# Patient Record
Sex: Female | Born: 1935 | Race: White | Hispanic: No | Marital: Married | Smoking: Never smoker
Health system: Southern US, Community
[De-identification: ages and names within clinical notes are randomized; demographics above are authoritative.]

## PROBLEM LIST (undated history)

## (undated) DIAGNOSIS — R011 Cardiac murmur, unspecified: Secondary | ICD-10-CM

## (undated) DIAGNOSIS — I10 Essential (primary) hypertension: Secondary | ICD-10-CM

## (undated) DIAGNOSIS — E7800 Pure hypercholesterolemia, unspecified: Secondary | ICD-10-CM

## (undated) HISTORY — PX: TOTAL HIP ARTHROPLASTY: SHX124

---

## 2004-09-17 ENCOUNTER — Ambulatory Visit: Payer: Self-pay | Admitting: Internal Medicine

## 2005-09-28 ENCOUNTER — Ambulatory Visit: Payer: Self-pay | Admitting: Internal Medicine

## 2006-10-03 ENCOUNTER — Ambulatory Visit: Payer: Self-pay | Admitting: Internal Medicine

## 2006-10-05 ENCOUNTER — Ambulatory Visit: Payer: Self-pay | Admitting: Internal Medicine

## 2007-05-21 ENCOUNTER — Encounter: Admission: RE | Admit: 2007-05-21 | Discharge: 2007-05-21 | Payer: Self-pay | Admitting: Internal Medicine

## 2007-06-15 HISTORY — PX: TOTAL HIP ARTHROPLASTY: SHX124

## 2007-08-07 ENCOUNTER — Ambulatory Visit: Payer: Self-pay | Admitting: Pain Medicine

## 2007-08-22 ENCOUNTER — Ambulatory Visit: Payer: Self-pay | Admitting: Pain Medicine

## 2007-09-12 ENCOUNTER — Ambulatory Visit: Payer: Self-pay | Admitting: Physician Assistant

## 2007-10-06 ENCOUNTER — Ambulatory Visit: Payer: Self-pay | Admitting: Internal Medicine

## 2008-03-11 ENCOUNTER — Ambulatory Visit: Payer: Self-pay | Admitting: General Practice

## 2008-03-11 ENCOUNTER — Other Ambulatory Visit: Payer: Self-pay

## 2008-03-25 ENCOUNTER — Inpatient Hospital Stay: Payer: Self-pay | Admitting: General Practice

## 2008-10-15 ENCOUNTER — Ambulatory Visit: Payer: Self-pay | Admitting: Internal Medicine

## 2009-10-17 ENCOUNTER — Ambulatory Visit: Payer: Self-pay | Admitting: Internal Medicine

## 2011-02-11 ENCOUNTER — Ambulatory Visit: Payer: Self-pay | Admitting: Internal Medicine

## 2012-03-14 ENCOUNTER — Ambulatory Visit: Payer: Self-pay | Admitting: Internal Medicine

## 2012-07-09 ENCOUNTER — Emergency Department: Payer: Self-pay | Admitting: Unknown Physician Specialty

## 2014-04-22 DIAGNOSIS — Z96641 Presence of right artificial hip joint: Secondary | ICD-10-CM | POA: Insufficient documentation

## 2015-12-25 ENCOUNTER — Emergency Department
Admission: EM | Admit: 2015-12-25 | Discharge: 2015-12-25 | Disposition: A | Discharge status: Home / Self Care | Payer: Medicare Other | Attending: Emergency Medicine | Admitting: Emergency Medicine

## 2015-12-25 ENCOUNTER — Encounter: Payer: Self-pay | Admitting: *Deleted

## 2015-12-25 ENCOUNTER — Emergency Department: Payer: Medicare Other

## 2015-12-25 DIAGNOSIS — W01198A Fall on same level from slipping, tripping and stumbling with subsequent striking against other object, initial encounter: Secondary | ICD-10-CM | POA: Diagnosis not present

## 2015-12-25 DIAGNOSIS — Y999 Unspecified external cause status: Secondary | ICD-10-CM | POA: Insufficient documentation

## 2015-12-25 DIAGNOSIS — Z79899 Other long term (current) drug therapy: Secondary | ICD-10-CM | POA: Insufficient documentation

## 2015-12-25 DIAGNOSIS — S0093XA Contusion of unspecified part of head, initial encounter: Secondary | ICD-10-CM

## 2015-12-25 DIAGNOSIS — Y929 Unspecified place or not applicable: Secondary | ICD-10-CM | POA: Diagnosis not present

## 2015-12-25 DIAGNOSIS — W19XXXA Unspecified fall, initial encounter: Secondary | ICD-10-CM

## 2015-12-25 DIAGNOSIS — S0990XA Unspecified injury of head, initial encounter: Secondary | ICD-10-CM

## 2015-12-25 DIAGNOSIS — S80212A Abrasion, left knee, initial encounter: Secondary | ICD-10-CM | POA: Diagnosis not present

## 2015-12-25 DIAGNOSIS — I10 Essential (primary) hypertension: Secondary | ICD-10-CM | POA: Insufficient documentation

## 2015-12-25 DIAGNOSIS — Z7982 Long term (current) use of aspirin: Secondary | ICD-10-CM | POA: Insufficient documentation

## 2015-12-25 DIAGNOSIS — Y9389 Activity, other specified: Secondary | ICD-10-CM | POA: Insufficient documentation

## 2015-12-25 HISTORY — DX: Essential (primary) hypertension: I10

## 2015-12-25 HISTORY — DX: Pure hypercholesterolemia, unspecified: E78.00

## 2015-12-25 NOTE — ED Notes (Signed)
States she is on 81 mg ASA daily

## 2015-12-25 NOTE — Discharge Instructions (Signed)
Abrasion An abrasion is a cut or scrape on the surface of your skin. An abrasion does not go through all of the layers of your skin. It is important to take good care of your abrasion to prevent infection. HOME CARE Medicines  Take or apply medicines only as told by your doctor.  If you were prescribed an antibiotic ointment, finish all of it even if you start to feel better. Wound Care  Clean the wound with mild soap and water 2-3 times per day or as told by your doctor. Pat your wound dry with a clean towel. Do not rub it.  There are many ways to close and cover a wound. Follow instructions from your doctor about:  How to take care of your wound.  When and how you should change your bandage (dressing).  When and how you should take off your dressing.  Check your wound every day for signs of infection. Watch for:  Redness, swelling, or pain.  Fluid, blood, or pus. General Instructions  Keep the dressing dry as told by your doctor. Do not take baths, swim, use a hot tub, or do anything that would put your wound underwater until your doctor says it is okay.  If there is swelling, raise (elevate) the injured area above the level of your heart while you are sitting or lying down.  Keep all follow-up visits as told by your doctor. This is important. GET HELP IF:  You were given a tetanus shot and you have any of these where the needle went in:  Swelling.  Very bad pain.  Redness.  Bleeding.  Medicine does not help your pain.  You have any of these at the site of the wound:  More redness.  More swelling.  More pain. GET HELP RIGHT AWAY IF:  You have a red streak going away from your wound.  You have a fever.  You have fluid, blood, or pus coming from your wound.  There is a bad smell coming from your wound.   This information is not intended to replace advice given to you by your health care provider. Make sure you discuss any questions you have with your  health care provider.   Document Released: 11/17/2007 Document Revised: 10/15/2014 Document Reviewed: 05/29/2014 Elsevier Interactive Patient Education 2016 [employer]  Contusion A contusion is a deep bruise. Contusions happen when an injury causes bleeding under the skin. Symptoms of bruising include pain, swelling, and discolored skin. The skin may turn blue, purple, or yellow. HOME CARE   Rest the injured area.  If told, put ice on the injured area.  Put ice in a plastic bag.  Place a towel between your skin and the bag.  Leave the ice on for 20 minutes, 2-3 times per day.  If told, put light pressure (compression) on the injured area using an elastic bandage. Make sure the bandage is not too tight. Remove it and put it back on as told by your doctor.  If possible, raise (elevate) the injured area above the level of your heart while you are sitting or lying down.  Take over-the-counter and prescription medicines only as told by your doctor. GET HELP IF:  Your symptoms do not get better after several days of treatment.  Your symptoms get worse.  You have trouble moving the injured area. GET HELP RIGHT AWAY IF:   You have very bad pain.  You have a loss of feeling (numbness) in a hand or foot.  Your hand or foot turns pale or cold.   This information is not intended to replace advice given to you by your health care provider. Make sure you discuss any questions you have with your health care provider.   Document Released: 11/17/2007 Document Revised: 02/19/2015 Document Reviewed: 10/16/2014 Elsevier Interactive Patient Education 2016 [employer]  Facial or Scalp Contusion  A facial or scalp contusion is a deep bruise on the face or head. Contusions happen when an injury causes bleeding under the skin. Signs of bruising include pain, puffiness (swelling), and discolored skin. The contusion may turn blue, purple, or yellow. HOME CARE  Only take medicines as told  by your doctor.  Put ice on the injured area.  Put ice in a plastic bag.  Place a towel between your skin and the bag.  Leave the ice on for 20 minutes, 2-3 times a day. GET HELP IF:  You have bite problems.  You have pain when chewing.  You are worried about your face not healing normally. GET HELP RIGHT AWAY IF:   You have severe pain or a headache and medicine does not help.  You are very tired or confused, or your personality changes.  You throw up (vomit).  You have a nosebleed that will not stop.  You see two of everything (double vision) or have blurry vision.  You have fluid coming from your nose or ear.  You have problems walking or using your arms or legs. MAKE SURE YOU:   Understand these instructions.  Will watch your condition.  Will get help right away if you are not doing well or get worse.   This information is not intended to replace advice given to you by your health care provider. Make sure you discuss any questions you have with your health care provider.   Document Released: 05/20/2011 Document Revised: 06/21/2014 Document Reviewed: 01/11/2013 Elsevier Interactive Patient Education 2016 [employer]  Cryotherapy Cryotherapy is when you put ice on your injury. Ice helps lessen pain and puffiness (swelling) after an injury. Ice works the best when you start using it in the first 24 to 48 hours after an injury. HOME CARE  Put a dry or damp towel between the ice pack and your skin.  You may press gently on the ice pack.  Leave the ice on for no more than 10 to 20 minutes at a time.  Check your skin after 5 minutes to make sure your skin is okay.  Rest at least 20 minutes between ice pack uses.  Stop using ice when your skin loses feeling (numbness).  Do not use ice on someone who cannot tell you when it hurts. This includes small children and people with memory problems (dementia). GET HELP RIGHT AWAY IF:  You have white spots on your  skin.  Your skin turns blue or pale.  Your skin feels waxy or hard.  Your puffiness gets worse. MAKE SURE YOU:   Understand these instructions.  Will watch your condition.  Will get help right away if you are not doing well or get worse.   This information is not intended to replace advice given to you by your health care provider. Make sure you discuss any questions you have with your health care provider.   Document Released: 11/17/2007 Document Revised: 08/23/2011 Document Reviewed: 01/21/2011 Elsevier Interactive Patient Education 2016 [employer]  Head Injury, Adult You have a head injury. Headaches and throwing up (vomiting) are common after a head injury.  It should be easy to wake up from sleeping. Sometimes you must stay in the hospital. Most problems happen within the first 24 hours. Side effects may occur up to 7-10 days after the injury.  WHAT ARE THE TYPES OF HEAD INJURIES? Head injuries can be as minor as a bump. Some head injuries can be more severe. More severe head injuries include:  A jarring injury to the brain (concussion).  A bruise of the brain (contusion). This mean there is bleeding in the brain that can cause swelling.  A cracked skull (skull fracture).  Bleeding in the brain that collects, clots, and forms a bump (hematoma). WHEN SHOULD I GET HELP RIGHT AWAY?   You are confused or sleepy.  You cannot be woken up.  You feel sick to your stomach (nauseous) or keep throwing up (vomiting).  Your dizziness or unsteadiness is getting worse.  You have very bad, lasting headaches that are not helped by medicine. Take medicines only as told by your doctor.  You cannot use your arms or legs like normal.  You cannot walk.  You notice changes in the black spots in the center of the colored part of your eye (pupil).  You have clear or bloody fluid coming from your nose or ears.  You have trouble seeing. During the next 24 hours after the injury, you  must stay with someone who can watch you. This person should get help right away (call 911 in the [place] if you start to shake and are not able to control it (have seizures), you pass out, or you are unable to wake up. HOW CAN I PREVENT A HEAD INJURY IN THE FUTURE?  Wear seat belts.  Wear a helmet while bike riding and playing sports like football.  Stay away from dangerous activities around the house. WHEN CAN I RETURN TO NORMAL ACTIVITIES AND ATHLETICS? See your doctor before doing these activities. You should not do normal activities or play contact sports until 1 week after the following symptoms have stopped:  Headache that does not go away.  Dizziness.  Poor attention.  Confusion.  Memory problems.  Sickness to your stomach or throwing up.  Tiredness.  Fussiness.  Bothered by bright lights or loud noises.  Anxiousness or depression.  Restless sleep. MAKE SURE YOU:   Understand these instructions.  Will watch your condition.  Will get help right away if you are not doing well or get worse.   This information is not intended to replace advice given to you by your health care provider. Make sure you discuss any questions you have with your health care provider.   Document Released: 05/13/2008 Document Revised: 06/21/2014 Document Reviewed: 02/05/2013 Elsevier Interactive Patient Education [employer]

## 2015-12-25 NOTE — ED Notes (Signed)
See triage note. States she tripped and fell hit head on door frame  No  Loc  Small hematoma noted   Also abrasion noted to left knee

## 2015-12-25 NOTE — ED Provider Notes (Signed)
[facility] Emergency Department Provider Note  ____________________________________________  Time seen: Approximately 10:49 AM  I have reviewed the triage vital signs and the nursing notes.   HISTORY  Chief Complaint Fall and Head Injury    HPI Judy [name] is a 82 y.o. female , NAD, presents to the emergency department accompanied by her husband who assists with history. Patient states she tripped over some objects in the floor, fell backwards and struck the door frame with the top of her head. States she fell on her left side has had some left hip pain along with some left knee pain with an abrasion. 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 She was able to get up from the ground with some assistance and has been able to ambulate with little pain. Denies any neck or back pain. Has not had any saddle paresthesias or loss of bowel or bladder control. Denies LOC, dizziness, visual changes, chest pain, palpitations, shortness of breath, abdominal pain, nausea, vomiting. Has not noted any active bleeding or open wounds. States she did take Advil prior to eating her breakfast this morning for aching in her hips which can be a common occurrence.   Past Medical History  Diagnosis Date  . Hypertension   . High cholesterol     There are no active problems to display for this patient.   Past Surgical History  Procedure Laterality Date  . Total hip arthroplasty      Current Outpatient Rx  Name  Route  Sig  Dispense  Refill  . aspirin 81 MG tablet   Oral   Take 81 mg by mouth daily.         . benzonatate (TESSALON) 100 MG capsule   Oral   Take 100 mg by mouth 3 (three) times daily as needed for cough.         . loratadine (CLARITIN) 10 MG tablet   Oral   Take 10 mg by mouth daily.         . metoprolol succinate (TOPROL-XL) 25 MG 24 hr tablet   Oral   Take 25 mg  by mouth daily.         Marland Kitchen oxybutynin (DITROPAN) 5 MG tablet   Oral   Take 5 mg by mouth 3 (three) times daily.         . pantoprazole (PROTONIX) 40 MG tablet   Oral   Take 40 mg by mouth daily.         . simvastatin (ZOCOR) 20 MG tablet   Oral   Take 20 mg by mouth daily.         . valsartan-hydrochlorothiazide (DIOVAN-HCT) 80-12.5 MG tablet   Oral   Take 1 tablet by mouth daily.         . Vitamin D, Ergocalciferol, (DRISDOL) 50000 units CAPS capsule   Oral   Take 50,000 Units by mouth every 7 (seven) days.           Allergies Review of patient's allergies indicates no known allergies.  History reviewed. No pertinent family history.  Social History Social History  Substance Use Topics  . Smoking status: Never Smoker   . Smokeless tobacco: None  . Alcohol Use: No     Review of Systems  Constitutional: No fever/chills, fatigue Eyes: No visual changes.  Cardiovascular: No chest pain, Palpitations. Respiratory: No shortness of breath. No  wheezing.  Gastrointestinal: No abdominal pain.  No nausea, vomiting.   Musculoskeletal: Positive bilateral aching in hips, left knee pain. Negative for back, Neck pain.  Skin: Contusion head. Negative for rash. Neurological: Positive head injury. Negative for headaches, focal weakness or numbness. No LOC, dizziness. No tingling, saddle paresthesias, loss of bowel or bladder control 10-point ROS otherwise negative.  ____________________________________________   PHYSICAL EXAM:  VITAL SIGNS: ED Triage Vitals  Enc Vitals Group     BP 12/25/15 1031 154/102 mmHg     Pulse Rate 12/25/15 1031 93     Resp 12/25/15 1031 18     Temp 12/25/15 1031 97.5 F (36.4 C)     Temp Source 12/25/15 1031 Oral     SpO2 12/25/15 1031 95 %     Weight 12/25/15 1031 160 lb (72.576 kg)     Height 12/25/15 1031 5\' 7"  (1.702 m)     Head Cir --      Peak Flow --      Pain Score --      Pain Loc --      Pain Edu? --      Excl. in GC?  --      Constitutional: Alert and oriented. Well appearing and in no acute distress. Eyes: Conjunctivae are normalWithout icterus or injection. PERRLA. EOMI without pain.  Head: Normocephalic. ENT:      Ears: No discharge noted from bilateral ear canals.      Nose: No congestion/rhinnorhea.      Mouth/Throat: Mucous membranes are moist.  Neck: No cervical spine tenderness to palpation. Supple with full range of motion. Hematological/Lymphatic/Immunilogical: No cervical lymphadenopathy. Cardiovascular: Normal rate, regular rhythm. Grossly normal heart sounds. Good peripheral circulation with 2+ pulses noted in bilateral upper and lower extremities. Respiratory: Normal respiratory effort without tachypnea or retractions. Lungs CTAB with breath sounds noted in all lung fields. Musculoskeletal: Full range of motion of the left knee without significant pain. No laxity with anterior or posterior drawer. Grossly normal range of motion of bilateral hips without pain. No lower extremity tenderness nor edema.  No joint effusions. Neurologic:  Normal speech/language. No gross focal neurologic deficits are appreciated. CN III-XII grossly in tact.  Skin:  Superficial abrasion noted to the anterior left knee with out active bleeding. Contusion to the top of the scalp with congruent oblong hematoma without any open wounds or lacerations. All tenderness to palpation. Skin is warm, dry and intact. No rash noted. Psychiatric: Mood and affect are normal. Speech and behavior are normal. Patient exhibits appropriate insight and judgement.   ____________________________________________   LABS  None ____________________________________________  EKG  None ____________________________________________  RADIOLOGY I have personally viewed and evaluated these images (plain radiographs) as part of my medical decision making, as well as reviewing the written report by the radiologist.  Ct Head Wo  Contrast  12/25/2015  CLINICAL DATA:  Tripped and fell, hit head on door frame EXAM: CT HEAD WITHOUT CONTRAST TECHNIQUE: Contiguous axial images were obtained from the base of the skull through the vertex without intravenous contrast. COMPARISON:  07/09/2012 FINDINGS: No intracranial hemorrhage, mass effect or midline shift. No acute cortical infarction. No mass lesion is noted on this unenhanced scan. Atherosclerotic calcifications of carotid siphon. No skull fracture is noted. Paranasal sinuses and mastoid air cells are unremarkable. Mild cerebral atrophy. Mild periventricular white matter decreased attenuation probable due to chronic small vessel ischemic changes. IMPRESSION: No acute intracranial abnormality. No definite acute cortical infarction. Mild cerebral atrophy. Mild  periventricular white matter decreased attenuation probable due to chronic small vessel ischemic changes. Atherosclerotic calcifications of carotid siphon. Electronically Signed   By: Natasha MeadLiviu  Pop M.D.   On: 12/25/2015 11:15   Dg Knee Complete 4 Views Left  12/25/2015  CLINICAL DATA:  Fall with left knee pain, initial encounter EXAM: LEFT KNEE - COMPLETE 4+ VIEW COMPARISON:  None. FINDINGS: Mild degenerative changes are noted in all 3 joint compartments. No joint effusion is seen. No acute fracture or dislocation is noted. No soft tissue abnormality is seen. IMPRESSION: Degenerative changes without acute abnormality. Electronically Signed   By: Alcide CleverMark  Lukens M.D.   On: 12/25/2015 11:39   Dg Hip Unilat With Pelvis 2-3 Views Left  12/25/2015  CLINICAL DATA:  Fall with left hip pain, initial encounter EXAM: DG HIP (WITH OR WITHOUT PELVIS) 2-3V LEFT COMPARISON:  None. FINDINGS: Right hip prosthesis is noted. The pelvic ring is intact. No acute fracture or dislocation is seen. No soft tissue abnormality is noted. IMPRESSION: No acute abnormality seen. Electronically Signed   By: Alcide CleverMark  Lukens M.D.   On: 12/25/2015 11:41     ____________________________________________    PROCEDURES  Procedure(s) performed: None    Medications - No data to display   ____________________________________________   INITIAL IMPRESSION / ASSESSMENT AND PLAN / ED COURSE  Pertinent imaging results that were available during my care of the patient were reviewed by me and considered in my medical decision making (see chart for details).  Patient's diagnosis is consistent with head injury, traumatic hematoma of head, abrasion of left knee due to fall. Patient will be discharged home with detailed instructions for home care included in the exit care instructions. Patient may apply ice to the affected areas 20 minutes 3-4 times daily as needed. May cleanse abrasion with warm soapy water daily as needed. Patient is to follow up with her primary care provider in 48 hours if symptoms persist past this treatment course. Patient is given ED precautions to return to the ED for any worsening or new symptoms.    ____________________________________________  FINAL CLINICAL IMPRESSION(S) / ED DIAGNOSES  Final diagnoses:  Head injury, initial encounter  Traumatic hematoma of head, initial encounter  Abrasion of left knee, initial encounter  Fall, initial encounter      NEW MEDICATIONS STARTED DURING THIS VISIT:  New Prescriptions   No medications on file         Hope PigeonJami L Kendrah Lovern, PA-C 12/25/15 1146  Emily FilbertJonathan E Williams, MD 12/25/15 1251

## 2015-12-25 NOTE — ED Notes (Signed)
Tripped and fell striking door frame with head , hematoma (top of scalp) abrasion to left knee

## 2019-02-22 ENCOUNTER — Other Ambulatory Visit: Payer: Self-pay | Admitting: Otolaryngology

## 2019-02-22 DIAGNOSIS — R42 Dizziness and giddiness: Secondary | ICD-10-CM

## 2019-03-01 ENCOUNTER — Other Ambulatory Visit: Payer: Self-pay

## 2019-03-01 ENCOUNTER — Ambulatory Visit
Admission: RE | Admit: 2019-03-01 | Discharge: 2019-03-01 | Disposition: A | Discharge status: Home / Self Care | Payer: Medicare Other | Source: Ambulatory Visit | Attending: Otolaryngology | Admitting: Otolaryngology

## 2019-03-01 DIAGNOSIS — R42 Dizziness and giddiness: Secondary | ICD-10-CM | POA: Insufficient documentation

## 2019-08-09 DIAGNOSIS — N1831 Chronic kidney disease, stage 3a: Secondary | ICD-10-CM | POA: Insufficient documentation

## 2019-08-09 DIAGNOSIS — I10 Essential (primary) hypertension: Secondary | ICD-10-CM | POA: Insufficient documentation

## 2019-10-12 ENCOUNTER — Other Ambulatory Visit: Payer: Self-pay

## 2019-10-12 ENCOUNTER — Emergency Department: Payer: Medicare Other

## 2019-10-12 ENCOUNTER — Emergency Department
Admission: EM | Admit: 2019-10-12 | Discharge: 2019-10-12 | Disposition: A | Discharge status: Other Acute Inpatient Hospital | Payer: Medicare Other | Attending: Emergency Medicine | Admitting: Emergency Medicine

## 2019-10-12 ENCOUNTER — Encounter: Payer: Self-pay | Admitting: Emergency Medicine

## 2019-10-12 DIAGNOSIS — I10 Essential (primary) hypertension: Secondary | ICD-10-CM | POA: Diagnosis not present

## 2019-10-12 DIAGNOSIS — Y92002 Bathroom of unspecified non-institutional (private) residence single-family (private) house as the place of occurrence of the external cause: Secondary | ICD-10-CM | POA: Diagnosis not present

## 2019-10-12 DIAGNOSIS — Y999 Unspecified external cause status: Secondary | ICD-10-CM | POA: Insufficient documentation

## 2019-10-12 DIAGNOSIS — Z79899 Other long term (current) drug therapy: Secondary | ICD-10-CM | POA: Insufficient documentation

## 2019-10-12 DIAGNOSIS — Y9389 Activity, other specified: Secondary | ICD-10-CM | POA: Diagnosis not present

## 2019-10-12 DIAGNOSIS — S0990XA Unspecified injury of head, initial encounter: Secondary | ICD-10-CM | POA: Diagnosis present

## 2019-10-12 DIAGNOSIS — Z96649 Presence of unspecified artificial hip joint: Secondary | ICD-10-CM | POA: Diagnosis not present

## 2019-10-12 DIAGNOSIS — W0110XA Fall on same level from slipping, tripping and stumbling with subsequent striking against unspecified object, initial encounter: Secondary | ICD-10-CM | POA: Insufficient documentation

## 2019-10-12 DIAGNOSIS — S065X9A Traumatic subdural hemorrhage with loss of consciousness of unspecified duration, initial encounter: Secondary | ICD-10-CM

## 2019-10-12 DIAGNOSIS — S065XAA Traumatic subdural hemorrhage with loss of consciousness status unknown, initial encounter: Secondary | ICD-10-CM

## 2019-10-12 DIAGNOSIS — Z7982 Long term (current) use of aspirin: Secondary | ICD-10-CM | POA: Insufficient documentation

## 2019-10-12 DIAGNOSIS — S065X1A Traumatic subdural hemorrhage with loss of consciousness of 30 minutes or less, initial encounter: Secondary | ICD-10-CM | POA: Insufficient documentation

## 2019-10-12 HISTORY — DX: Traumatic subdural hemorrhage with loss of consciousness of unspecified duration, initial encounter: S06.5X9A

## 2019-10-12 HISTORY — DX: Traumatic subdural hemorrhage with loss of consciousness status unknown, initial encounter: S06.5XAA

## 2019-10-12 LAB — URINALYSIS, COMPLETE (UACMP) WITH MICROSCOPIC
Bacteria, UA: NONE SEEN
Bilirubin Urine: NEGATIVE
Glucose, UA: NEGATIVE mg/dL
Hgb urine dipstick: NEGATIVE
Ketones, ur: NEGATIVE mg/dL
Leukocytes,Ua: NEGATIVE
Nitrite: NEGATIVE
Protein, ur: NEGATIVE mg/dL
Specific Gravity, Urine: 1.01 (ref 1.005–1.030)
Squamous Epithelial / LPF: NONE SEEN (ref 0–5)
pH: 6 (ref 5.0–8.0)

## 2019-10-12 LAB — CBC
HCT: 39.5 % (ref 36.0–46.0)
Hemoglobin: 13.5 g/dL (ref 12.0–15.0)
MCH: 30.6 pg (ref 26.0–34.0)
MCHC: 34.2 g/dL (ref 30.0–36.0)
MCV: 89.6 fL (ref 80.0–100.0)
Platelets: 295 10*3/uL (ref 150–400)
RBC: 4.41 MIL/uL (ref 3.87–5.11)
RDW: 13.8 % (ref 11.5–15.5)
WBC: 10.3 10*3/uL (ref 4.0–10.5)
nRBC: 0 % (ref 0.0–0.2)

## 2019-10-12 LAB — BASIC METABOLIC PANEL
Anion gap: 13 (ref 5–15)
BUN: 20 mg/dL (ref 8–23)
CO2: 18 mmol/L — ABNORMAL LOW (ref 22–32)
Calcium: 9.3 mg/dL (ref 8.9–10.3)
Chloride: 103 mmol/L (ref 98–111)
Creatinine, Ser: 1.05 mg/dL — ABNORMAL HIGH (ref 0.44–1.00)
GFR calc Af Amer: 54 mL/min — ABNORMAL LOW (ref >60)
GFR calc non Af Amer: 46 mL/min — ABNORMAL LOW (ref >60)
Glucose, Bld: 106 mg/dL — ABNORMAL HIGH (ref 70–99)
Potassium: 4.2 mmol/L (ref 3.5–5.1)
Sodium: 134 mmol/L — ABNORMAL LOW (ref 135–145)

## 2019-10-12 MED ORDER — LEVETIRACETAM IN NACL 1000 MG/100ML IV SOLN
1000.0000 mg | Freq: Once | INTRAVENOUS | Status: AC
  Administered 2019-10-12: 18:00:00 1000 mg via INTRAVENOUS
  Filled 2019-10-12: qty 100

## 2019-10-12 MED ORDER — LABETALOL HCL 5 MG/ML IV SOLN
5.0000 mg | Freq: Once | INTRAVENOUS | Status: AC
  Administered 2019-10-12: 17:00:00 5 mg via INTRAVENOUS
  Filled 2019-10-12: qty 4

## 2019-10-12 NOTE — ED Notes (Signed)
Patient reports stood up from sitting position and synopsized. Presents with hematoma to right side of her head, pain to right elbow and right hip pain.

## 2019-10-12 NOTE — ED Triage Notes (Signed)
Pt presents to ED via POV c/o syncopal episode. Pt states she was sitting and got up to check on her husband in the shower and next thing she remembers she was on the floor. Pt is A&Ox4, walks with cane. Hematoma noted to R forehead and c/o 4/10 headache and R hip pain. Takes 81mg  ASA.

## 2019-10-12 NOTE — ED Notes (Signed)
Pt up to BR at this time with cane.

## 2019-10-12 NOTE — ED Provider Notes (Signed)
[facility] Emergency Department Provider Note ____________________________________________   First MD Initiated Contact with Patient 10/12/19 1558     (approximate)  I have reviewed the triage vital signs and the nursing notes.   HISTORY  Chief Complaint Loss of Consciousness    HPI STACIA FEAZELL is a 86 y.o. female with PMH as noted below who presents with a head injury today, acute onset when the patient was helping her husband in the shower.  The patient states that she stood up from sitting, briefly lost consciousness, and fell to the floor.  She hit the right front of her head.  She also hit her right hip, although was able to get up and ambulate.  She reports local pain to the right forehead, but denies severe headache, any nausea or vomiting, vision changes, weakness or numbness, or other acute symptoms.  She states that about 2 weeks ago she had a mechanical fall in which she fell backwards and hit the back of her head, although she did not lose consciousness at that time.  The patient is on aspirin, but denies anticoagulation.  She states she was recently discontinued off of her blood pressure medications.  Past Medical History:  Diagnosis Date  . High cholesterol   . Hypertension     There are no problems to display for this patient.   Past Surgical History:  Procedure Laterality Date  . TOTAL HIP ARTHROPLASTY      Prior to Admission medications   Medication Sig Start Date End Date Taking? Authorizing Provider  aspirin 81 MG tablet Take 81 mg by mouth daily.   Yes [provider]  benzonatate (TESSALON) 100 MG capsule Take 100 mg by mouth 3 (three) times daily as needed for cough.   Yes [provider]  Calcium Carbonate 500 MG CHEW Chew 500 mg by mouth 2 (two) times daily.   Yes [provider]  cholecalciferol (VITAMIN D3) 25 MCG (1000 UNIT) tablet Take 1,000 Units by mouth daily.   Yes [provider]  loratadine (CLARITIN) 10 MG tablet Take 10 mg by mouth daily.   Yes [provider]  meloxicam (MOBIC) 7.5 MG tablet Take 7.5 mg by mouth daily. 10/02/19  Yes [provider]  metoprolol succinate (TOPROL-XL) 25 MG 24 hr tablet Take 25 mg by mouth daily.   Yes [provider]  oxybutynin (DITROPAN) 5 MG tablet Take 5 mg by mouth 2 (two) times daily.    Yes [provider]  pantoprazole (PROTONIX) 40 MG tablet Take 40 mg by mouth 2 (two) times daily.    Yes [provider]  vitamin E (VITAMIN E) 180 MG (400 UNITS) capsule Take 400 Units by mouth daily.   Yes [provider]    Allergies Patient has no known allergies.  History reviewed. No pertinent family history.  Social History Social History   Tobacco Use  . Smoking status: Never Smoker  . Smokeless tobacco: Never Used  Substance Use Topics  . Alcohol use: No  . Drug use: Not on file    Review of Systems  Constitutional: No fever. Eyes: No visual changes. ENT: No neck pain. Cardiovascular: Denies chest pain. Respiratory: Denies shortness of breath. Gastrointestinal: No nausea or vomiting. Genitourinary: Negative for flank pain.  Musculoskeletal: Negative for back pain.  Positive for right hip pain. Skin: Negative for rash. Neurological: Negative for headaches, focal weakness or numbness.   ____________________________________________   PHYSICAL EXAM:  VITAL SIGNS:  ED Triage Vitals  Enc Vitals Group     BP 10/12/19 1457 (!) 194/81     Pulse Rate 10/12/19 1457 71     Resp 10/12/19 1457 18     Temp 10/12/19 1457 98.3 F (36.8 C)     Temp Source 10/12/19 1457 Oral     SpO2 10/12/19 1457 95 %     Weight 10/12/19 1459 153 lb (69.4 kg)     Height 10/12/19 1459 5\' 7"  (1.702 m)     Head Circumference --      Peak Flow --      Pain Score 10/12/19 1459 4     Pain Loc --      Pain Edu? --      Excl. in GC? --     Constitutional: Alert and  oriented. Well appearing for age and in no acute distress. Eyes: Conjunctivae are normal.  EOMI.  PERRLA. Head: Approximately 10 cm right forehead ecchymosis. Nose: No congestion/rhinnorhea. Mouth/Throat: Mucous membranes are moist.   Neck: Normal range of motion.  No midline cervical spinal tenderness. Cardiovascular: Normal rate, regular rhythm.  Good peripheral circulation. Respiratory: Normal respiratory effort.  No retractions.  Gastrointestinal: Soft and nontender. No distention.  Genitourinary: No flank tenderness. Musculoskeletal: No lower extremity edema.  Extremities warm and well perfused.  Full range of motion to the right hip with no significant tenderness or deformity. Neurologic:  Normal speech and language.  5/5 motor strength and intact sensation to bilateral upper and lower extremities.  Normal coordination with no ataxia.  No pronator drift.  No facial droop. Skin:  Skin is warm and dry. No rash noted. Psychiatric: Mood and affect are normal. Speech and behavior are normal.  ____________________________________________   LABS (all labs ordered are listed, but only abnormal results are displayed)  Labs Reviewed  BASIC METABOLIC PANEL - Abnormal; Notable for the following components:      Result Value   Sodium 134 (*)    CO2 18 (*)    Glucose, Bld 106 (*)    Creatinine, Ser 1.05 (*)    GFR calc non Af Amer 46 (*)    GFR calc Af Amer 54 (*)    All other components within normal limits  URINALYSIS, COMPLETE (UACMP) WITH MICROSCOPIC - Abnormal; Notable for the following components:   Color, Urine YELLOW (*)    APPearance CLEAR (*)    All other components within normal limits  CBC   ____________________________________________  EKG  ED ECG REPORT I, 10/14/19, the attending physician, personally viewed and interpreted this ECG.  Date: 10/12/2019 EKG Time: 1501 Rate: 73 Rhythm: normal sinus rhythm QRS Axis: normal Intervals: RBBB, LAFB ST/T Wave  abnormalities: Nonspecific abnormalities Narrative Interpretation: no evidence of acute ischemia; no recent prior EKG available for comparison  ____________________________________________  RADIOLOGY  CT head 4 PM: 7 mm right parieto-occipital subdural hematoma with no midline shift or mass-effect CT head 10 PM: Subdural hematoma increased to 9 mm  ____________________________________________   PROCEDURES  Procedure(s) performed: No  Procedures  Critical Care performed: Yes  CRITICAL CARE Performed by: [id number]   Total critical care time: 30 minutes  Critical care time was exclusive of separately billable procedures and treating other patients.  Critical care was necessary to treat or prevent imminent or life-threatening deterioration.  Critical care was time spent personally by me on the following activities: development of treatment plan with patient and/or surrogate as well as nursing, discussions with consultants, evaluation of patient's  response to treatment, examination of patient, obtaining history from patient or surrogate, ordering and performing treatments and interventions, ordering and review of laboratory studies, ordering and review of radiographic studies, pulse oximetry and re-evaluation of patient's condition. ____________________________________________   INITIAL IMPRESSION / ASSESSMENT AND PLAN / ED COURSE  Pertinent labs & imaging results that were available during my care of the patient were reviewed by me and considered in my medical decision making (see chart for details).  86 year old female with PMH as noted above presents after an episode of syncope resulting in a fall from standing height and injury to the right forehead.  She also had a recent fall a few weeks ago hitting the back of her head.  On exam, she is overall well-appearing.  She is alert and oriented.  Her vital signs are normal except for hypertension.  Neurologic exam is  nonfocal.  CT reveals an acute appearing 7 mm subdural hematoma to the right parieto-occipital region.  This actually would be somewhat more consistent with the patient's prior fall, but appears acute on the CT.  We will obtain lab work-up, observe the patient, and I will consult neurosurgery for further recommendations.  ----------------------------------------- 5:43 PM on 10/12/2019 -----------------------------------------  I discussed the case with neurosurgery at [facility] who reviewed the images.  She recommended that we observe the patient for 6 hours and repeat a CT.  If there is no significant change, the patient would be appropriate for discharge home and outpatient follow-up.  She also recommended that we start seizure prophylaxis with Keppra and have the patient discontinue aspirin for at least a week.  The patient is comfortable with this plan.  We will plan to observe until 10 PM and then repeat the CT.  ----------------------------------------- 12:01 AM on 10/13/2019 -----------------------------------------  Repeat CT shows increase in size of the hematoma to 9 mm.  The patient continues to have a normal mental status and no new symptoms.  Due to the worsening hematoma, the patient consented to transfer to a tertiary center with neurosurgical availability.  I contacted the [facility] transfer center, and the patient was accepted for transfer by Dr. Wynn Banker.  The patient was stable at the time of transfer.  ____________________________________________   FINAL CLINICAL IMPRESSION(S) / ED DIAGNOSES  Final diagnoses:  Subdural hematoma (HCC)      NEW MEDICATIONS STARTED DURING THIS VISIT:  Discharge Medication List as of 10/12/2019 11:47 PM       Note:  This document was prepared using Dragon voice recognition software and may include unintentional dictation errors.   Dionne Bucy, MD 10/13/19 0002

## 2019-10-12 NOTE — ED Provider Notes (Addendum)
-----------------------------------------   3:55 PM on 10/12/2019 -----------------------------------------  I received verbal report from the radiologist that the CT demonstrates a subdural hematoma.  I called the triage RN to have the patient brought back to a room immediately.       Dionne Bucy, MD 10/12/19 1555

## 2019-10-12 NOTE — ED Notes (Signed)
Pt to CT at this time.

## 2019-10-12 NOTE — ED Notes (Signed)
MD Siadecki at bedside 

## 2019-10-12 NOTE — ED Notes (Signed)
[facility] called for Neurosurgery consult

## 2019-10-18 ENCOUNTER — Emergency Department
Admission: EM | Admit: 2019-10-18 | Discharge: 2019-10-18 | Disposition: A | Discharge status: Home / Self Care | Payer: Medicare Other | Attending: Emergency Medicine | Admitting: Emergency Medicine

## 2019-10-18 ENCOUNTER — Other Ambulatory Visit: Payer: Self-pay

## 2019-10-18 ENCOUNTER — Encounter: Payer: Self-pay | Admitting: Emergency Medicine

## 2019-10-18 ENCOUNTER — Emergency Department: Payer: Medicare Other

## 2019-10-18 DIAGNOSIS — Z79899 Other long term (current) drug therapy: Secondary | ICD-10-CM | POA: Diagnosis not present

## 2019-10-18 DIAGNOSIS — S065X9D Traumatic subdural hemorrhage with loss of consciousness of unspecified duration, subsequent encounter: Secondary | ICD-10-CM | POA: Diagnosis present

## 2019-10-18 DIAGNOSIS — Z96649 Presence of unspecified artificial hip joint: Secondary | ICD-10-CM | POA: Diagnosis not present

## 2019-10-18 DIAGNOSIS — R4781 Slurred speech: Secondary | ICD-10-CM | POA: Insufficient documentation

## 2019-10-18 DIAGNOSIS — I10 Essential (primary) hypertension: Secondary | ICD-10-CM | POA: Diagnosis not present

## 2019-10-18 DIAGNOSIS — Z7982 Long term (current) use of aspirin: Secondary | ICD-10-CM | POA: Diagnosis not present

## 2019-10-18 DIAGNOSIS — R531 Weakness: Secondary | ICD-10-CM | POA: Diagnosis not present

## 2019-10-18 DIAGNOSIS — X58XXXD Exposure to other specified factors, subsequent encounter: Secondary | ICD-10-CM | POA: Insufficient documentation

## 2019-10-18 DIAGNOSIS — R791 Abnormal coagulation profile: Secondary | ICD-10-CM | POA: Diagnosis not present

## 2019-10-18 LAB — CBC WITH DIFFERENTIAL/PLATELET
Abs Immature Granulocytes: 0.04 10*3/uL (ref 0.00–0.07)
Basophils Absolute: 0 10*3/uL (ref 0.0–0.1)
Basophils Relative: 0 %
Eosinophils Absolute: 0.1 10*3/uL (ref 0.0–0.5)
Eosinophils Relative: 2 %
HCT: 37.7 % (ref 36.0–46.0)
Hemoglobin: 12.8 g/dL (ref 12.0–15.0)
Immature Granulocytes: 1 %
Lymphocytes Relative: 16 %
Lymphs Abs: 1.4 10*3/uL (ref 0.7–4.0)
MCH: 30.9 pg (ref 26.0–34.0)
MCHC: 34 g/dL (ref 30.0–36.0)
MCV: 91.1 fL (ref 80.0–100.0)
Monocytes Absolute: 0.9 10*3/uL (ref 0.1–1.0)
Monocytes Relative: 11 %
Neutro Abs: 5.9 10*3/uL (ref 1.7–7.7)
Neutrophils Relative %: 70 %
Platelets: 336 10*3/uL (ref 150–400)
RBC: 4.14 MIL/uL (ref 3.87–5.11)
RDW: 13.4 % (ref 11.5–15.5)
WBC: 8.4 10*3/uL (ref 4.0–10.5)
nRBC: 0 % (ref 0.0–0.2)

## 2019-10-18 LAB — BASIC METABOLIC PANEL
Anion gap: 10 (ref 5–15)
BUN: 22 mg/dL (ref 8–23)
CO2: 24 mmol/L (ref 22–32)
Calcium: 9.7 mg/dL (ref 8.9–10.3)
Chloride: 103 mmol/L (ref 98–111)
Creatinine, Ser: 1.03 mg/dL — ABNORMAL HIGH (ref 0.44–1.00)
GFR calc Af Amer: 55 mL/min — ABNORMAL LOW (ref >60)
GFR calc non Af Amer: 47 mL/min — ABNORMAL LOW (ref >60)
Glucose, Bld: 120 mg/dL — ABNORMAL HIGH (ref 70–99)
Potassium: 3.4 mmol/L — ABNORMAL LOW (ref 3.5–5.1)
Sodium: 137 mmol/L (ref 135–145)

## 2019-10-18 LAB — APTT: aPTT: 28 seconds (ref 24–36)

## 2019-10-18 LAB — PROTIME-INR
INR: 1 (ref 0.8–1.2)
Prothrombin Time: 12.6 seconds (ref 11.4–15.2)

## 2019-10-18 LAB — TROPONIN I (HIGH SENSITIVITY): Troponin I (High Sensitivity): 10 ng/L (ref <18)

## 2019-10-18 NOTE — ED Notes (Signed)
Pt being transported to CT

## 2019-10-18 NOTE — Discharge Instructions (Addendum)
Your workup in the Emergency Department today was reassuring.  We did not find any specific abnormalities.  We recommend you drink plenty of fluids, take your regular medications and/or any new ones prescribed today, and follow up with the doctor(s) listed in these documents as recommended.  Return to the Emergency Department if you develop new or worsening symptoms that concern you.  

## 2019-10-18 NOTE — ED Triage Notes (Signed)
Pt to triage via w/c with no distress noted, mask in place; pt seen Friday post fall and was tx to [facility] for head bleed; returning tonight for slurred speech and "dropping everything" since about 8pm
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
## 2019-11-12 DIAGNOSIS — S065XAA Traumatic subdural hemorrhage with loss of consciousness status unknown, initial encounter: Secondary | ICD-10-CM | POA: Insufficient documentation

## 2020-01-14 ENCOUNTER — Encounter: Payer: Self-pay | Admitting: Ophthalmology

## 2020-01-17 ENCOUNTER — Other Ambulatory Visit: Payer: Self-pay

## 2020-01-17 ENCOUNTER — Other Ambulatory Visit
Admission: RE | Admit: 2020-01-17 | Discharge: 2020-01-17 | Disposition: A | Discharge status: Home / Self Care | Payer: Medicare Other | Source: Ambulatory Visit | Attending: Ophthalmology | Admitting: Ophthalmology

## 2020-01-17 DIAGNOSIS — Z20822 Contact with and (suspected) exposure to covid-19: Secondary | ICD-10-CM | POA: Diagnosis not present

## 2020-01-17 DIAGNOSIS — Z01812 Encounter for preprocedural laboratory examination: Secondary | ICD-10-CM | POA: Insufficient documentation

## 2020-01-17 LAB — SARS CORONAVIRUS 2 (TAT 6-24 HRS): SARS Coronavirus 2: NEGATIVE

## 2020-01-17 NOTE — Discharge Instructions (Signed)

## 2020-01-21 ENCOUNTER — Other Ambulatory Visit: Payer: Self-pay

## 2020-01-21 ENCOUNTER — Encounter: Payer: Self-pay | Admitting: Ophthalmology

## 2020-01-21 ENCOUNTER — Encounter
Admission: RE | Disposition: A | Discharge status: Home / Self Care | Payer: Self-pay | Source: Home / Self Care | Attending: Ophthalmology

## 2020-01-21 ENCOUNTER — Ambulatory Visit
Admission: RE | Admit: 2020-01-21 | Discharge: 2020-01-21 | Disposition: A | Discharge status: Home / Self Care | Payer: Medicare Other | Attending: Ophthalmology | Admitting: Ophthalmology

## 2020-01-21 ENCOUNTER — Ambulatory Visit: Payer: Medicare Other | Admitting: Anesthesiology

## 2020-01-21 DIAGNOSIS — Z96641 Presence of right artificial hip joint: Secondary | ICD-10-CM | POA: Insufficient documentation

## 2020-01-21 DIAGNOSIS — Z79899 Other long term (current) drug therapy: Secondary | ICD-10-CM | POA: Diagnosis not present

## 2020-01-21 DIAGNOSIS — Z791 Long term (current) use of non-steroidal anti-inflammatories (NSAID): Secondary | ICD-10-CM | POA: Diagnosis not present

## 2020-01-21 DIAGNOSIS — I10 Essential (primary) hypertension: Secondary | ICD-10-CM | POA: Insufficient documentation

## 2020-01-21 DIAGNOSIS — K219 Gastro-esophageal reflux disease without esophagitis: Secondary | ICD-10-CM | POA: Diagnosis not present

## 2020-01-21 DIAGNOSIS — M1990 Unspecified osteoarthritis, unspecified site: Secondary | ICD-10-CM | POA: Diagnosis not present

## 2020-01-21 DIAGNOSIS — H2512 Age-related nuclear cataract, left eye: Secondary | ICD-10-CM | POA: Insufficient documentation

## 2020-01-21 HISTORY — PX: CATARACT EXTRACTION W/PHACO: SHX586

## 2020-01-21 HISTORY — DX: Cardiac murmur, unspecified: R01.1

## 2020-01-21 SURGERY — PHACOEMULSIFICATION, CATARACT, WITH IOL INSERTION
Anesthesia: Monitor Anesthesia Care | Laterality: Left

## 2020-01-21 MED ORDER — MOXIFLOXACIN HCL 0.5 % OP SOLN
OPHTHALMIC | Status: DC | PRN
  Administered 2020-01-21: 0.2 mL via OPHTHALMIC

## 2020-01-21 MED ORDER — ARMC OPHTHALMIC DILATING DROPS
1.0000 "application " | OPHTHALMIC | Status: DC | PRN
  Administered 2020-01-21 (×3): 1 via OPHTHALMIC

## 2020-01-21 MED ORDER — TETRACAINE HCL 0.5 % OP SOLN
1.0000 [drp] | OPHTHALMIC | Status: DC | PRN
  Administered 2020-01-21 (×3): 1 [drp] via OPHTHALMIC

## 2020-01-21 MED ORDER — SODIUM HYALURONATE 23 MG/ML IO SOLN
INTRAOCULAR | Status: DC | PRN
  Administered 2020-01-21: 0.6 mL via INTRAOCULAR

## 2020-01-21 MED ORDER — FENTANYL CITRATE (PF) 100 MCG/2ML IJ SOLN
INTRAMUSCULAR | Status: DC | PRN
  Administered 2020-01-21 (×2): 25 ug via INTRAVENOUS
  Administered 2020-01-21: 50 ug via INTRAVENOUS

## 2020-01-21 MED ORDER — ACETAMINOPHEN 10 MG/ML IV SOLN: 1000.0000 mg | Freq: Once | INTRAVENOUS | Status: DC | PRN

## 2020-01-21 MED ORDER — ONDANSETRON HCL 4 MG/2ML IJ SOLN: 4.0000 mg | Freq: Once | INTRAMUSCULAR | Status: DC | PRN

## 2020-01-21 MED ORDER — LACTATED RINGERS IV SOLN: INTRAVENOUS | Status: DC

## 2020-01-21 MED ORDER — EPINEPHRINE PF 1 MG/ML IJ SOLN
INTRAOCULAR | Status: DC | PRN
  Administered 2020-01-21: 86 mL via OPHTHALMIC

## 2020-01-21 MED ORDER — LIDOCAINE HCL (PF) 2 % IJ SOLN
INTRAOCULAR | Status: DC | PRN
  Administered 2020-01-21: 1 mL via INTRAOCULAR

## 2020-01-21 MED ORDER — SODIUM HYALURONATE 10 MG/ML IO SOLN
INTRAOCULAR | Status: DC | PRN
  Administered 2020-01-21: 0.55 mL via INTRAOCULAR

## 2020-01-21 SURGICAL SUPPLY — 20 items
CANNULA ANT/CHMB 27G (MISCELLANEOUS) ×2 IMPLANT
CANNULA ANT/CHMB 27GA (MISCELLANEOUS) ×6 IMPLANT
DISSECTOR HYDRO NUCLEUS 50X22 (MISCELLANEOUS) ×3 IMPLANT
GLOVE SURG LX 7.5 STRW (GLOVE) ×2
GLOVE SURG LX STRL 7.5 STRW (GLOVE) ×1 IMPLANT
GLOVE SURG SYN 8.5  E (GLOVE) ×2
GLOVE SURG SYN 8.5 E (GLOVE) ×1 IMPLANT
GLOVE SURG SYN 8.5 PF PI (GLOVE) ×1 IMPLANT
GOWN STRL REUS W/ TWL LRG LVL3 (GOWN DISPOSABLE) ×2 IMPLANT
GOWN STRL REUS W/TWL LRG LVL3 (GOWN DISPOSABLE) ×6
LENS IOL DIOP 19.0 (Intraocular Lens) ×3 IMPLANT
LENS IOL TECNIS MONO 19.0 (Intraocular Lens) IMPLANT
MARKER SKIN DUAL TIP RULER LAB (MISCELLANEOUS) ×3 IMPLANT
PACK DR. KING ARMS (PACKS) ×3 IMPLANT
PACK EYE AFTER SURG (MISCELLANEOUS) ×3 IMPLANT
PACK OPTHALMIC (MISCELLANEOUS) ×3 IMPLANT
SYR 3ML LL SCALE MARK (SYRINGE) ×3 IMPLANT
SYR TB 1ML LUER SLIP (SYRINGE) ×3 IMPLANT
WATER STERILE IRR 250ML POUR (IV SOLUTION) ×3 IMPLANT
WIPE NON LINTING 3.25X3.25 (MISCELLANEOUS) ×3 IMPLANT

## 2020-01-21 NOTE — Anesthesia Preprocedure Evaluation (Signed)
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    GI/Hepatic neg GERD  ,  Endo/Other    Renal/GU      Musculoskeletal   Abdominal   Peds  Hematology   Anesthesia Other Findings   Reproductive/Obstetrics                             Anesthesia Physical Anesthesia Plan  ASA: III  Anesthesia Plan: MAC   Post-op Pain Management:    Induction: Intravenous  PONV Risk Score and Plan: 2 and TIVA, Midazolam and Treatment may vary due to age or medical condition  Airway Management Planned: Nasal Cannula  Additional Equipment:   Intra-op Plan:   Post-operative Plan:   Informed Consent: I have reviewed the patients History and Physical, chart, labs and discussed the procedure including the risks, benefits and alternatives for the proposed anesthesia with the patient or authorized representative who has indicated his/her understanding and acceptance.       Plan Discussed with: CRNA and Anesthesiologist  Anesthesia Plan Comments:         Anesthesia Quick Evaluation

## 2020-01-21 NOTE — Anesthesia Postprocedure Evaluation (Signed)
Anesthesia Post Note  Patient: Judy [name]  Procedure(s) Performed: CATARACT EXTRACTION PHACO AND INTRAOCULAR LENS PLACEMENT (IOC) LEFT (Left )     Patient location during evaluation: PACU Anesthesia Type: MAC Level of consciousness: awake and alert Pain management: pain level controlled Vital Signs Assessment: post-procedure vital signs reviewed and stable Respiratory status: spontaneous breathing, nonlabored ventilation, respiratory function stable and patient connected to nasal cannula oxygen Cardiovascular status: stable and blood pressure returned to baseline Postop Assessment: no apparent nausea or vomiting Anesthetic complications: no   No complications documented.  Loy Mccartt A  Traniyah Hallett

## 2020-01-21 NOTE — H&P (Signed)

## 2020-01-21 NOTE — Anesthesia Procedure Notes (Signed)
Procedure Name: MAC Date/Time: 01/21/2020 11:43 AM Performed by: Jeannene Patella, CRNA Pre-anesthesia Checklist: Patient identified, Emergency Drugs available, Suction available, Timeout performed and Patient being monitored Patient Re-evaluated:Patient Re-evaluated prior to induction Oxygen Delivery Method: Nasal cannula Placement Confirmation: positive ETCO2

## 2020-01-21 NOTE — Transfer of Care (Signed)
Immediate Anesthesia Transfer of Care Note  Patient: Judy [name]  Procedure(s) Performed: CATARACT EXTRACTION PHACO AND INTRAOCULAR LENS PLACEMENT (IOC) LEFT (Left )  Patient Location: PACU  Anesthesia Type: MAC  Level of Consciousness: awake, alert  and patient cooperative  Airway and Oxygen Therapy: Patient Spontanous Breathing and Patient connected to supplemental oxygen  Post-op Assessment: Post-op Vital signs reviewed, Patient's Cardiovascular Status Stable, Respiratory Function Stable, Patent Airway and No signs of Nausea or vomiting  Post-op Vital Signs: Reviewed and stable  Complications: No complications documented.

## 2020-01-21 NOTE — Op Note (Signed)
OPERATIVE NOTE  Judy [name] [id number] 01/21/2020   PREOPERATIVE DIAGNOSIS:  Nuclear sclerotic cataract left eye.  H25.12   POSTOPERATIVE DIAGNOSIS:    Nuclear sclerotic cataract left eye.     PROCEDURE:  Phacoemusification with posterior chamber intraocular lens placement of the left eye   LENS:   Implant Name Type Inv. Item Serial No. Manufacturer Lot No. LRB No. Used Action  LENS IOL DIOP 19.0 - [id number] Intraocular Lens LENS IOL DIOP 19.0 [id number] [employer]  Left 1 Implanted      Procedure(s): CATARACT EXTRACTION PHACO AND INTRAOCULAR LENS PLACEMENT (IOC) LEFT (Left)  DCB00 +19.0   ULTRASOUND TIME: 0 minutes 29 seconds.  CDE 4.09   SURGEON:  Willey Blade, MD, MPH   ANESTHESIA:  Topical with tetracaine drops augmented with 1% preservative-free intracameral lidocaine.  ESTIMATED BLOOD LOSS: <1 mL   COMPLICATIONS:  None.   DESCRIPTION OF PROCEDURE:  The patient was identified in the holding room and transported to the operating room and placed in the supine position under the operating microscope.  The left eye was identified as the operative eye and it was prepped and draped in the usual sterile ophthalmic fashion.   A 1.0 millimeter clear-corneal paracentesis was made at the 5:00 position. 0.5 ml of preservative-free 1% lidocaine with epinephrine was injected into the anterior chamber.  The anterior chamber was filled with Healon 5 viscoelastic.  A 2.4 millimeter keratome was used to make a near-clear corneal incision at the 2:00 position.  A curvilinear capsulorrhexis was made with a cystotome and capsulorrhexis forceps.  Balanced salt solution was used to hydrodissect and hydrodelineate the nucleus.   Phacoemulsification was then used in stop and chop fashion to remove the lens nucleus and epinucleus.  The remaining cortex was then removed using the irrigation and aspiration handpiece. Healon was then placed into the capsular bag to distend it for  lens placement.  A lens was then injected into the capsular bag.  The remaining viscoelastic was aspirated.   Wounds were hydrated with balanced salt solution.  The anterior chamber was inflated to a physiologic pressure with balanced salt solution.  Intracameral vigamox 0.1 mL undiltued was injected into the eye and a drop placed onto the ocular surface.  No wound leaks were noted.  The patient was taken to the recovery room in stable condition without complications of anesthesia or surgery  Willey Blade 01/21/2020, 12:00 PM

## 2020-01-22 ENCOUNTER — Encounter: Payer: Self-pay | Admitting: Ophthalmology

## 2020-02-28 ENCOUNTER — Encounter: Payer: Self-pay | Admitting: Ophthalmology

## 2020-03-06 ENCOUNTER — Other Ambulatory Visit
Admission: RE | Admit: 2020-03-06 | Discharge: 2020-03-06 | Disposition: A | Discharge status: Home / Self Care | Payer: Medicare Other | Source: Ambulatory Visit | Attending: Ophthalmology | Admitting: Ophthalmology

## 2020-03-06 ENCOUNTER — Other Ambulatory Visit: Payer: Self-pay

## 2020-03-06 DIAGNOSIS — Z01812 Encounter for preprocedural laboratory examination: Secondary | ICD-10-CM | POA: Insufficient documentation

## 2020-03-06 DIAGNOSIS — Z20822 Contact with and (suspected) exposure to covid-19: Secondary | ICD-10-CM | POA: Insufficient documentation

## 2020-03-06 LAB — SARS CORONAVIRUS 2 (TAT 6-24 HRS): SARS Coronavirus 2: NEGATIVE

## 2020-03-06 NOTE — Discharge Instructions (Signed)

## 2020-03-09 NOTE — Anesthesia Preprocedure Evaluation (Addendum)
Anesthesia Evaluation  Patient identified by MRN, date of birth, ID band Patient awake    Reviewed: Allergy & Precautions, NPO status , Patient's Chart, lab work & pertinent test results  History of Anesthesia Complications Negative for: history of anesthetic complications  Airway Mallampati: III   Neck ROM: Full    Dental  (+) Upper Dentures, Lower Dentures   Pulmonary former smoker (quit 1960s),    Pulmonary exam normal breath sounds clear to auscultation       Cardiovascular hypertension, + Valvular Problems/Murmurs (murmur)  Rhythm:Regular Rate:Normal + Systolic murmurs    Neuro/Psych Hx traumatic SDH 09/2019    GI/Hepatic GERD  ,  Endo/Other  negative endocrine ROS  Renal/GU Renal disease (stage III CKD)     Musculoskeletal  (+) Arthritis ,   Abdominal   Peds  Hematology negative hematology ROS (+)   Anesthesia Other Findings   Reproductive/Obstetrics                            Anesthesia Physical Anesthesia Plan  ASA: III  Anesthesia Plan: MAC   Post-op Pain Management:    Induction: Intravenous  PONV Risk Score and Plan: 2 and TIVA, Midazolam and Treatment may vary due to age or medical condition  Airway Management Planned: Nasal Cannula  Additional Equipment:   Intra-op Plan:   Post-operative Plan:   Informed Consent: I have reviewed the patients History and Physical, chart, labs and discussed the procedure including the risks, benefits and alternatives for the proposed anesthesia with the patient or authorized representative who has indicated his/her understanding and acceptance.       Plan Discussed with: CRNA  Anesthesia Plan Comments:        Anesthesia Quick Evaluation

## 2020-03-10 ENCOUNTER — Encounter
Admission: RE | Disposition: A | Discharge status: Home / Self Care | Payer: Self-pay | Source: Home / Self Care | Attending: Ophthalmology

## 2020-03-10 ENCOUNTER — Ambulatory Visit: Payer: Medicare Other | Admitting: Anesthesiology

## 2020-03-10 ENCOUNTER — Encounter: Payer: Self-pay | Admitting: Ophthalmology

## 2020-03-10 ENCOUNTER — Other Ambulatory Visit: Payer: Self-pay

## 2020-03-10 ENCOUNTER — Ambulatory Visit
Admission: RE | Admit: 2020-03-10 | Discharge: 2020-03-10 | Disposition: A | Discharge status: Home / Self Care | Payer: Medicare Other | Attending: Ophthalmology | Admitting: Ophthalmology

## 2020-03-10 DIAGNOSIS — I129 Hypertensive chronic kidney disease with stage 1 through stage 4 chronic kidney disease, or unspecified chronic kidney disease: Secondary | ICD-10-CM | POA: Diagnosis not present

## 2020-03-10 DIAGNOSIS — Z791 Long term (current) use of non-steroidal anti-inflammatories (NSAID): Secondary | ICD-10-CM | POA: Insufficient documentation

## 2020-03-10 DIAGNOSIS — K219 Gastro-esophageal reflux disease without esophagitis: Secondary | ICD-10-CM | POA: Insufficient documentation

## 2020-03-10 DIAGNOSIS — N183 Chronic kidney disease, stage 3 unspecified: Secondary | ICD-10-CM | POA: Diagnosis not present

## 2020-03-10 DIAGNOSIS — M1990 Unspecified osteoarthritis, unspecified site: Secondary | ICD-10-CM | POA: Diagnosis not present

## 2020-03-10 DIAGNOSIS — Z79899 Other long term (current) drug therapy: Secondary | ICD-10-CM | POA: Insufficient documentation

## 2020-03-10 DIAGNOSIS — Z87891 Personal history of nicotine dependence: Secondary | ICD-10-CM | POA: Diagnosis not present

## 2020-03-10 DIAGNOSIS — Z87820 Personal history of traumatic brain injury: Secondary | ICD-10-CM | POA: Diagnosis not present

## 2020-03-10 DIAGNOSIS — Z96641 Presence of right artificial hip joint: Secondary | ICD-10-CM | POA: Insufficient documentation

## 2020-03-10 DIAGNOSIS — H2511 Age-related nuclear cataract, right eye: Secondary | ICD-10-CM | POA: Insufficient documentation

## 2020-03-10 HISTORY — PX: CATARACT EXTRACTION W/PHACO: SHX586

## 2020-03-10 SURGERY — PHACOEMULSIFICATION, CATARACT, WITH IOL INSERTION
Anesthesia: Monitor Anesthesia Care | Site: Eye | Laterality: Right

## 2020-03-10 MED ORDER — TETRACAINE HCL 0.5 % OP SOLN
1.0000 [drp] | OPHTHALMIC | Status: DC | PRN
  Administered 2020-03-10 (×3): 1 [drp] via OPHTHALMIC

## 2020-03-10 MED ORDER — SODIUM HYALURONATE 10 MG/ML IO SOLN
INTRAOCULAR | Status: DC | PRN
  Administered 2020-03-10: 0.55 mL via INTRAOCULAR

## 2020-03-10 MED ORDER — BRIMONIDINE TARTRATE-TIMOLOL 0.2-0.5 % OP SOLN
OPHTHALMIC | Status: DC | PRN
  Administered 2020-03-10: 1 [drp] via OPHTHALMIC

## 2020-03-10 MED ORDER — ONDANSETRON HCL 4 MG/2ML IJ SOLN: 4.0000 mg | Freq: Once | INTRAMUSCULAR | Status: DC | PRN

## 2020-03-10 MED ORDER — ARMC OPHTHALMIC DILATING DROPS
1.0000 "application " | OPHTHALMIC | Status: DC | PRN
  Administered 2020-03-10 (×3): 1 via OPHTHALMIC

## 2020-03-10 MED ORDER — LIDOCAINE HCL (PF) 2 % IJ SOLN
INTRAOCULAR | Status: DC | PRN
  Administered 2020-03-10: 1 mL via INTRAOCULAR

## 2020-03-10 MED ORDER — HYDRALAZINE HCL 20 MG/ML IJ SOLN
10.0000 mg | Freq: Once | INTRAMUSCULAR | Status: AC
  Administered 2020-03-10: 10 mg via INTRAVENOUS

## 2020-03-10 MED ORDER — SODIUM HYALURONATE 23 MG/ML IO SOLN
INTRAOCULAR | Status: DC | PRN
  Administered 2020-03-10: 0.6 mL via INTRAOCULAR

## 2020-03-10 MED ORDER — NEOMYCIN-POLYMYXIN-DEXAMETH 3.5-10000-0.1 OP OINT
TOPICAL_OINTMENT | OPHTHALMIC | Status: DC | PRN
  Administered 2020-03-10: 1 via OPHTHALMIC

## 2020-03-10 MED ORDER — ACETAMINOPHEN 325 MG PO TABS: 650.0000 mg | ORAL_TABLET | Freq: Once | ORAL | Status: DC | PRN

## 2020-03-10 MED ORDER — MOXIFLOXACIN HCL 0.5 % OP SOLN
OPHTHALMIC | Status: DC | PRN
  Administered 2020-03-10: 0.2 mL via OPHTHALMIC

## 2020-03-10 MED ORDER — ACETAMINOPHEN 160 MG/5ML PO SOLN: 325.0000 mg | ORAL | Status: DC | PRN

## 2020-03-10 MED ORDER — EPINEPHRINE PF 1 MG/ML IJ SOLN
INTRAOCULAR | Status: DC | PRN
  Administered 2020-03-10: 94 mL via OPHTHALMIC

## 2020-03-10 MED ORDER — FENTANYL CITRATE (PF) 100 MCG/2ML IJ SOLN
INTRAMUSCULAR | Status: DC | PRN
Start: 2020-03-10 — End: 2020-03-10
  Administered 2020-03-10 (×2): 25 ug via INTRAVENOUS

## 2020-03-10 SURGICAL SUPPLY — 20 items
CANNULA ANT/CHMB 27G (MISCELLANEOUS) ×2 IMPLANT
CANNULA ANT/CHMB 27GA (MISCELLANEOUS) ×4 IMPLANT
DISSECTOR HYDRO NUCLEUS 50X22 (MISCELLANEOUS) ×2 IMPLANT
GLOVE SURG LX 7.5 STRW (GLOVE) ×1
GLOVE SURG LX STRL 7.5 STRW (GLOVE) ×1 IMPLANT
GLOVE SURG SYN 8.5  E (GLOVE) ×1
GLOVE SURG SYN 8.5 E (GLOVE) ×1 IMPLANT
GLOVE SURG SYN 8.5 PF PI (GLOVE) ×1 IMPLANT
GOWN STRL REUS W/ TWL LRG LVL3 (GOWN DISPOSABLE) ×2 IMPLANT
GOWN STRL REUS W/TWL LRG LVL3 (GOWN DISPOSABLE) ×4
LENS IOL DIOP 19.0 (Intraocular Lens) ×2 IMPLANT
LENS IOL TECNIS MONO 19.0 (Intraocular Lens) IMPLANT
MARKER SKIN DUAL TIP RULER LAB (MISCELLANEOUS) ×2 IMPLANT
PACK DR. KING ARMS (PACKS) ×2 IMPLANT
PACK EYE AFTER SURG (MISCELLANEOUS) ×2 IMPLANT
PACK OPTHALMIC (MISCELLANEOUS) ×2 IMPLANT
SYR 3ML LL SCALE MARK (SYRINGE) ×2 IMPLANT
SYR TB 1ML LUER SLIP (SYRINGE) ×2 IMPLANT
WATER STERILE IRR 250ML POUR (IV SOLUTION) ×2 IMPLANT
WIPE NON LINTING 3.25X3.25 (MISCELLANEOUS) ×2 IMPLANT

## 2020-03-10 NOTE — H&P (Signed)

## 2020-03-10 NOTE — Anesthesia Postprocedure Evaluation (Signed)
Anesthesia Post Note  Patient: Judy [name]  Procedure(s) Performed: CATARACT EXTRACTION PHACO AND INTRAOCULAR LENS PLACEMENT (IOC) RIGHT 4.49  00:44.5 (Right Eye)     Patient location during evaluation: PACU Anesthesia Type: MAC Level of consciousness: awake and alert, oriented and patient cooperative Pain management: pain level controlled Vital Signs Assessment: post-procedure vital signs reviewed and stable Respiratory status: spontaneous breathing, nonlabored ventilation and respiratory function stable Cardiovascular status: blood pressure returned to baseline and stable Postop Assessment: adequate PO intake Anesthetic complications: no   No complications documented.  Darrin Nipper

## 2020-03-10 NOTE — Anesthesia Procedure Notes (Signed)
Procedure Name: MAC Date/Time: 03/10/2020 8:51 AM Performed by: Cameron Ali, CRNA Pre-anesthesia Checklist: Patient identified, Emergency Drugs available, Suction available, Timeout performed and Patient being monitored Patient Re-evaluated:Patient Re-evaluated prior to induction Oxygen Delivery Method: Nasal cannula Placement Confirmation: positive ETCO2

## 2020-03-10 NOTE — Transfer of Care (Signed)
Immediate Anesthesia Transfer of Care Note  Patient: Judy [name]  Procedure(s) Performed: CATARACT EXTRACTION PHACO AND INTRAOCULAR LENS PLACEMENT (IOC) RIGHT 4.49  00:44.5 (Right Eye)  Patient Location: PACU  Anesthesia Type: MAC  Level of Consciousness: awake, alert  and patient cooperative  Airway and Oxygen Therapy: Patient Spontanous Breathing and Patient connected to supplemental oxygen  Post-op Assessment: Post-op Vital signs reviewed, Patient's Cardiovascular Status Stable, Respiratory Function Stable, Patent Airway and No signs of Nausea or vomiting  Post-op Vital Signs: Reviewed and stable  Complications: No complications documented.

## 2020-03-10 NOTE — Op Note (Signed)
OPERATIVE NOTE  Judy [name] [id number] 03/10/2020   PREOPERATIVE DIAGNOSIS:  Nuclear sclerotic cataract right eye.  H25.11   POSTOPERATIVE DIAGNOSIS:    Nuclear sclerotic cataract right eye.     PROCEDURE:  Phacoemusification with posterior chamber intraocular lens placement of the right eye   LENS:   Implant Name Type Inv. Item Serial No. Manufacturer Lot No. LRB No. Used Action  LENS IOL DIOP 19.0 - [id number] Intraocular Lens LENS IOL DIOP 19.0 [id number]   Right 1 Implanted       Procedure(s): CATARACT EXTRACTION PHACO AND INTRAOCULAR LENS PLACEMENT (IOC) RIGHT 4.49  00:44.5 (Right)  DCB00 +19.0   ULTRASOUND TIME: 0 minutes 44 seconds.  CDE 4.49   SURGEON:  Willey Blade, MD, MPH  ANESTHESIOLOGIST: Anesthesiologist: Reed Breech, MD CRNA: Maree Krabbe, CRNA   ANESTHESIA:  Topical with tetracaine drops augmented with 1% preservative-free intracameral lidocaine.  ESTIMATED BLOOD LOSS: less than 1 mL.   COMPLICATIONS:  None.   DESCRIPTION OF PROCEDURE:  The patient was identified in the holding room and transported to the operating room and placed in the supine position under the operating microscope.  The right eye was identified as the operative eye and it was prepped and draped in the usual sterile ophthalmic fashion.   A 1.0 millimeter clear-corneal paracentesis was made at the 10:30 position. 0.5 ml of preservative-free 1% lidocaine with epinephrine was injected into the anterior chamber.  The anterior chamber was filled with Healon 5 viscoelastic.  A 2.4 millimeter keratome was used to make a near-clear corneal incision at the 8:00 position.  A curvilinear capsulorrhexis was made with a cystotome and capsulorrhexis forceps.  Balanced salt solution was used to hydrodissect and hydrodelineate the nucleus.  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   Scissors were used to incise the conjunctiva away  from the temporal wound near the limbus about 2 mm.  This caused some patient discomfort.   Phacoemulsification was then used in stop and chop fashion to remove the lens nucleus and epinucleus.  The remaining cortex was then removed using the irrigation and aspiration handpiece. Healon was then placed into the capsular bag to distend it for lens placement.  A lens was then injected into the capsular bag.  The remaining viscoelastic was aspirated.   Wounds were hydrated with balanced salt solution.  The anterior chamber was inflated to a physiologic pressure with balanced salt solution.   Intracameral vigamox 0.1 mL undiluted was injected into the eye and a drop placed onto the ocular surface.  A drop of combigan was placed, maxitrol ointment and a patch and shield for patient comfort were placed.   No wound leaks were noted.  The patient was taken to the recovery room in stable condition without complications of anesthesia or surgery  Willey Blade 03/10/2020, 9:29 AM

## 2020-03-11 ENCOUNTER — Encounter: Payer: Self-pay | Admitting: Ophthalmology

## 2021-07-09 ENCOUNTER — Ambulatory Visit: Payer: Medicare Other | Admitting: Occupational Therapy

## 2021-07-13 ENCOUNTER — Encounter: Payer: Self-pay | Admitting: Occupational Therapy

## 2021-07-13 ENCOUNTER — Ambulatory Visit: Payer: Medicare Other | Attending: Orthopedic Surgery | Admitting: Occupational Therapy

## 2021-07-13 ENCOUNTER — Other Ambulatory Visit: Payer: Self-pay

## 2021-07-13 DIAGNOSIS — G5603 Carpal tunnel syndrome, bilateral upper limbs: Secondary | ICD-10-CM | POA: Diagnosis not present

## 2021-07-13 DIAGNOSIS — M6281 Muscle weakness (generalized): Secondary | ICD-10-CM | POA: Insufficient documentation

## 2021-07-13 NOTE — Therapy (Signed)
[facility] PHYSICAL AND SPORTS MEDICINE [address] [place], [place], [address] Phone: [phone]   Fax:  [phone]  Occupational Therapy Evaluation  Patient Details  Name: Judy [name] Pumalizabeth H Carrithers MRN: [id number] Date of Birth: December 10, 1925 Referring Provider (OT): Dr Ernest PineHooten   Encounter Date: 07/13/2021   OT End of Session - 07/13/21 1505     Visit Number 1    Number of Visits 3    Date for OT Re-Evaluation 08/24/21    OT Start Time 1345    OT Stop Time 1447    OT Time Calculation (min) 62 min    Activity Tolerance Patient tolerated treatment well    Behavior During Therapy [facility] for tasks assessed/performed             Past Medical History:  Diagnosis Date   Heart murmur    High cholesterol    Hypertension    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   [facility], discharged from ED    Past Surgical History:  Procedure Laterality Date   CATARACT EXTRACTION W/PHACO Left 01/21/2020   Procedure: CATARACT EXTRACTION PHACO AND INTRAOCULAR LENS PLACEMENT (IOC) LEFT;  Surgeon: Nevada CraneKing, Bradley Mark, MD;  [facility];  Service: Ophthalmology;  Laterality: Left;   CATARACT EXTRACTION W/PHACO Right 03/10/2020   Procedure: CATARACT EXTRACTION PHACO AND INTRAOCULAR LENS PLACEMENT (IOC) RIGHT 4.49  00:44.5;  Surgeon: Nevada CraneKing, Bradley Mark, MD;  [facility];  Service: Ophthalmology;  Laterality: Right;   TOTAL HIP ARTHROPLASTY Right 2009    There were no vitals filed for this visit.   Subjective Assessment - 07/13/21 1456     Subjective  I have hard time with my hands the Right worse than the Left to cut food , eat, do my buttons, zips, socks .My hands goes numb when gripping or holding my book to read- not really pain - mostly numbness and dropping things and opening things    Pertinent History Seen Orthopedics  06/30/21 - The patient is a 88 y.o. female who returns today with a friend for  reevaluation of her right hip. She is 13 years status post right total hip arthroplasty. She denies any significant groin or thigh pain. She is using a walker for ambulation. She has had several falls over the last year but denies any injury to the right hip.    The patient did request evaluation of her hands. She reports some gradually progressive deformities and pain to the fingers/hands that are causing some functional issues. She has difficulty using eating utensils or gripping/grasping items. She has not appreciated any significant improvement with the use of NSAIDs.  - Refer to OT    Patient Stated Goals I am alone at home now and want to be able to live alone and independent    Currently in Pain? 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    Referring Provider (OT) Dr Ernest PineHooten    Onset Date/Surgical Date 06/30/21    Hand Dominance Right      Home  Environment   Lives With Alone      Prior Function   Vocation Retired    Leisure read, Pharmacologistlaundry and [employer] cooking, shopping with friend      Sensation   Additional Comments report diminish sensation in  thumb thru 4th R hand worse than L      AROM   Overall AROM Comments arthritic change to DIP's , wrist AROM WFL- athropy at thenar eminence      Strength   Right Hand Grip (lbs) 8    Right Hand Lateral Pinch 2 lbs    Right Hand 3 Point Pinch --   unable   Left Hand Grip (lbs) 15    Left Hand Lateral Pinch 6 lbs    Left Hand 3 Point Pinch 5 lbs      Right Hand AROM   R Thumb Palmar ABduction/ADduction 0-45 unable    R Thumb Opposition to Index --   thumb ADD - cannot oppose- athropy thenar     Left Hand AROM   L Thumb Radial ADduction/ABduction 0-55 WFL    L Thumb Palmar ADduction/ABduction 0-45 WFL    L Thumb Opposition to Index --   to tip of 5th             Pt and friend was educated on AE and use of them Verbalize and  demonstration Button hook with zip puller - [employer] Long handle sponge or brush  Spring loaded scissors Build up grips provided for pt to use  on utencils - was able to cut food and eat independent Sock aid - demo - pt has one from when she had her THR-- review with pt how to use it -  Pt to hold or carry things with palms or fore arms And use wrist splint when reading or gripping tasks that cause numbness  Pt fitted with small wrist splint to decrease numbness - wear when reading book , gripping or carrying objects  Follow up in 3 wks -                OT Education - 07/13/21 1505     Education Details Finding of eval and HEP    Person(s) Educated Patient    Methods Explanation;Demonstration;Tactile cues;Verbal cues;Handout    Comprehension Returned demonstration;Verbalized understanding;Verbal cues required                 OT Long Term Goals - 07/13/21 1519       OT LONG TERM GOAL #1   Title Pt and friend demo and verbalize 5 adaptive equipment use in ADL's that have pt be more independent at home    Baseline cannot hold spoon, cut with knife, do buttons or zips, put on sockes-  bath feet or back - open cheese or cold meats- drop things    Time 6    Period Weeks    Status New    Target Date 08/24/21      OT LONG TERM GOAL #2   Title Pt to report decrease numbnes episodes with holding book or grip object tight    Baseline numbness in R hand more than L -and happens every day when gripping , carrying or holding book    Time 3    Period Weeks    Status New    Target Date 08/03/21                   Plan - 07/13/21 1509     Clinical Impression Statement Pt present at OT evaluations with diagnosis of OA- but symptoms is mostly hands going numb when gripping or holding objects and dropping things.Harder time using hands in her ADL's and IADL's -husband past away Nov 22 and living alone now.  Pt wrist AROM WFL, and fisting WFL but arthritic changes in  DIP's. Pt do show atrophy at bilateral thumb thenars- and unable to do Thumb PA and opposition in R hand. Positive Tinel in R hand at CT- and positive Phalens on bilateral CT- R worse than the L. Pt  not interested in referal for nerve conduction and surgery. Pt and friend was educated in [place] [employer] to be able to be more independent in eating , bathing ,dressing - as well as splinting for R CT and modifications to decrease CT symptoms. Pt and friend demo and verbalize understanding of what AE can help her, modifications and use of splint - pt will return in about 3 wks for follow up using AE and modifications - reassess strength and bring list of any other difficulties she has in ADL's and IADL's performance at home    OT Occupational Profile and History Problem Focused Assessment - Including review of records relating to presenting problem    Occupational performance deficits (Please refer to evaluation for details): ADL's;IADL's;Play;Leisure    Body Structure / Function / Physical Skills ADL;Decreased knowledge of use of DME;Strength;UE functional use;IADL;Coordination;Flexibility    Rehab Potential Fair    Clinical Decision Making Limited treatment options, no task modification necessary    Comorbidities Affecting Occupational Performance: May have comorbidities impacting occupational performance   OA and CTS   Modification or Assistance to Complete Evaluation  No modification of tasks or assist necessary to complete eval    OT Frequency Biweekly    OT Duration 6 weeks    OT Treatment/Interventions Self-care/ADL training;DME and/or AE instruction;Splinting;Coping strategies training;Therapeutic exercise;Patient/family education;Manual Therapy    Consulted and Agree with Plan of Care Patient             Patient will benefit from skilled therapeutic intervention in order to improve the following deficits and impairments:   Body Structure / Function / Physical Skills: ADL, Decreased  knowledge of use of DME, Strength, UE functional use, IADL, Coordination, Flexibility       Visit Diagnosis: Carpal tunnel syndrome, bilateral upper limbs - Plan: Ot plan of care cert/re-cert  Muscle weakness (generalized) - Plan: Ot plan of care cert/re-cert    Problem List There are no problems to display for this patient.   Oletta Cohn, OTR/L,CLT 07/13/2021, 3:24 PM  [facility] PHYSICAL AND SPORTS MEDICINE [address] [place], [address] Phone: [phone]   Fax:  [phone]  Name: JARIAH TARKOWSKI MRN: [id number] Date of Birth: [birth year]

## 2021-08-03 ENCOUNTER — Ambulatory Visit: Payer: Medicare Other | Attending: Orthopedic Surgery | Admitting: Occupational Therapy

## 2021-08-03 ENCOUNTER — Other Ambulatory Visit: Payer: Self-pay

## 2021-08-03 DIAGNOSIS — G5603 Carpal tunnel syndrome, bilateral upper limbs: Secondary | ICD-10-CM | POA: Insufficient documentation

## 2021-08-03 DIAGNOSIS — M6281 Muscle weakness (generalized): Secondary | ICD-10-CM | POA: Diagnosis present

## 2021-08-03 NOTE — Therapy (Signed)
[place] [facility] PHYS[facility] SPORTS MEDICINE [address] [place], [address] Phone: [phone]   Fax:  [phone]  Occupational Therapy Treatment  Patient Details  Name: ZHARIA CONROW MRN: [id number] Date of Birth: [birth year] Referring Provider (OT): Dr Ernest Pine   Encounter Date: 08/03/2021   OT End of Session - 08/03/21 1342     Visit Number 2    Number of Visits 3    Date for OT Re-Evaluation 08/24/21    OT Start Time 1342    OT Stop Time 1439    OT Time Calculation (min) 57 min    Activity Tolerance Patient tolerated treatment well    Behavior During Therapy [facility] for tasks assessed/performed             Past Medical History:  Diagnosis Date   Heart murmur    High cholesterol    Hypertension    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   transferred from [facility] to [facility] on 10/12/19, discharged from ED    Past Surgical History:  Procedure Laterality Date   CATARACT EXTRACTION W/PHACO Left 01/21/2020   Procedure: CATARACT EXTRACTION PHACO AND INTRAOCULAR LENS PLACEMENT (IOC) LEFT;  Surgeon: Nevada Crane, MD;  Location: [facility];  Service: Ophthalmology;  Laterality: Left;   CATARACT EXTRACTION W/PHACO Right 03/10/2020   Procedure: CATARACT EXTRACTION PHACO AND INTRAOCULAR LENS PLACEMENT (IOC) RIGHT 4.49  00:44.5;  Surgeon: Nevada Crane, MD;  Location: [facility];  Service: Ophthalmology;  Laterality: Right;   TOTAL HIP ARTHROPLASTY Right 2009    There were no vitals filed for this visit.   Subjective Assessment - 08/03/21 1342     Subjective  I am so tired - we were early in town to go to the bank and my financial guy - but it's holiday and everything is close -so I am out and about now since 9am - my L hand is starting to go numb    Pertinent History Seen Orthopedics  06/30/21 - The patient is a 88 y.o. female who returns today with a friend for reevaluation of her right hip. She is 13 years  status post right total hip arthroplasty. She denies any significant groin or thigh pain. She is using a walker for ambulation. She has had several falls over the last year but denies any injury to the right hip.    The patient did request evaluation of her hands. She reports some gradually progressive deformities and pain to the fingers/hands that are causing some functional issues. She has difficulty using eating utensils or gripping/grasping items. She has not appreciated any significant improvement with the use of NSAIDs.  - Refer to OT    Patient Stated Goals I am alone at home now and want to be able to live alone and independent    Currently in Pain? No/denies                  Pt and friend report pt tired - was out and about in town the whole day - in and out of truck Grip decrease and prehension bilateral hands this date - ? Increase CTS or tired   Pt and friend was educated on AE and use of them last time Verbalize and demonstrate understanding then but had to review again some of into  Button hook with zip puller - [employer] Long handle sponge or brush  Spring loaded scissors Build up grips provided for pt to use  on utencils -  was able to cut food and eat independent- neighbor did put on on pen -but need to put on her utensils Practice again and for writing use   Sock aid - demo - pt has one from when she had her THR-- review with pt how to use it -  And can use reacher to take off socks or dressing stick - info provided   Pt to hold or carry things with palms or fore arms Fitted with bilateral CMC neoprene splints to use during activities - to keep thumb CMC out of palm and CT - to decrease symptoms  report L one starting to go numb and dropping things  Use splints when gripping objects like reading book , gripping or carrying objects  Hard time putting clothes on pen - put on table and use palm to push pen down to open   Follow up in 3 wks -                 OT Education - 08/03/21 1653     Education Details changes to HEP, splint wearing and AE trng    Person(s) Educated Patient    Methods Explanation;Demonstration;Tactile cues;Verbal cues;Handout    Comprehension Returned demonstration;Verbalized understanding;Verbal cues required                 OT Long Term Goals - 07/13/21 1519       OT LONG TERM GOAL #1   Title Pt and friend demo and verbalize 5 adaptive equipment use in ADL's that have pt be more independent at home    Baseline cannot hold spoon, cut with knife, do buttons or zips, put on sockes-  bath feet or back - open cheese or cold meats- drop things    Time 6    Period Weeks    Status New    Target Date 08/24/21      OT LONG TERM GOAL #2   Title Pt to report decrease numbnes episodes with holding book or grip object tight    Baseline numbness in R hand more than L -and happens every day when gripping , carrying or holding book    Time 3    Period Weeks    Status New    Target Date 08/03/21                   Plan - 08/03/21 1343     Clinical Impression Statement Pt present at OT evaluations with diagnosis of OA- but symptoms is mostly hands going numb when gripping or holding objects and dropping things.Harder time using hands in her ADL's and IADL's -husband past away Nov 22 and living alone now. Pt wrist AROM WFL, and fisting WFL but arthritic changes in DIP's. Pt do show atrophy at bilateral thumb thenars- and unable to do Thumb PA and opposition in R hand. Positive Tinel in R hand at CT- and positive Phalens on bilateral CT- R worse than the L. Pt  not interested in referal for nerve conduction and surgery. She reports L hand going numb more and dropping objects. Review again with pt and friend info on  Adaptive Equipment to be able to be more independent in eating , bathing ,dressing - as well as splinting - fitted this date with bilateral CMC neoprene splints that supports thumb CMC out of the CT and palm  during gripping . as well as ed on modifications of tasks to decrease CT symptoms. Pt and friend demo and verbalize understanding  of what AE can help her, modifications and use of splints - pt will return in about 3 wks for follow up using AE and modifications - she has neighbour that can order some things on [employer] for her-  reassess strength this date - decrease but could be because she was tired - WIll reassess in 3 wks and pt to bring list of any other difficulties she has in ADL's and IADL's performance at home    OT Occupational Profile and History Problem Focused Assessment - Including review of records relating to presenting problem    Occupational performance deficits (Please refer to evaluation for details): ADL's;IADL's;Play;Leisure    Body Structure / Function / Physical Skills ADL;Decreased knowledge of use of DME;Strength;UE functional use;IADL;Coordination;Flexibility    Rehab Potential Fair    Clinical Decision Making Limited treatment options, no task modification necessary    Comorbidities Affecting Occupational Performance: May have comorbidities impacting occupational performance    Modification or Assistance to Complete Evaluation  No modification of tasks or assist necessary to complete eval    OT Frequency Biweekly    OT Duration 4 weeks    OT Treatment/Interventions Self-care/ADL training;DME and/or AE instruction;Splinting;Coping strategies training;Therapeutic exercise;Patient/family education;Manual Therapy    Consulted and Agree with Plan of Care Patient             Patient will benefit from skilled therapeutic intervention in order to improve the following deficits and impairments:   Body Structure / Function / Physical Skills: ADL, Decreased knowledge of use of DME, Strength, UE functional use, IADL, Coordination, Flexibility       Visit Diagnosis: Carpal tunnel syndrome, bilateral upper limbs  Muscle weakness (generalized)    Problem List There are no  problems to display for this patient.   Oletta Cohn, OTR/L,CLT 08/03/2021, 4:58 PM  [place] [facility] PHYSICAL AND SPORTS MEDICINE [address] [place], [address] Phone: [phone]   Fax:  [phone]  Name: DAISEY RAMAKRISHNAN MRN: [id number] Date of Birth: 09/29/1925

## 2021-08-24 ENCOUNTER — Ambulatory Visit: Payer: Medicare Other | Attending: Orthopedic Surgery | Admitting: Occupational Therapy

## 2021-08-24 ENCOUNTER — Other Ambulatory Visit: Payer: Self-pay

## 2021-08-24 DIAGNOSIS — M6281 Muscle weakness (generalized): Secondary | ICD-10-CM | POA: Diagnosis present

## 2021-08-24 DIAGNOSIS — G5603 Carpal tunnel syndrome, bilateral upper limbs: Secondary | ICD-10-CM | POA: Diagnosis present

## 2021-08-24 NOTE — Therapy (Signed)
[place] [facility]TS MEDICINE [address] [place], [place], [address] Phone: [phone]   Fax:  [phone]  Occupational Therapy Treatment  Patient Details  Name: Judy [name] MRN: [id number] Date of Birth: 03/21/1926 Referring Provider (OT): Dr Ernest PineHooten   Encounter Date: 08/24/2021   OT End of Session - 08/24/21 1921     Visit Number 3    Number of Visits 3    Date for OT Re-Evaluation 08/24/21    OT Start Time 1033    OT Stop Time 1114    OT Time Calculation (min) 41 min    Activity Tolerance Patient tolerated treatment well    Behavior During Therapy [facility] for tasks assessed/performed             Past Medical History:  Diagnosis Date   Heart murmur    High cholesterol    Hypertension    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   [facility] on [facility], discharged from ED    Past Surgical History:  Procedure Laterality Date   CATARACT EXTRACTION W/PHACO Left 01/21/2020   Procedure: CATARACT EXTRACTION PHACO AND INTRAOCULAR LENS PLACEMENT (IOC) LEFT;  Surgeon: Nevada CraneKing, Bradley Mark, MD;  [facility];  Service: Ophthalmology;  Laterality: Left;   CATARACT EXTRACTION W/PHACO Right 03/10/2020   Procedure: CATARACT EXTRACTION PHACO AND INTRAOCULAR LENS PLACEMENT (IOC) RIGHT 4.49  00:44.5;  Surgeon: Nevada CraneKing, Bradley Mark, MD;  [facility];  Service: Ophthalmology;  Laterality: Right;   TOTAL HIP ARTHROPLASTY Right 2009    There were no vitals filed for this visit.   Subjective Assessment - 08/24/21 1920     Subjective  I continues to have persistent numbness in my right hand, but left hand is better not dropping objects.  My neighbor did order me some things of [employer].  Utensils, scissors, zipper puller and buttonhook,    Pertinent History Seen Orthopedics  06/30/21 - The patient is a 88 y.o. female who returns today with a friend for reevaluation of her right hip. She is 13  years status post right total hip arthroplasty. She denies any significant groin or thigh pain. She is using a walker for ambulation. She has had several falls over the last year but denies any injury to the right hip.    The patient did request evaluation of her hands. She reports some gradually progressive deformities and pain to the fingers/hands that are causing some functional issues. She has difficulty using eating utensils or gripping/grasping items. She has not appreciated any significant improvement with the use of NSAIDs.  - Refer to OT    Patient Stated Goals I am alone at home now and want to be able to live alone and independent    Currently in Pain? No/denies                [facility] - 08/24/21 0001       Strength   Right Hand Grip (lbs) 9    Right Hand Lateral Pinch 3 lbs    Right Hand 3 Point Pinch --   unable   Left Hand Grip (lbs) 20    Left Hand Lateral Pinch 6 lbs    Left Hand 3 Point Pinch 8 lbs            increase grip and prehension on L hand -     Patient report right hand about the same, continues to have numbness and dropping objects.  Left hand numbness not as often and dropping objects less She reports a neighbor of hers did order her some adaptive equipment off [employer] hook with zip puller  Long handle sponge or brush  Spring loaded scissors Build up grips provided for pt to use  on utencils - was able to cut food and eat independent- neighbor did put on on pen -but need to put on her utensils Built-up grip for light switch Reports she continues to have trouble with pulling up her bra strap or pushing down adjusting it or putting a sweater on, reviewed with patient and friend again dressing stick. Provided information again on dressing stick can get it at [employer] - demo - pt has one from when she had her THR-- review with pt how to use it -  And can use reacher to take off socks or dressing stick - info provided    Pt  to hold or carry things with palms or fore arms Fitted  before with bilateral CMC neoprene splints to use during activities - to keep thumb CMC out of palm and CT - to decrease symptoms  report L one starting to go numb and dropping things  Use splints when gripping objects like reading book , gripping or carrying objects  Hard time putting clothes on pen - put on table and use palm to push pen down to open    Follow up if needed in 3 month                          OT Education - 08/24/21 1921     Education Details changes to HEP, splint wearing and AE trng    Person(s) Educated Patient    Methods Explanation;Demonstration;Tactile cues;Verbal cues;Handout    Comprehension Returned demonstration;Verbalized understanding;Verbal cues required                 OT Long Term Goals - 08/24/21 1926       OT LONG TERM GOAL #1   Title Pt and friend demo and verbalize 5 adaptive equipment use in ADL's that have pt be more independent at home    Baseline cannot hold spoon, cut with knife, do buttons or zips, put on sockes-  bath feet or back - open cheese or cold meats- drop thing - her neighbour got her some AE and pt demo in clinic she can use them    Status Achieved      OT LONG TERM GOAL #2   Title Pt to report decrease numbnes episodes with holding book or grip object tight    Baseline numbness in R hand more than L -and happens every day when gripping , carrying or holding book -Less numbness in L hand but R hand about same-    Time 3    Period Months    Status On-going    Target Date 11/16/21      OT LONG TERM GOAL #3   Title Pt cont with homeprogram to maintain her AROM and grip/prehension strength in bilateral hands to maintain her independence living alone    Baseline Made progress in grip and prehension - less dropping objects in L hand -and got some AE to increase independence    Time 3    Period Months    Status New    Target Date 11/16/21  Plan - 08/24/21 1922     Clinical Impression Statement Pt present at OT evaluations with diagnosis of OA- but symptoms is mostly hands going numb when gripping or holding objects and dropping things.Harder time using hands in her ADL's and IADL's -husband past away Nov 22 and living alone now. Pt wrist AROM WFL, and fisting WFL but arthritic changes in DIP's. Pt do show atrophy at bilateral thumb thenars- and unable to do Thumb PA and opposition in R hand. Positive Tinel in R hand at CT- and positive Phalens on bilateral CT- R worse than the L. Pt  not interested in referal for nerve conduction and surgery. She reports less numbness and dropping objects in L hand.  Grip and prehension strength increase in the L hand and R hand she was able to maintain. Pt report she do pay attention how she hold and grip objects, and Neighbour did order her some AE  to be able to be more independent in eating , bathing ,dressing - as well as splinting -  SHe is wearing bilateral CMC neoprene splints that supports thumb CMC out of the CT and palm during gripping . as well as ed on modifications of tasks to decrease CT symptoms. Pt and friend demo and verbalize understanding of what AE can help her, modifications and use of splints - Pt to cont at home and return if needed in 3 months - WIll reassess if she is able to maintain her ROM, strenght and independence using her hands in ADL's and IADL's performance at home    OT Occupational Profile and History Problem Focused Assessment - Including review of records relating to presenting problem    Occupational performance deficits (Please refer to evaluation for details): ADL's;IADL's;Play;Leisure    Body Structure / Function / Physical Skills ADL;Decreased knowledge of use of DME;Strength;UE functional use;IADL;Coordination;Flexibility    Rehab Potential Fair    Clinical Decision Making Limited treatment options, no task modification necessary    Comorbidities  Affecting Occupational Performance: May have comorbidities impacting occupational performance    Modification or Assistance to Complete Evaluation  No modification of tasks or assist necessary to complete eval    OT Frequency Monthly    OT Duration 4 weeks    OT Treatment/Interventions Self-care/ADL training;DME and/or AE instruction;Splinting;Coping strategies training;Therapeutic exercise;Patient/family education;Manual Therapy    Consulted and Agree with Plan of Care Patient             Patient will benefit from skilled therapeutic intervention in order to improve the following deficits and impairments:   Body Structure / Function / Physical Skills: ADL, Decreased knowledge of use of DME, Strength, UE functional use, IADL, Coordination, Flexibility       Visit Diagnosis: Carpal tunnel syndrome, bilateral upper limbs - Plan: Ot plan of care cert/re-cert  Muscle weakness (generalized) - Plan: Ot plan of care cert/re-cert    Problem List There are no problems to display for this patient.   Oletta Cohn, OTR/L,CLT 08/24/2021, 7:31 PM  [place] [facility] PHYSICAL AND SPORTS MEDICINE [address], [place], [address] Phone: [phone]   Fax:  [phone]  Name: Judy [name] MRN: [id number] Date of Birth: [birth year]

## 2021-10-14 ENCOUNTER — Ambulatory Visit (INDEPENDENT_AMBULATORY_CARE_PROVIDER_SITE_OTHER): Payer: Medicare Other | Admitting: Podiatry

## 2021-10-14 ENCOUNTER — Encounter: Payer: Self-pay | Admitting: Podiatry

## 2021-10-14 DIAGNOSIS — B351 Tinea unguium: Secondary | ICD-10-CM | POA: Diagnosis not present

## 2021-10-14 DIAGNOSIS — B029 Zoster without complications: Secondary | ICD-10-CM | POA: Insufficient documentation

## 2021-10-14 DIAGNOSIS — M79675 Pain in left toe(s): Secondary | ICD-10-CM | POA: Diagnosis not present

## 2021-10-14 DIAGNOSIS — M1990 Unspecified osteoarthritis, unspecified site: Secondary | ICD-10-CM | POA: Insufficient documentation

## 2021-10-14 DIAGNOSIS — M79674 Pain in right toe(s): Secondary | ICD-10-CM

## 2021-10-14 DIAGNOSIS — E785 Hyperlipidemia, unspecified: Secondary | ICD-10-CM | POA: Insufficient documentation

## 2021-10-14 DIAGNOSIS — R011 Cardiac murmur, unspecified: Secondary | ICD-10-CM | POA: Insufficient documentation

## 2021-10-19 NOTE — Progress Notes (Signed)
?  Subjective:  ?Patient ID: MONNA CREAN, female    DOB: [birth year],  MRN: [id number] ? ?Severely thickened nails and calluses ? ?88 y.o. female presents with the above complaint. History confirmed with patient.  Her toes curl under and she walks on the toenails and is very painful ? ?Objective:  ?Physical Exam: ?warm, good capillary refill, no trophic changes or ulcerative lesions, normal DP and PT pulses, normal sensory exam, and severe contracted hammertoe and pes planus deformity there is severe onychomycosis and dystrophy of all nails. ? ?Assessment:  ? ?1. Pain due to onychomycosis of toenails of both feet   ? ? ? ?Plan:  ?Patient was evaluated and treated and all questions answered. ? ?We discussed how her contractures contribute to her nail dystrophy and onychomycosis.  It do not think there is a reasonable way to straighten these out without surgical intervention.  Discussed shoe gear and offloading.  Nails were debrided in length and thickness using sharp nail nipper to tolerable level. ? ?Return in about 3 months (around 01/14/2022) for painful nails.  ? ?

## 2022-01-07 IMAGING — CT CT HEAD W/O CM
3 series · 15 of 46 positions shown, 18 images · non-contrast
Comparison: Films from earlier in the same day.

CLINICAL DATA: Follow-up subdural hematoma

EXAM:
CT HEAD WITHOUT CONTRAST
TECHNIQUE: Contiguous axial images were obtained from the base of the skull
through the vertex without intravenous contrast.

[Series 2: head wo · axial · 0.43mm/px · z∈[-84,+36]mm · 9 of 29 slices shown, 12 images]
[im 3/29  brain]
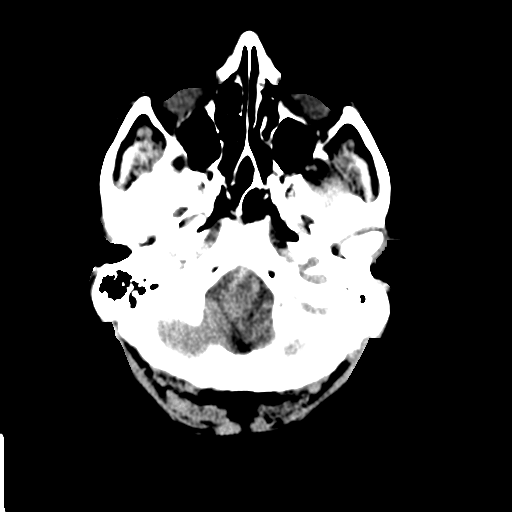
[im 3/29  bone]
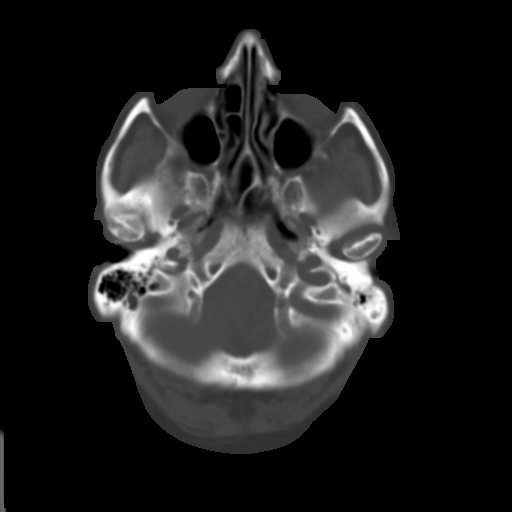
[im 6/29  brain]
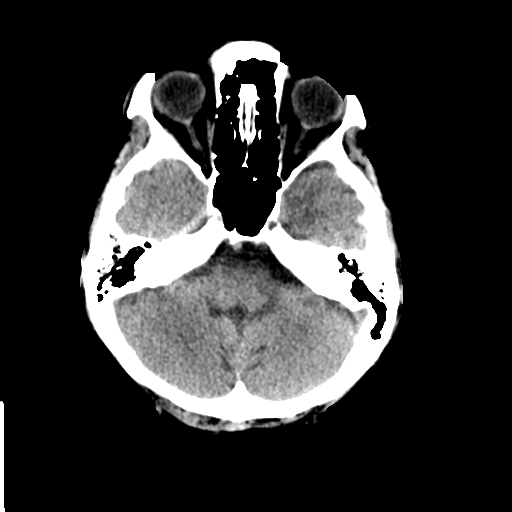
[im 9/29  brain]
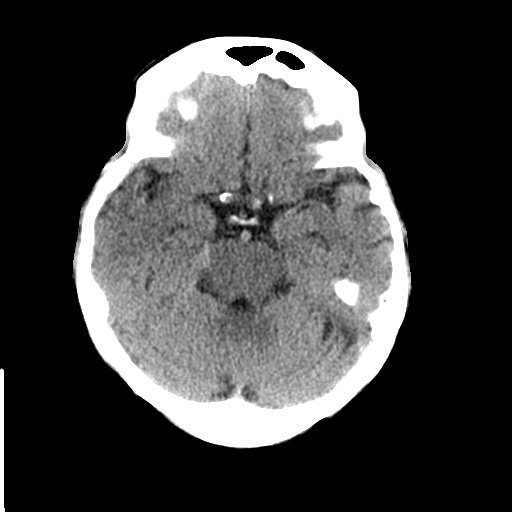
[im 12/29  brain]
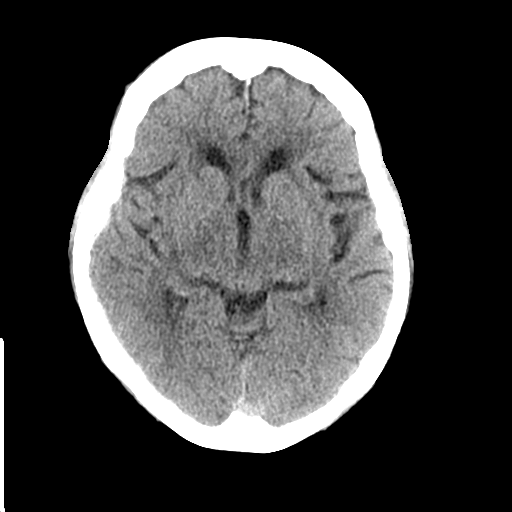
[im 15/29  brain]
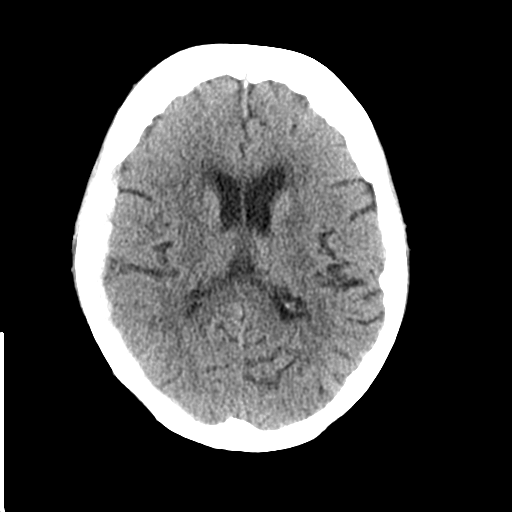
[im 15/29  bone]
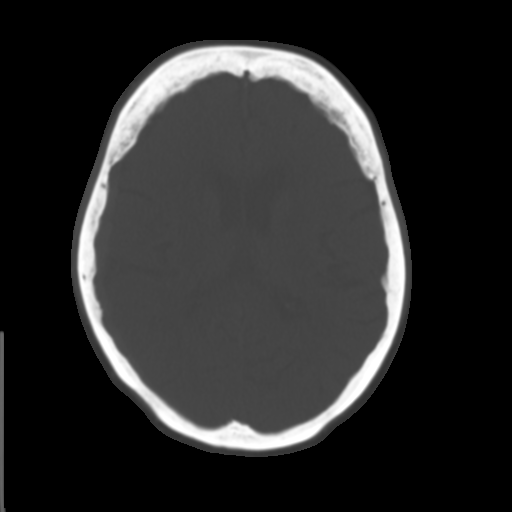
[im 18/29  brain]
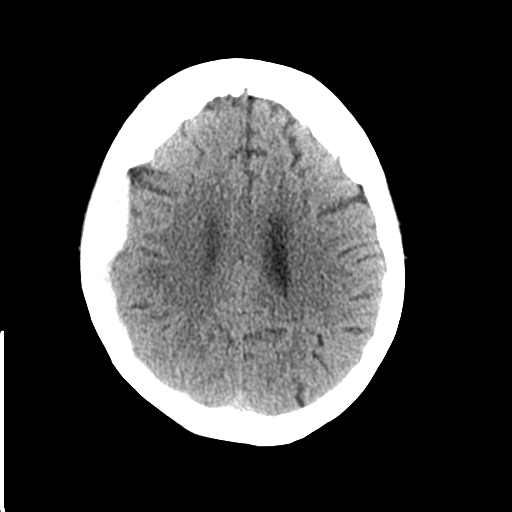
[im 21/29  brain]
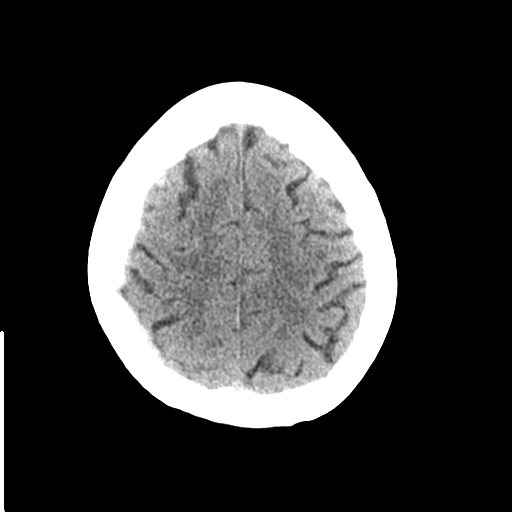
[im 24/29  brain]
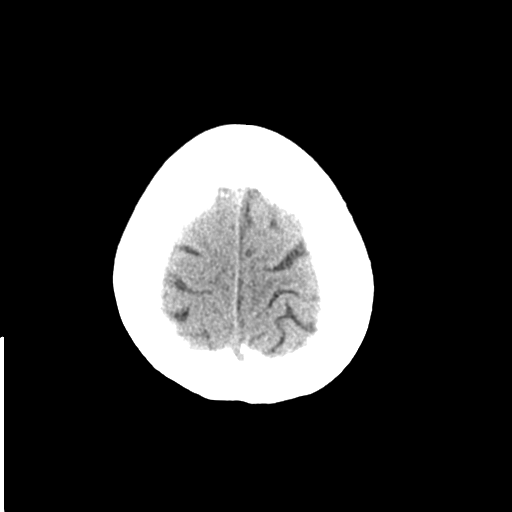
[im 27/29  brain]
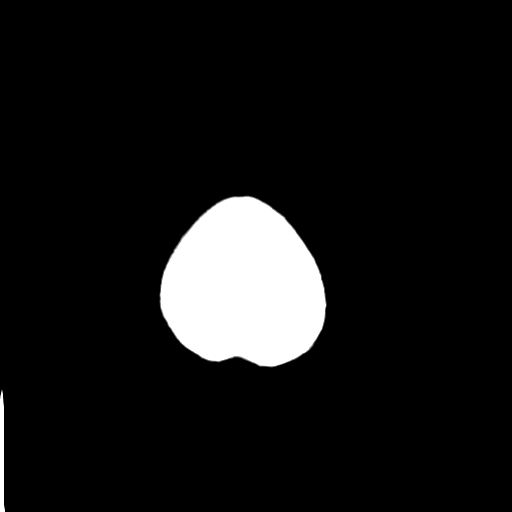
[im 27/29  bone]
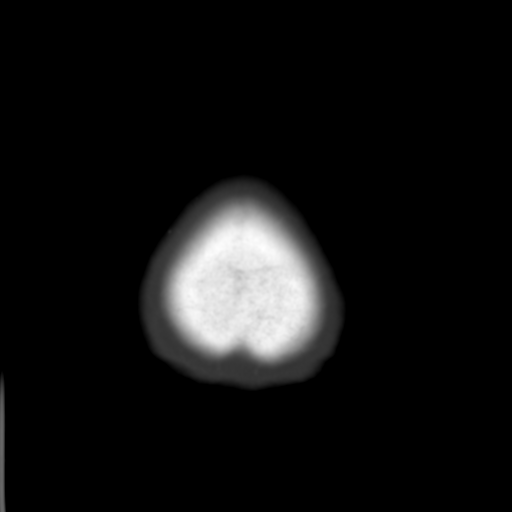

[Series 4: coronal soft tissue · coronal · 0.30mm/px · 3 of 60 slices shown]
[im 20/60  brain]
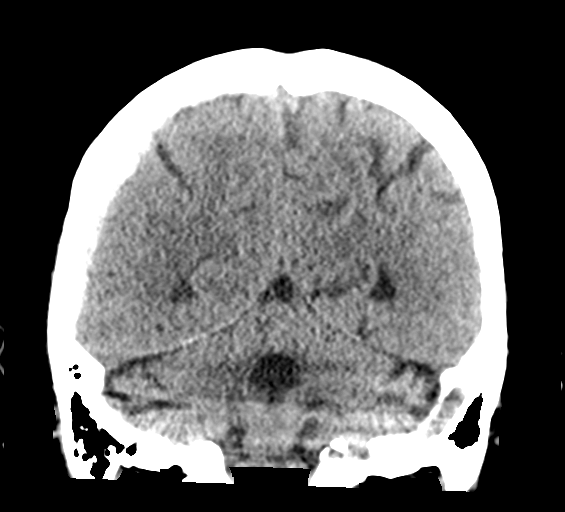
[im 27/60  brain]
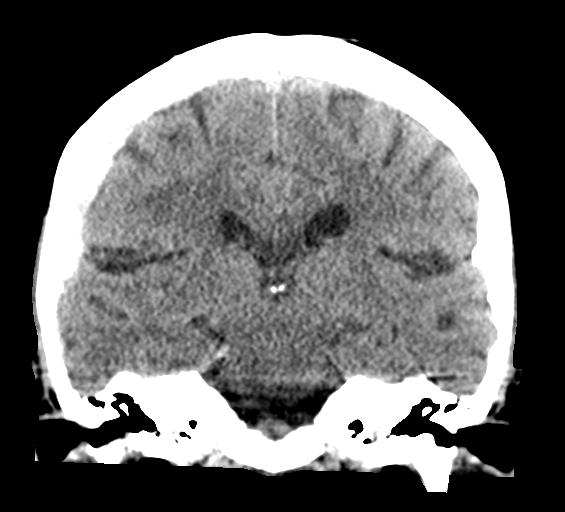
[im 33/60  brain]
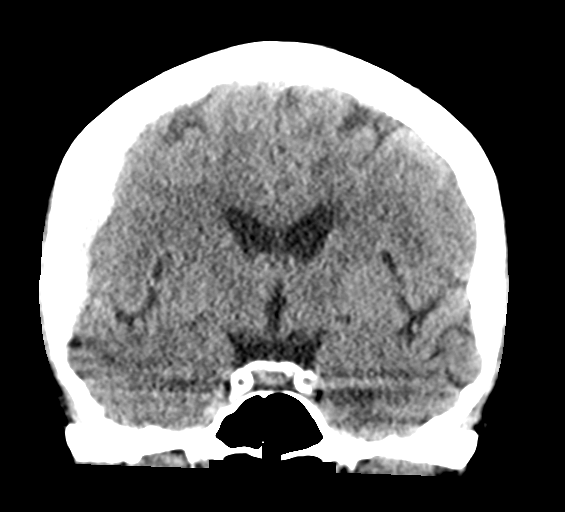

[Series 5: sagittal soft tissue · sagittal · 0.29mm/px · 3 of 52 slices shown]
[im 18/52  brain]
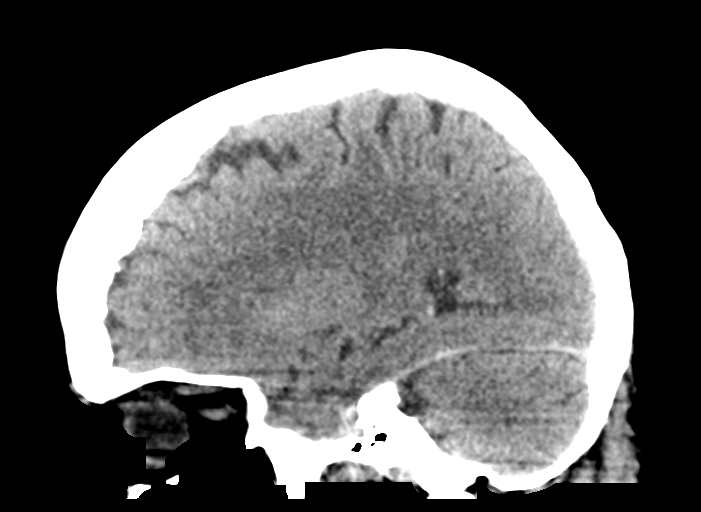
[im 26/52  brain]
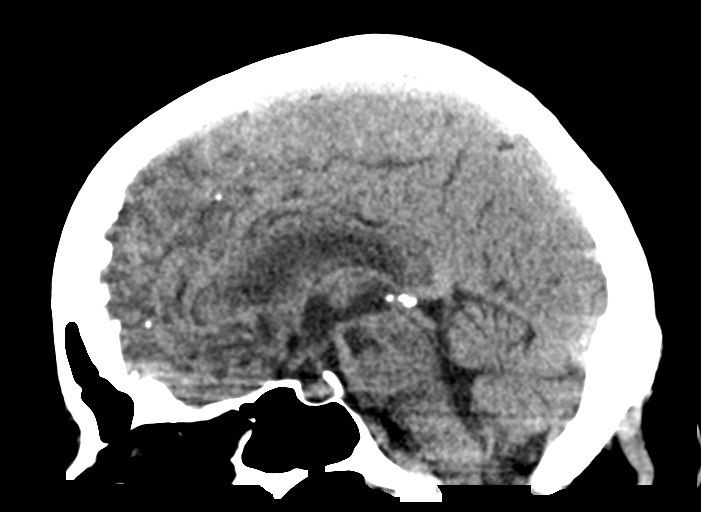
[im 35/52  brain]
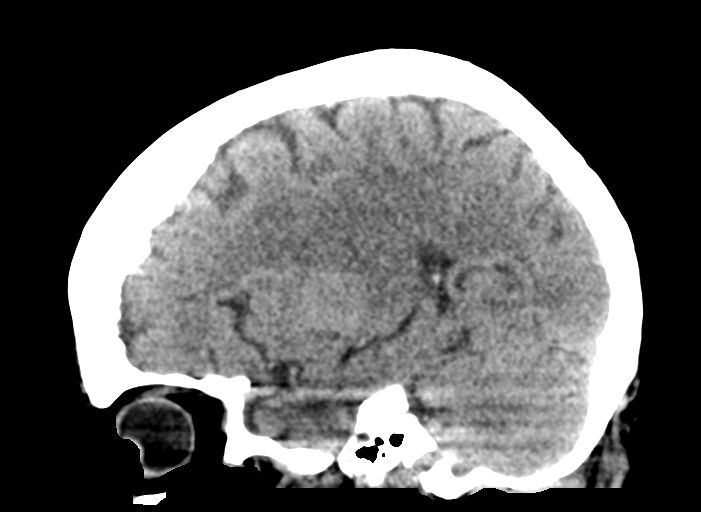

[15 of 46 positions shown; findings below may reference images not displayed]

FINDINGS: Brain: Right-sided subdural hematoma is again identified and has
increased in size somewhat in the interval from the prior exam now
measuring approximately 9 mm in thickness. The extent over the
cerebral convexity is increased as well when compared with prior
exam. No new parenchymal hemorrhage is seen. Mild mass-effect is
noted locally although no midline shift is seen.

Vascular: No hyperdense vessel or unexpected calcification.

Skull: Normal. Negative for fracture or focal lesion.

Sinuses/Orbits: No acute finding.

Other: None.
IMPRESSION: Increase in right-sided subdural hematoma as described.

## 2022-01-13 ENCOUNTER — Ambulatory Visit (INDEPENDENT_AMBULATORY_CARE_PROVIDER_SITE_OTHER): Payer: Medicare Other | Admitting: Podiatry

## 2022-01-13 DIAGNOSIS — B351 Tinea unguium: Secondary | ICD-10-CM | POA: Diagnosis not present

## 2022-01-13 DIAGNOSIS — M79674 Pain in right toe(s): Secondary | ICD-10-CM | POA: Diagnosis not present

## 2022-01-13 DIAGNOSIS — M79675 Pain in left toe(s): Secondary | ICD-10-CM | POA: Diagnosis not present

## 2022-01-13 NOTE — Progress Notes (Signed)
  Subjective:  Patient ID: Judy [name], female    DOB: 1926-02-23,  MRN: [id number]  Severely thickened nails and calluses  88 y.o. female presents with the above complaint. History confirmed with patient.  Nails are quite painful again  Objective:  Physical Exam: warm, good capillary refill, no trophic changes or ulcerative lesions, normal DP and PT pulses, normal sensory exam, and severe contracted hammertoe and pes planus deformity there is severe onychomycosis and dystrophy of all nails.  Yellow discoloration and probably some obstruction debris  Assessment:   1. Pain due to onychomycosis of toenails of both feet      Plan:  Patient was evaluated and treated and all questions answered.  Discussed the etiology and treatment options for the condition in detail with the patient. Educated patient on the topical and oral treatment options for mycotic nails. Recommended debridement of the nails today. Sharp and mechanical debridement performed of all painful and mycotic nails today. Nails debrided in length and thickness using a nail nipper to level of comfort. Discussed treatment options including appropriate shoe gear. Follow up as needed for painful nails.    Return in about 10 weeks (around 03/24/2022) for painful thickened nails.

## 2022-03-24 ENCOUNTER — Ambulatory Visit (INDEPENDENT_AMBULATORY_CARE_PROVIDER_SITE_OTHER): Payer: Medicare Other | Admitting: Podiatry

## 2022-03-24 DIAGNOSIS — B351 Tinea unguium: Secondary | ICD-10-CM

## 2022-03-24 DIAGNOSIS — M79675 Pain in left toe(s): Secondary | ICD-10-CM

## 2022-03-24 DIAGNOSIS — M79674 Pain in right toe(s): Secondary | ICD-10-CM | POA: Diagnosis not present

## 2022-03-24 NOTE — Patient Instructions (Signed)
More silicone pads can be purchased from:  [email or web address]  

## 2022-03-28 NOTE — Progress Notes (Signed)
  Subjective:  Patient ID: Judy [name], female    DOB: [birth year],  MRN: [id number]  Severely thickened nails and calluses  88 y.o. female presents with the above complaint. History confirmed with patient.  Nails are quite painful again  Objective:  Physical Exam: warm, good capillary refill, no trophic changes or ulcerative lesions, normal DP and PT pulses, normal sensory exam, and severe contracted hammertoe and pes planus deformity there is severe onychomycosis and dystrophy of all nails.  Yellow discoloration and probably some obstruction debris  Assessment:   1. Pain due to onychomycosis of toenails of both feet      Plan:  Patient was evaluated and treated and all questions answered.  Discussed the etiology and treatment options for the condition in detail with the patient. Educated patient on the topical and oral treatment options for mycotic nails. Recommended debridement of the nails today. Sharp and mechanical debridement performed of all painful and mycotic nails today. Nails debrided in length and thickness using a nail nipper to level of comfort. Discussed treatment options including appropriate shoe gear. Follow up as needed for painful nails.    Return in about 10 weeks (around 06/02/2022) for painful nails .

## 2022-06-02 ENCOUNTER — Ambulatory Visit (INDEPENDENT_AMBULATORY_CARE_PROVIDER_SITE_OTHER): Payer: Medicare Other | Admitting: Podiatry

## 2022-06-02 DIAGNOSIS — B351 Tinea unguium: Secondary | ICD-10-CM | POA: Diagnosis not present

## 2022-06-02 DIAGNOSIS — M79674 Pain in right toe(s): Secondary | ICD-10-CM

## 2022-06-02 DIAGNOSIS — M79675 Pain in left toe(s): Secondary | ICD-10-CM | POA: Diagnosis not present

## 2022-06-02 NOTE — Progress Notes (Signed)
  Subjective:  Patient ID: Judy [name], female    DOB: Oct 03, 1925,  MRN: [id number]  Severely thickened nails and calluses  88 y.o. female presents with the above complaint. History confirmed with patient.  Nails are quite painful again  Objective:  Physical Exam: warm, good capillary refill, no trophic changes or ulcerative lesions, normal DP and PT pulses, normal sensory exam, and severe contracted hammertoe and pes planus deformity there is severe onychomycosis and dystrophy of all nails.  Yellow discoloration and probably some obstruction debris  Assessment:   1. Pain due to onychomycosis of toenails of both feet      Plan:  Patient was evaluated and treated and all questions answered.  Discussed the etiology and treatment options for the condition in detail with the patient. Educated patient on the topical and oral treatment options for mycotic nails. Recommended debridement of the nails today. Sharp and mechanical debridement performed of all painful and mycotic nails today. Nails debrided in length and thickness using a nail nipper to level of comfort. Discussed treatment options including appropriate shoe gear. Follow up as needed for painful nails.    Return in about 10 weeks (around 08/11/2022) for painful nails .

## 2022-08-02 ENCOUNTER — Ambulatory Visit (INDEPENDENT_AMBULATORY_CARE_PROVIDER_SITE_OTHER): Payer: Medicare Other | Admitting: Nurse Practitioner

## 2022-08-02 VITALS — BP 170/78 | HR 88 | Ht 67.0 in | Wt 138.0 lb

## 2022-08-02 DIAGNOSIS — M1990 Unspecified osteoarthritis, unspecified site: Secondary | ICD-10-CM

## 2022-08-02 DIAGNOSIS — J301 Allergic rhinitis due to pollen: Secondary | ICD-10-CM

## 2022-08-02 DIAGNOSIS — E782 Mixed hyperlipidemia: Secondary | ICD-10-CM | POA: Diagnosis not present

## 2022-08-02 DIAGNOSIS — I10 Essential (primary) hypertension: Secondary | ICD-10-CM | POA: Diagnosis not present

## 2022-08-02 DIAGNOSIS — H9113 Presbycusis, bilateral: Secondary | ICD-10-CM

## 2022-08-02 DIAGNOSIS — R053 Chronic cough: Secondary | ICD-10-CM

## 2022-08-02 DIAGNOSIS — K219 Gastro-esophageal reflux disease without esophagitis: Secondary | ICD-10-CM

## 2022-08-02 DIAGNOSIS — R7301 Impaired fasting glucose: Secondary | ICD-10-CM

## 2022-08-02 MED ORDER — BENZONATATE 100 MG PO CAPS: 100.0000 mg | ORAL_CAPSULE | Freq: Three times a day (TID) | ORAL | 1 refills | Status: DC | PRN

## 2022-08-02 MED ORDER — LORATADINE 10 MG PO TABS: 10.0000 mg | ORAL_TABLET | Freq: Every day | ORAL | 1 refills | Status: DC

## 2022-08-02 MED ORDER — PANTOPRAZOLE SODIUM 40 MG PO TBEC: 40.0000 mg | DELAYED_RELEASE_TABLET | Freq: Two times a day (BID) | ORAL | 1 refills | Status: DC

## 2022-08-02 NOTE — Patient Instructions (Addendum)
1) Follow up appt in 6 months, fasting labs at that time

## 2022-08-02 NOTE — Progress Notes (Signed)
Established Patient Office Visit  Subjective:  Patient ID: Judy [name], female    DOB: 1926-05-06  Age: 89 y.o. MRN: [id number]  Chief Complaint  Patient presents with   Follow-up    Follow up and fasting for labs. Left knee pain, taking acetaminophen.  Left knee edema.  Pelvic OA.  Magnesium 250 mg nightly at bedtime.  Doesn't sleep well at night, not napping during the day.     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   transferred from [facility] to [facility] on 10/12/19, discharged from ED    Social History   Socioeconomic History   Marital status: Married    Spouse name: Not on file   Number of children: Not on file   Years of education: Not on file   Highest education level: Not on file  Occupational History   Not on file  Tobacco Use   Smoking status: Never   Smokeless tobacco: Never  Substance and Sexual Activity   Alcohol use: No   Drug use: Never   Sexual activity: Not on file  Other Topics Concern   Not on file  Social History Narrative   Not on file   Social Determinants of Health   Financial Resource Strain: Not on file  Food Insecurity: Not on file  Transportation Needs: Not on file  Physical Activity: Not on file  Stress: Not on file  Social Connections: Not on file  Intimate Partner Violence: Not on file    No family history on file.  No Known Allergies  Review of Systems  Constitutional:  Positive for malaise/fatigue.  HENT:  Positive for hearing loss.   Eyes: Negative.   Respiratory: Negative.    Cardiovascular: Negative.   Gastrointestinal:  Positive for heartburn.  Genitourinary:  Positive for frequency.  Musculoskeletal:  Positive for joint pain.  Skin: Negative.   Neurological: Negative.   Endo/Heme/Allergies: Negative.   Psychiatric/Behavioral:  The patient is nervous/anxious.        Objective:   BP (!) 170/78   Pulse 88    Ht 5' 7"$  (1.702 m)   Wt 138 lb (62.6 kg)   SpO2 96%   BMI 21.61 kg/m   Vitals:   08/02/22 0907  BP: (!) 170/78  Pulse: 88  Height: 5' 7"$  (1.702 m)  Weight: 138 lb (62.6 kg)  SpO2: 96%  BMI (Calculated): 21.61    Physical Exam Vitals reviewed.  Constitutional:      Appearance: Normal appearance.  HENT:     Head: Normocephalic.     Nose: Nose normal.     Mouth/Throat:     Mouth: Mucous membranes are dry.  Eyes:     Pupils: Pupils are equal, round, and reactive to light.  Cardiovascular:     Rate and Rhythm: Normal rate and regular rhythm.  Pulmonary:     Effort: Pulmonary effort is normal.     Breath sounds: Normal breath sounds.  Abdominal:     General: Bowel sounds are normal.     Palpations: Abdomen is soft.  Musculoskeletal:        General: Tenderness present.     Cervical back: Neck supple.  Skin:    General: Skin is warm and dry.  Neurological:     Mental Status: She is alert and oriented to person, place, and time.  Psychiatric:  Mood and Affect: Mood normal.        Behavior: Behavior normal.      No results found for any visits on 08/02/22.  No results found for this or any previous visit (from the past 2160 hour(s)).    Assessment & Plan:   Problem List Items Addressed This Visit       Cardiovascular and Mediastinum   Hypertension - Primary   Relevant Orders   TSH   CMP14+EGFR     Nervous and Auditory   Presbycusis of both ears     Musculoskeletal and Integument   Arthritis   Relevant Medications   diclofenac (VOLTAREN) 75 MG EC tablet     Other   Hyperlipidemia   Relevant Orders   Lipid panel   Other Visit Diagnoses     Impaired fasting blood sugar       Relevant Orders   Hemoglobin A1c   Chronic cough       Relevant Medications   benzonatate (TESSALON PERLES) 100 MG capsule   Seasonal allergic rhinitis due to pollen       Relevant Medications   loratadine (CLARITIN) 10 MG tablet   Gastroesophageal reflux disease  without esophagitis       Relevant Medications   pantoprazole (PROTONIX) 40 MG tablet       No follow-ups on file.   Total time spent: 25 minutes  Evern Bio, NP  08/02/2022

## 2022-08-03 LAB — CMP14+EGFR
ALT: 14 IU/L (ref 0–32)
AST: 19 IU/L (ref 0–40)
Albumin/Globulin Ratio: 2 (ref 1.2–2.2)
Albumin: 4.1 g/dL (ref 3.6–4.6)
Alkaline Phosphatase: 97 IU/L (ref 44–121)
BUN/Creatinine Ratio: 21 (ref 12–28)
BUN: 19 mg/dL (ref 10–36)
Bilirubin Total: 0.7 mg/dL (ref 0.0–1.2)
CO2: 22 mmol/L (ref 20–29)
Calcium: 9.3 mg/dL (ref 8.7–10.3)
Chloride: 107 mmol/L — ABNORMAL HIGH (ref 96–106)
Creatinine, Ser: 0.9 mg/dL (ref 0.57–1.00)
Globulin, Total: 2.1 g/dL (ref 1.5–4.5)
Glucose: 89 mg/dL (ref 70–99)
Potassium: 4.3 mmol/L (ref 3.5–5.2)
Sodium: 143 mmol/L (ref 134–144)
Total Protein: 6.2 g/dL (ref 6.0–8.5)
eGFR: 59 mL/min/{1.73_m2} — ABNORMAL LOW (ref >59)

## 2022-08-03 LAB — LIPID PANEL
Chol/HDL Ratio: 3 ratio (ref 0.0–4.4)
Cholesterol, Total: 190 mg/dL (ref 100–199)
HDL: 64 mg/dL (ref >39)
LDL Chol Calc (NIH): 107 mg/dL — ABNORMAL HIGH (ref 0–99)
Triglycerides: 107 mg/dL (ref 0–149)
VLDL Cholesterol Cal: 19 mg/dL (ref 5–40)

## 2022-08-03 LAB — HEMOGLOBIN A1C
Est. average glucose Bld gHb Est-mCnc: 114 mg/dL
Hgb A1c MFr Bld: 5.6 % (ref 4.8–5.6)

## 2022-08-03 LAB — TSH: TSH: 2.78 u[IU]/mL (ref 0.450–4.500)

## 2022-08-16 ENCOUNTER — Ambulatory Visit (INDEPENDENT_AMBULATORY_CARE_PROVIDER_SITE_OTHER): Payer: Medicare Other | Admitting: Podiatry

## 2022-08-16 ENCOUNTER — Encounter: Payer: Self-pay | Admitting: Podiatry

## 2022-08-16 VITALS — BP 168/86 | HR 80

## 2022-08-16 DIAGNOSIS — M79675 Pain in left toe(s): Secondary | ICD-10-CM

## 2022-08-16 DIAGNOSIS — M79674 Pain in right toe(s): Secondary | ICD-10-CM

## 2022-08-16 DIAGNOSIS — B351 Tinea unguium: Secondary | ICD-10-CM | POA: Diagnosis not present

## 2022-08-16 NOTE — Progress Notes (Signed)
  Subjective:  Patient ID: Judy [name], female    DOB: [birth year],  MRN: [id number]  Severely thickened nails and calluses  89 y.o. female presents with the above complaint. History confirmed with patient.  Nails are quite painful again.  Regular debridement has been helpful.  Reports no other new issues  Objective:  Physical Exam: warm, good capillary refill, no trophic changes or ulcerative lesions, normal DP and PT pulses, normal sensory exam, and severe contracted hammertoe and pes planus deformity there is severe onychomycosis and dystrophy of all nails.  Yellow discoloration and subungual debris  Assessment:   1. Pain due to onychomycosis of toenails of both feet      Plan:  Patient was evaluated and treated and all questions answered.  Discussed the etiology and treatment options for the condition in detail with the patient. Educated patient on the topical and oral treatment options for mycotic nails. Recommended debridement of the nails today. Sharp and mechanical debridement performed of all painful and mycotic nails today. Nails debrided in length and thickness using a nail nipper to level of comfort. Discussed treatment options including appropriate shoe gear. Follow up as needed for painful nails.    Return in about 10 weeks (around 10/25/2022) for painful thick fungal nails.

## 2022-09-20 ENCOUNTER — Other Ambulatory Visit: Payer: Self-pay | Admitting: Nurse Practitioner

## 2022-10-25 ENCOUNTER — Ambulatory Visit (INDEPENDENT_AMBULATORY_CARE_PROVIDER_SITE_OTHER): Payer: Medicare Other | Admitting: Podiatry

## 2022-10-25 ENCOUNTER — Encounter: Payer: Self-pay | Admitting: Podiatry

## 2022-10-25 DIAGNOSIS — M79675 Pain in left toe(s): Secondary | ICD-10-CM

## 2022-10-25 DIAGNOSIS — M79674 Pain in right toe(s): Secondary | ICD-10-CM

## 2022-10-25 DIAGNOSIS — B351 Tinea unguium: Secondary | ICD-10-CM

## 2022-10-25 NOTE — Progress Notes (Signed)
  Subjective:  Patient ID: Judy [name], female    DOB: [birth year],  MRN: [id number]  Severely thickened nails and calluses  89 y.o. female presents with the above complaint. History confirmed with patient.  Nails are quite painful again.  Regular debridement has been helpful.  Reports no other new issues  Objective:  Physical Exam: warm, good capillary refill, no trophic changes or ulcerative lesions, normal DP and PT pulses, normal sensory exam, and severe contracted hammertoe and pes planus deformity there is severe onychomycosis and dystrophy of all nails.  Yellow discoloration and subungual debris  Assessment:   1. Pain due to onychomycosis of toenails of both feet      Plan:  Patient was evaluated and treated and all questions answered.  Discussed the etiology and treatment options for the condition in detail with the patient. Educated patient on the topical and oral treatment options for mycotic nails. Recommended debridement of the nails today. Sharp and mechanical debridement performed of all painful and mycotic nails today. Nails debrided in length and thickness using a nail nipper to level of comfort. Discussed treatment options including appropriate shoe gear. Follow up as needed for painful nails.    Return in about 10 weeks (around 01/03/2023) for painful thick fungal nails.

## 2022-11-11 ENCOUNTER — Other Ambulatory Visit: Payer: Self-pay | Admitting: Nurse Practitioner

## 2022-11-11 DIAGNOSIS — J301 Allergic rhinitis due to pollen: Secondary | ICD-10-CM

## 2022-11-17 ENCOUNTER — Other Ambulatory Visit: Payer: Self-pay | Admitting: Nurse Practitioner

## 2022-11-17 DIAGNOSIS — K219 Gastro-esophageal reflux disease without esophagitis: Secondary | ICD-10-CM

## 2023-01-03 ENCOUNTER — Ambulatory Visit: Payer: Medicare Other | Admitting: Podiatry

## 2023-01-04 ENCOUNTER — Emergency Department
Admission: EM | Admit: 2023-01-04 | Discharge: 2023-01-04 | Disposition: A | Discharge status: Home / Self Care | Payer: Medicare Other | Attending: Emergency Medicine | Admitting: Emergency Medicine

## 2023-01-04 ENCOUNTER — Other Ambulatory Visit: Payer: Self-pay

## 2023-01-04 DIAGNOSIS — R110 Nausea: Secondary | ICD-10-CM | POA: Diagnosis not present

## 2023-01-04 DIAGNOSIS — D649 Anemia, unspecified: Secondary | ICD-10-CM | POA: Insufficient documentation

## 2023-01-04 DIAGNOSIS — I10 Essential (primary) hypertension: Secondary | ICD-10-CM | POA: Insufficient documentation

## 2023-01-04 LAB — COMPREHENSIVE METABOLIC PANEL
ALT: 12 U/L (ref 0–44)
AST: 18 U/L (ref 15–41)
Albumin: 2 g/dL — ABNORMAL LOW (ref 3.5–5.0)
Alkaline Phosphatase: 68 U/L (ref 38–126)
Anion gap: 5 (ref 5–15)
BUN: 25 mg/dL — ABNORMAL HIGH (ref 8–23)
CO2: 23 mmol/L (ref 22–32)
Calcium: 7.4 mg/dL — ABNORMAL LOW (ref 8.9–10.3)
Chloride: 108 mmol/L (ref 98–111)
Creatinine, Ser: 0.98 mg/dL (ref 0.44–1.00)
GFR, Estimated: 53 mL/min — ABNORMAL LOW (ref >60)
Glucose, Bld: 122 mg/dL — ABNORMAL HIGH (ref 70–99)
Potassium: 4.1 mmol/L (ref 3.5–5.1)
Sodium: 136 mmol/L (ref 135–145)
Total Bilirubin: 0.4 mg/dL (ref 0.3–1.2)
Total Protein: 4.4 g/dL — ABNORMAL LOW (ref 6.5–8.1)

## 2023-01-04 LAB — CBC
HCT: 30.9 % — ABNORMAL LOW (ref 36.0–46.0)
Hemoglobin: 9.9 g/dL — ABNORMAL LOW (ref 12.0–15.0)
MCH: 29.7 pg (ref 26.0–34.0)
MCHC: 32 g/dL (ref 30.0–36.0)
MCV: 92.8 fL (ref 80.0–100.0)
Platelets: 674 10*3/uL — ABNORMAL HIGH (ref 150–400)
RBC: 3.33 MIL/uL — ABNORMAL LOW (ref 3.87–5.11)
RDW: 15.5 % (ref 11.5–15.5)
WBC: 12.3 10*3/uL — ABNORMAL HIGH (ref 4.0–10.5)
nRBC: 0 % (ref 0.0–0.2)

## 2023-01-04 LAB — LIPASE, BLOOD: Lipase: 44 U/L (ref 11–51)

## 2023-01-04 MED ORDER — ONDANSETRON 4 MG PO TBDP: 4.0000 mg | ORAL_TABLET | Freq: Three times a day (TID) | ORAL | 0 refills | Status: DC | PRN

## 2023-01-04 MED ORDER — PANTOPRAZOLE SODIUM 40 MG PO TBEC: 40.0000 mg | DELAYED_RELEASE_TABLET | Freq: Every day | ORAL | 1 refills | Status: DC

## 2023-01-04 NOTE — Discharge Instructions (Addendum)
As we discussed please take your Protonix each morning for the next 2 months.  You may take your nausea medication if needed but only as prescribed.  Please drink plenty of fluids and follow-up with your doctor for recheck/reevaluation.  If your symptoms worsen please return to the emergency department for further workup.

## 2023-01-04 NOTE — ED Triage Notes (Signed)
Pt to ED for intermittent emesis and diarrhea for the past week. States not sure if dehydrated.

## 2023-01-04 NOTE — ED Provider Notes (Signed)
[facility] Provider Note    Event Date/Time   First MD Initiated Contact with Patient 01/04/23 1214     (approximate)  History   Chief Complaint: Emesis  HPI  Judy [name] is a 89 y.o. female with a past medical history of hypertension, hyperlipidemia, presents to the emergency department for intermittent nausea.  According to the patient over the past 3 weeks she has intermittently been nauseated.  States she will not throw up unless she tries to make herself throw up.  Denies any diarrhea.  States brown stool denies any black stool.  Has also been using Pepto-Bismol over the last several weeks.  Patient denies any abdominal pain at any point.  No fever.  Physical Exam   Triage Vital Signs: ED Triage Vitals  Encounter Vitals Group     BP 01/04/23 1131 (!) 155/94     Systolic BP Percentile --      Diastolic BP Percentile --      Pulse Rate 01/04/23 1131 100     Resp 01/04/23 1131 18     Temp 01/04/23 1131 98 F (36.7 C)     Temp src --      SpO2 01/04/23 1131 98 %     Weight 01/04/23 1132 130 lb (59 kg)     Height 01/04/23 1132 5\' 6"  (1.676 m)     Head Circumference --      Peak Flow --      Pain Score 01/04/23 1132 0     Pain Loc --      Pain Education --      Exclude from Growth Chart --     Most recent vital signs: Vitals:   01/04/23 1131  BP: (!) 155/94  Pulse: 100  Resp: 18  Temp: 98 F (36.7 C)  SpO2: 98%    General: Awake, no distress.  CV:  Good peripheral perfusion.  Regular rate and rhythm  Resp:  Normal effort.  Equal breath sounds bilaterally.  Abd:  No distention.  Soft, nontender.  No rebound or guarding. Benign abdomen   ED Results / Procedures / Treatments   EKG  EKG viewed and interpreted by myself shows a sinus rhythm at 94 bpm with a slightly widened QRS, left axis deviation, largely normal intervals with nonspecific ST changes.  MEDICATIONS ORDERED IN ED: Medications - No data to  display   IMPRESSION / MDM / ASSESSMENT AND PLAN / ED COURSE  I reviewed the triage vital signs and the nursing notes.  Patient's presentation is most consistent with acute presentation with potential threat to life or bodily function.  Patient presents to the emergency department for intermittent nausea over the last 2 to 3 weeks.  Patient denies any chest pain denies any shortness of breath.  Patient states at times she feels some discomfort in the abdomen and makes herself vomit which relieves her symptoms and she will feel better.  Patient has been using Pepto-Bismol intermittently with some success.  Patient's lab work is reassuring, chemistry shows no significant findings patient's anion gap is normal at 5.  CBC does show slight anemia down from several years ago.  Lipase is normal.  Given the patient's symptoms discussed the possibility of gastritis or peptic ulcer disease.  Recommended a rectal examination to check for blood in the stool, patient adamantly denies her being any bloody stool or dark stool and does not want a rectal exam.  States she would not want a colonoscopy  or any other type of procedure at her age.  Given the patient's reassuring lab work reassuring physical exam I believe it would be warranted for a trial of Zofran to be used if needed as well as Protonix each morning for the next 2 months.  If the patient continues to have issues she will return to the emergency department for a more in-depth workup.  Patient and daughter are agreeable with this plan of care.  Patient will follow-up with her doctor.  FINAL CLINICAL IMPRESSION(S) / ED DIAGNOSES   Nausea  Rx / DC Orders   Zofran Protonix  Note:  This document was prepared using Dragon voice recognition software and may include unintentional dictation errors.   Minna Antis, MD 01/04/23 [phone]

## 2023-01-06 ENCOUNTER — Encounter: Payer: Self-pay | Admitting: Cardiology

## 2023-01-06 ENCOUNTER — Ambulatory Visit (INDEPENDENT_AMBULATORY_CARE_PROVIDER_SITE_OTHER): Payer: Medicare Other | Admitting: Cardiology

## 2023-01-06 VITALS — BP 110/63 | HR 105 | Ht 67.0 in | Wt 132.0 lb

## 2023-01-06 DIAGNOSIS — K219 Gastro-esophageal reflux disease without esophagitis: Secondary | ICD-10-CM

## 2023-01-06 MED ORDER — SUCRALFATE 1 GM/10ML PO SUSP: 1.0000 g | Freq: Three times a day (TID) | ORAL | 2 refills | Status: DC

## 2023-01-06 NOTE — Progress Notes (Signed)
Established Patient Office Visit  Subjective:  Patient ID: Judy [name], female    DOB: 1926/05/30  Age: 89 y.o. MRN: [id number]  Chief Complaint  Patient presents with   Gastroesophageal Reflux    Weak , throwing up , acid reflux , short of breath for 3 weeks ago . Went to ER 01/04/2023     Patient in office complaining of feeling poorly for the past 3 weeks. States she eats, feels full, so she makes herself vomit, has acid reflux. Having regular bowel movements. Patient went to ED on 01/04/23. Prescribed pantoprazole once daily, which she was already taking twice daily, and Zofran. Continue pantoprazole twice daily, add Carafate. Take Zofran as needed. Avoid making self vomit. Discussed referral to GI for endoscopy or colonoscopy. Patient declines.    No other concerns at this time.   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   transferred from [facility] to [facility] on 10/12/19, discharged from ED    Past Surgical History:  Procedure Laterality Date   CATARACT EXTRACTION W/PHACO Left 01/21/2020   Procedure: CATARACT EXTRACTION PHACO AND INTRAOCULAR LENS PLACEMENT (IOC) LEFT;  Surgeon: Nevada Crane, MD;  Location: [facility];  Service: Ophthalmology;  Laterality: Left;   CATARACT EXTRACTION W/PHACO Right 03/10/2020   Procedure: CATARACT EXTRACTION PHACO AND INTRAOCULAR LENS PLACEMENT (IOC) RIGHT 4.49  00:44.5;  Surgeon: Nevada Crane, MD;  Location: [facility];  Service: Ophthalmology;  Laterality: Right;   TOTAL HIP ARTHROPLASTY Right 2009    Social History   Socioeconomic History   Marital status: Married    Spouse name: Not on file   Number of children: Not on file   Years of education: Not on file   Highest education level: Not on file  Occupational History   Not on file  Tobacco Use   Smoking status: Never   Smokeless tobacco: Never  Substance and  Sexual Activity   Alcohol use: No   Drug use: Never   Sexual activity: Not on file  Other Topics Concern   Not on file  Social History Narrative   Not on file   Social Determinants of Health   Financial Resource Strain: Not on file  Food Insecurity: Not on file  Transportation Needs: Not on file  Physical Activity: Not on file  Stress: Not on file  Social Connections: Unknown (10/27/2021)   Received from [facility]   Social Network    Social Network: Not on file  Intimate Partner Violence: Unknown (09/18/2021)   Received from [facility]   HITS    Physically Hurt: Not on file    Insult or Talk Down To: Not on file    Threaten Physical Harm: Not on file    Scream or Curse: Not on file    No family history on file.  No Known Allergies  Review of Systems  Constitutional: Negative.   HENT: Negative.    Eyes: Negative.   Respiratory:  Positive for shortness of breath.   Cardiovascular: Negative.   Gastrointestinal:  Positive for heartburn and vomiting. Negative for abdominal pain, blood in stool, constipation, diarrhea and nausea.  Genitourinary: Negative.   Musculoskeletal:  Negative for joint pain and myalgias.  Skin: Negative.   Neurological: Negative.  Negative for dizziness and headaches.  Endo/Heme/Allergies: Negative.   All other systems reviewed and are negative.  Objective:   BP 110/63   Pulse (!) 105   Ht 5\' 7"  (1.702 m)   Wt 132 lb (59.9 kg)   SpO2 96%   BMI 20.67 kg/m   Vitals:   01/06/23 1413  BP: 110/63  Pulse: (!) 105  Height: 5\' 7"  (1.702 m)  Weight: 132 lb (59.9 kg)  SpO2: 96%  BMI (Calculated): 20.67    Physical Exam Vitals and nursing note reviewed.  Constitutional:      Appearance: Normal appearance. She is normal weight.  HENT:     Head: Normocephalic and atraumatic.     Nose: Nose normal.     Mouth/Throat:     Mouth: Mucous membranes are moist.  Eyes:     Extraocular Movements:  Extraocular movements intact.     Conjunctiva/sclera: Conjunctivae normal.     Pupils: Pupils are equal, round, and reactive to light.  Cardiovascular:     Rate and Rhythm: Normal rate and regular rhythm.     Pulses: Normal pulses.     Heart sounds: Normal heart sounds.  Pulmonary:     Effort: Pulmonary effort is normal.     Breath sounds: Normal breath sounds.  Abdominal:     General: Abdomen is flat. Bowel sounds are normal.     Palpations: Abdomen is soft.  Musculoskeletal:        General: Normal range of motion.     Cervical back: Normal range of motion.  Skin:    General: Skin is warm and dry.  Neurological:     General: No focal deficit present.     Mental Status: She is alert and oriented to person, place, and time.  Psychiatric:        Mood and Affect: Mood normal.        Behavior: Behavior normal.        Thought Content: Thought content normal.        Judgment: Judgment normal.      No results found for any visits on 01/06/23.  Recent Results (from the past 2160 hour(s))  Lipase, blood     Status: None   Collection Time: 01/04/23 11:34 AM  Result Value Ref Range   Lipase 44 11 - 51 U/L    Comment: Performed at [facility], [address] [place], [place] [address]  Comprehensive metabolic panel     Status: Abnormal   Collection Time: 01/04/23 11:34 AM  Result Value Ref Range   Sodium 136 135 - 145 mmol/L   Potassium 4.1 3.5 - 5.1 mmol/L   Chloride 108 98 - 111 mmol/L   CO2 23 22 - 32 mmol/L   Glucose, Bld 122 (H) 70 - 99 mg/dL    Comment: Glucose reference range applies only to samples taken after fasting for at least 8 hours.   BUN 25 (H) 8 - 23 mg/dL   Creatinine, Ser 8.41 0.44 - 1.00 mg/dL   Calcium 7.4 (L) 8.9 - 10.3 mg/dL   Total Protein 4.4 (L) 6.5 - 8.1 g/dL   Albumin 2.0 (L) 3.5 - 5.0 g/dL   AST 18 15 - 41 U/L   ALT 12 0 - 44 U/L   Alkaline Phosphatase 68 38 - 126 U/L   Total Bilirubin 0.4 0.3 - 1.2 mg/dL   GFR, Estimated 53 (L) >60  mL/min    Comment: (NOTE) Calculated using the CKD-EPI Creatinine Equation (2021)    Anion gap 5 5 - 15    Comment: Performed at [facility], [address] [place], [place] [address]  CBC     Status: Abnormal   Collection Time: 01/04/23 11:34 AM  Result Value Ref Range   WBC 12.3 (H) 4.0 - 10.5 K/uL   RBC 3.33 (L) 3.87 - 5.11 MIL/uL   Hemoglobin 9.9 (L) 12.0 - 15.0 g/dL   HCT 60.4 (L) 54.0 - 98.1 %   MCV 92.8 80.0 - 100.0 fL   MCH 29.7 26.0 - 34.0 pg   MCHC 32.0 30.0 - 36.0 g/dL   RDW 19.1 47.8 - 29.5 %   Platelets 674 (H) 150 - 400 K/uL   nRBC 0.0 0.0 - 0.2 %    Comment: Performed at [facility], [address], [place], [place] [address]      Assessment & Plan:  Continue pantoprazole twice daily. Zofran as needed. Add Carafate.  Avoid making self vomit.   Problem List Items Addressed This Visit       Digestive   Gastroesophageal reflux disease without esophagitis - Primary   Relevant Medications   sucralfate (CARAFATE) 1 GM/10ML suspension    Return if symptoms worsen or fail to improve, for keep appt 8/19 with CB.   Total time spent: 25 minutes  [employer], NP  01/06/2023   This document may have been prepared by Dragon Voice Recognition software and as such may include unintentional dictation errors.

## 2023-01-10 ENCOUNTER — Other Ambulatory Visit: Payer: Self-pay

## 2023-01-10 ENCOUNTER — Emergency Department: Payer: Medicare Other

## 2023-01-10 ENCOUNTER — Inpatient Hospital Stay: Payer: Medicare Other

## 2023-01-10 ENCOUNTER — Observation Stay
Admission: EM | Admit: 2023-01-10 | Discharge: 2023-01-11 | Disposition: A | Discharge status: Home / Self Care | Payer: Medicare Other | Attending: Internal Medicine | Admitting: Internal Medicine

## 2023-01-10 DIAGNOSIS — K5900 Constipation, unspecified: Secondary | ICD-10-CM | POA: Insufficient documentation

## 2023-01-10 DIAGNOSIS — K5901 Slow transit constipation: Secondary | ICD-10-CM | POA: Insufficient documentation

## 2023-01-10 DIAGNOSIS — D72829 Elevated white blood cell count, unspecified: Secondary | ICD-10-CM | POA: Insufficient documentation

## 2023-01-10 DIAGNOSIS — E8729 Other acidosis: Secondary | ICD-10-CM | POA: Diagnosis not present

## 2023-01-10 DIAGNOSIS — Z7982 Long term (current) use of aspirin: Secondary | ICD-10-CM | POA: Diagnosis not present

## 2023-01-10 DIAGNOSIS — R531 Weakness: Secondary | ICD-10-CM

## 2023-01-10 DIAGNOSIS — Z96651 Presence of right artificial knee joint: Secondary | ICD-10-CM | POA: Insufficient documentation

## 2023-01-10 DIAGNOSIS — E872 Acidosis, unspecified: Secondary | ICD-10-CM | POA: Diagnosis not present

## 2023-01-10 DIAGNOSIS — A419 Sepsis, unspecified organism: Secondary | ICD-10-CM | POA: Diagnosis not present

## 2023-01-10 DIAGNOSIS — K5641 Fecal impaction: Secondary | ICD-10-CM

## 2023-01-10 DIAGNOSIS — I10 Essential (primary) hypertension: Secondary | ICD-10-CM | POA: Diagnosis present

## 2023-01-10 DIAGNOSIS — E785 Hyperlipidemia, unspecified: Secondary | ICD-10-CM | POA: Diagnosis present

## 2023-01-10 DIAGNOSIS — R932 Abnormal findings on diagnostic imaging of liver and biliary tract: Secondary | ICD-10-CM | POA: Diagnosis not present

## 2023-01-10 LAB — CBC
HCT: 31.9 % — ABNORMAL LOW (ref 36.0–46.0)
Hemoglobin: 9.9 g/dL — ABNORMAL LOW (ref 12.0–15.0)
MCH: 29.5 pg (ref 26.0–34.0)
MCHC: 31 g/dL (ref 30.0–36.0)
MCV: 94.9 fL (ref 80.0–100.0)
Platelets: 715 10*3/uL — ABNORMAL HIGH (ref 150–400)
RBC: 3.36 MIL/uL — ABNORMAL LOW (ref 3.87–5.11)
RDW: 16.4 % — ABNORMAL HIGH (ref 11.5–15.5)
WBC: 20.9 10*3/uL — ABNORMAL HIGH (ref 4.0–10.5)
nRBC: 0.3 % — ABNORMAL HIGH (ref 0.0–0.2)

## 2023-01-10 LAB — LACTIC ACID, PLASMA
Lactic Acid, Venous: 2.5 mmol/L (ref 0.5–1.9)
Lactic Acid, Venous: 3.9 mmol/L (ref 0.5–1.9)
Lactic Acid, Venous: 3.5 mmol/L (ref 0.5–1.9)
Lactic Acid, Venous: 1.5 mmol/L (ref 0.5–1.9)

## 2023-01-10 LAB — HEPATIC FUNCTION PANEL
ALT: 14 U/L (ref 0–44)
AST: 18 U/L (ref 15–41)
Albumin: 1.6 g/dL — ABNORMAL LOW (ref 3.5–5.0)
Alkaline Phosphatase: 66 U/L (ref 38–126)
Bilirubin, Direct: 0.2 mg/dL (ref 0.0–0.2)
Indirect Bilirubin: 0.4 mg/dL (ref 0.3–0.9)
Total Bilirubin: 0.6 mg/dL (ref 0.3–1.2)
Total Protein: 3.7 g/dL — ABNORMAL LOW (ref 6.5–8.1)

## 2023-01-10 LAB — URINALYSIS, ROUTINE W REFLEX MICROSCOPIC
Bilirubin Urine: NEGATIVE
Glucose, UA: NEGATIVE mg/dL
Hgb urine dipstick: NEGATIVE
Ketones, ur: 5 mg/dL — AB
Leukocytes,Ua: NEGATIVE
Nitrite: NEGATIVE
Protein, ur: NEGATIVE mg/dL
Specific Gravity, Urine: 1.028 (ref 1.005–1.030)
pH: 5 (ref 5.0–8.0)

## 2023-01-10 LAB — BASIC METABOLIC PANEL
Anion gap: 8 (ref 5–15)
BUN: 30 mg/dL — ABNORMAL HIGH (ref 8–23)
CO2: 21 mmol/L — ABNORMAL LOW (ref 22–32)
Calcium: 7.6 mg/dL — ABNORMAL LOW (ref 8.9–10.3)
Chloride: 108 mmol/L (ref 98–111)
Creatinine, Ser: 0.97 mg/dL (ref 0.44–1.00)
GFR, Estimated: 54 mL/min — ABNORMAL LOW (ref >60)
Glucose, Bld: 120 mg/dL — ABNORMAL HIGH (ref 70–99)
Potassium: 4.1 mmol/L (ref 3.5–5.1)
Sodium: 137 mmol/L (ref 135–145)

## 2023-01-10 LAB — LIPASE, BLOOD: Lipase: 36 U/L (ref 11–51)

## 2023-01-10 MED ORDER — VANCOMYCIN HCL 1250 MG/250ML IV SOLN
1250.0000 mg | Freq: Once | INTRAVENOUS | Status: AC
  Administered 2023-01-10: 1250 mg via INTRAVENOUS
  Filled 2023-01-10: qty 250

## 2023-01-10 MED ORDER — BISACODYL 5 MG PO TBEC: 5.0000 mg | DELAYED_RELEASE_TABLET | Freq: Every day | ORAL | Status: DC | PRN

## 2023-01-10 MED ORDER — IOHEXOL 300 MG/ML  SOLN
75.0000 mL | Freq: Once | INTRAMUSCULAR | Status: AC | PRN
  Administered 2023-01-10: 75 mL via INTRAVENOUS

## 2023-01-10 MED ORDER — METRONIDAZOLE 500 MG/100ML IV SOLN
500.0000 mg | Freq: Two times a day (BID) | INTRAVENOUS | Status: DC
  Administered 2023-01-10 – 2023-01-11 (×2): 500 mg via INTRAVENOUS
  Filled 2023-01-10 (×4): qty 100

## 2023-01-10 MED ORDER — METRONIDAZOLE 500 MG/100ML IV SOLN
500.0000 mg | Freq: Once | INTRAVENOUS | Status: AC
  Administered 2023-01-10: 500 mg via INTRAVENOUS
  Filled 2023-01-10: qty 100

## 2023-01-10 MED ORDER — SODIUM CHLORIDE 0.9 % IV BOLUS
500.0000 mL | Freq: Once | INTRAVENOUS | Status: AC
  Administered 2023-01-10: 500 mL via INTRAVENOUS

## 2023-01-10 MED ORDER — VANCOMYCIN HCL IN DEXTROSE 1-5 GM/200ML-% IV SOLN: 1000.0000 mg | Freq: Once | INTRAVENOUS | Status: DC

## 2023-01-10 MED ORDER — LACTATED RINGERS IV BOLUS (SEPSIS)
1000.0000 mL | Freq: Once | INTRAVENOUS | Status: AC
  Administered 2023-01-10: 1000 mL via INTRAVENOUS

## 2023-01-10 MED ORDER — LACTATED RINGERS IV SOLN
150.0000 mL/h | INTRAVENOUS | Status: DC
  Administered 2023-01-10 – 2023-01-11 (×2): 150 mL/h via INTRAVENOUS

## 2023-01-10 MED ORDER — VITAMIN D 25 MCG (1000 UNIT) PO TABS
1000.0000 [IU] | ORAL_TABLET | Freq: Every day | ORAL | Status: DC
  Administered 2023-01-10 – 2023-01-11 (×2): 1000 [IU] via ORAL
  Filled 2023-01-10 (×2): qty 1

## 2023-01-10 MED ORDER — SENNA 8.6 MG PO TABS
1.0000 | ORAL_TABLET | Freq: Two times a day (BID) | ORAL | Status: DC
  Administered 2023-01-10 – 2023-01-11 (×2): 8.6 mg via ORAL
  Filled 2023-01-10 (×2): qty 1

## 2023-01-10 MED ORDER — SODIUM CHLORIDE 0.9 % IV SOLN
2.0000 g | Freq: Once | INTRAVENOUS | Status: AC
  Administered 2023-01-10: 2 g via INTRAVENOUS
  Filled 2023-01-10: qty 12.5

## 2023-01-10 MED ORDER — DOCUSATE SODIUM 100 MG PO CAPS
100.0000 mg | ORAL_CAPSULE | Freq: Two times a day (BID) | ORAL | Status: DC
  Administered 2023-01-10 – 2023-01-11 (×2): 100 mg via ORAL
  Filled 2023-01-10 (×2): qty 1

## 2023-01-10 MED ORDER — ONDANSETRON HCL 4 MG/2ML IJ SOLN
4.0000 mg | Freq: Four times a day (QID) | INTRAMUSCULAR | Status: DC | PRN
  Administered 2023-01-11: 4 mg via INTRAVENOUS
  Filled 2023-01-10: qty 2

## 2023-01-10 MED ORDER — ASPIRIN 81 MG PO TBEC
81.0000 mg | DELAYED_RELEASE_TABLET | Freq: Every day | ORAL | Status: DC
  Administered 2023-01-10 – 2023-01-11 (×2): 81 mg via ORAL
  Filled 2023-01-10 (×2): qty 1

## 2023-01-10 MED ORDER — CALCIUM CARBONATE ANTACID 500 MG PO CHEW
500.0000 mg | CHEWABLE_TABLET | Freq: Two times a day (BID) | ORAL | Status: DC
  Administered 2023-01-10 – 2023-01-11 (×2): 500 mg via ORAL
  Filled 2023-01-10 (×2): qty 3

## 2023-01-10 MED ORDER — FLUTICASONE PROPIONATE 50 MCG/ACT NA SUSP
1.0000 | Freq: Every day | NASAL | Status: DC
  Filled 2023-01-10 (×2): qty 16

## 2023-01-10 MED ORDER — SODIUM CHLORIDE 0.9 % IV SOLN
2.0000 g | Freq: Two times a day (BID) | INTRAVENOUS | Status: DC
  Administered 2023-01-11 (×2): 2 g via INTRAVENOUS
  Filled 2023-01-10 (×2): qty 12.5

## 2023-01-10 MED ORDER — SUCRALFATE 1 GM/10ML PO SUSP
1.0000 g | Freq: Three times a day (TID) | ORAL | Status: DC
  Administered 2023-01-10 – 2023-01-11 (×2): 1 g via ORAL
  Filled 2023-01-10 (×5): qty 10

## 2023-01-10 MED ORDER — FLEET ENEMA 7-19 GM/118ML RE ENEM: 1.0000 | ENEMA | Freq: Once | RECTAL | Status: DC | PRN

## 2023-01-10 MED ORDER — ENOXAPARIN SODIUM 40 MG/0.4ML IJ SOSY
40.0000 mg | PREFILLED_SYRINGE | INTRAMUSCULAR | Status: DC
  Administered 2023-01-10: 40 mg via SUBCUTANEOUS
  Filled 2023-01-10: qty 0.4

## 2023-01-10 MED ORDER — ADULT MULTIVITAMIN W/MINERALS CH
1.0000 | ORAL_TABLET | Freq: Every day | ORAL | Status: DC
  Administered 2023-01-10 – 2023-01-11 (×2): 1 via ORAL
  Filled 2023-01-10 (×2): qty 1

## 2023-01-10 MED ORDER — ONDANSETRON HCL 4 MG PO TABS: 4.0000 mg | ORAL_TABLET | Freq: Four times a day (QID) | ORAL | Status: DC | PRN

## 2023-01-10 MED ORDER — LIDOCAINE VISCOUS HCL 2 % MT SOLN
15.0000 mL | Freq: Once | OROMUCOSAL | Status: AC
  Administered 2023-01-10: 15 mL via OROMUCOSAL
  Filled 2023-01-10: qty 15

## 2023-01-10 MED ORDER — ALBUTEROL SULFATE (2.5 MG/3ML) 0.083% IN NEBU: 2.5000 mg | INHALATION_SOLUTION | RESPIRATORY_TRACT | Status: DC | PRN

## 2023-01-10 MED ORDER — LACTATED RINGERS IV SOLN: INTRAVENOUS | Status: AC

## 2023-01-10 MED ORDER — PANTOPRAZOLE SODIUM 40 MG PO TBEC
40.0000 mg | DELAYED_RELEASE_TABLET | Freq: Every day | ORAL | Status: DC
  Administered 2023-01-10 – 2023-01-11 (×2): 40 mg via ORAL
  Filled 2023-01-10 (×2): qty 1

## 2023-01-10 NOTE — ED Notes (Signed)
First Nurse Note: Patient to ED via [facility] from home for constipation. Patient was seen here last Tuesday for N/V and given meds. Patient has not had a BM in 5 days. No eating/drinking as much as normal.

## 2023-01-10 NOTE — ED Provider Notes (Signed)
[facility] Provider Note    Event Date/Time   First MD Initiated Contact with Patient 01/10/23 1122     (approximate)   History   Constipation   HPI  Judy [name] is a 89 y.o. female who presents to the emergency department today with primary concerns for constipation.  Patient's last bowel movement was almost 1 week ago.  She is having discomfort in her rectum.  Additionally family states they have noticed that the patient has been more fatigued than normal recently.  Patient however denies any fevers.  No shortness of breath.  Has not noticed any change in urination.     Physical Exam   Triage Vital Signs: ED Triage Vitals  Encounter Vitals Group     BP 01/10/23 1021 110/68     Systolic BP Percentile --      Diastolic BP Percentile --      Pulse Rate 01/10/23 1021 (!) 107     Resp 01/10/23 1021 18     Temp 01/10/23 1021 97.9 F (36.6 C)     Temp Source 01/10/23 1021 Oral     SpO2 01/10/23 1021 100 %     Weight 01/10/23 1022 132 lb 0.9 oz (59.9 kg)     Height 01/10/23 1022 5\' 7"  (1.702 m)     Head Circumference --      Peak Flow --      Pain Score 01/10/23 1022 0     Pain Loc --      Pain Education --      Exclude from Growth Chart --     Most recent vital signs: Vitals:   01/10/23 1411 01/10/23 1430  BP:    Pulse:    Resp: 17 15  Temp:    SpO2:     General: Awake, alert, oriented. CV:  Good peripheral perfusion. Regular rate and rhythm. Resp:  Normal effort. Lungs clear. Abd:  No distention.  Other:  Hard stool in rectum.   ED Results / Procedures / Treatments   Labs (all labs ordered are listed, but only abnormal results are displayed) Labs Reviewed  CBC - Abnormal; Notable for the following components:      Result Value   WBC 20.9 (*)    RBC 3.36 (*)    Hemoglobin 9.9 (*)    HCT 31.9 (*)    RDW 16.4 (*)    Platelets 715 (*)    nRBC 0.3 (*)    All other components within normal limits  BASIC METABOLIC PANEL  - Abnormal; Notable for the following components:   CO2 21 (*)    Glucose, Bld 120 (*)    BUN 30 (*)    Calcium 7.6 (*)    GFR, Estimated 54 (*)    All other components within normal limits  LACTIC ACID, PLASMA - Abnormal; Notable for the following components:   Lactic Acid, Venous 2.5 (*)    All other components within normal limits  LACTIC ACID, PLASMA - Abnormal; Notable for the following components:   Lactic Acid, Venous 3.9 (*)    All other components within normal limits  URINALYSIS, ROUTINE W REFLEX MICROSCOPIC - Abnormal; Notable for the following components:   Color, Urine YELLOW (*)    APPearance HAZY (*)    Ketones, ur 5 (*)    All other components within normal limits  CULTURE, BLOOD (ROUTINE X 2)  CULTURE, BLOOD (ROUTINE X 2)     EKG  None  RADIOLOGY I independently interpreted and visualized the CXR. My interpretation: No pneumonia Radiology interpretation:  IMPRESSION:  Probable atelectatic changes at the left lung base with associated  trace left pleural effusion.    I independently interpreted and visualized the CT abd/pel. My interpretation: No free air Radiology interpretation:  IMPRESSION:  1. No acute inflammatory process within the abdomen or pelvis.  2. Rectal fecal impaction. Moderate stool burden. No bowel  obstruction.  3. Masslike area in the gallbladder lumen. Nonemergent right upper  quadrant ultrasound is recommended for further evaluation.  4. Bilateral small pleural effusions.  5. Multiple other nonacute observations, as described above.      PROCEDURES:  Critical Care performed: Yes  CRITICAL CARE Performed by: Phineas Semen   Total critical care time: 35 minutes  Critical care time was exclusive of separately billable procedures and treating other patients.  Critical care was necessary to treat or prevent imminent or life-threatening deterioration.  Critical care was time spent personally by me on the following  activities: development of treatment plan with patient and/or surrogate as well as nursing, discussions with consultants, evaluation of patient's response to treatment, examination of patient, obtaining history from patient or surrogate, ordering and performing treatments and interventions, ordering and review of laboratory studies, ordering and review of radiographic studies, pulse oximetry and re-evaluation of patient's condition.   Procedures  ------------------------------------------------------------------------------------------------------------------- Fecal Disimpaction Procedure Note:  Performed by me:  Patient placed in the lateral recumbent position with knees drawn towards chest. Nurse present for patient support. Large amount of hard brown stool removed. No complications during procedure.   ------------------------------------------------------------------------------------------------------------------    MEDICATIONS ORDERED IN ED: Medications  lactated ringers infusion ( Intravenous New Bag/Given 01/10/23 1501)  sodium chloride 0.9 % bolus 500 mL (0 mLs Intravenous Stopped 01/10/23 1257)  iohexol (OMNIPAQUE) 300 MG/ML solution 75 mL (75 mLs Intravenous Contrast Given 01/10/23 1212)  lactated ringers bolus 1,000 mL (0 mLs Intravenous Stopped 01/10/23 1549)    And  lactated ringers bolus 1,000 mL (0 mLs Intravenous Stopped 01/10/23 1411)  ceFEPIme (MAXIPIME) 2 g in sodium chloride 0.9 % 100 mL IVPB (0 g Intravenous Stopped 01/10/23 1328)  metroNIDAZOLE (FLAGYL) IVPB 500 mg (0 mg Intravenous Stopped 01/10/23 1344)  vancomycin (VANCOREADY) IVPB 1250 mg/250 mL (0 mg Intravenous Stopped 01/10/23 1513)  lidocaine (XYLOCAINE) 2 % viscous mouth solution 15 mL (15 mLs Mouth/Throat Given 01/10/23 1500)     IMPRESSION / MDM / ASSESSMENT AND PLAN / ED COURSE  I reviewed the triage vital signs and the nursing notes.                              Differential diagnosis includes, but is not  limited to, constipation, obstruction  Patient's presentation is most consistent with acute presentation with potential threat to life or bodily function.   The patient is on the cardiac monitor to evaluate for evidence of arrhythmia and/or significant heart rate changes.  Patient presented to the emergency department today because of concern for constipation.  She was found to have a fecal impaction.  I did try disimpaction however patient tolerated this poorly.  We then tried enema which patient cannot hold in for any significant amount of time.  However while here we did check blood work given reported history of weakness.  This did show significant leukocytosis as well as lactic acidosis.  At that time I ordered broad-spectrum antibiotics and IV fluids.  No clear  source.  After IV fluids repeat lactic in fact was worse.  CT scan of the abdomen pelvis without clear source of the infection.  Chest x-ray without pneumonia.  UA without signs for infection.  Will however plan on admission to the hospitalist service for further workup and evaluation.      FINAL CLINICAL IMPRESSION(S) / ED DIAGNOSES   Final diagnoses:  Fecal impaction in rectum (HCC)  Lactic acidosis  Leukocytosis, unspecified type     Note:  This document was prepared using Dragon voice recognition software and may include unintentional dictation errors.    Phineas Semen, MD 01/10/23 [phone]

## 2023-01-10 NOTE — Consult Note (Signed)
PHARMACY -  BRIEF ANTIBIOTIC NOTE   Pharmacy has received consult(s) for vancomycin and cefepime dosing from an ED provider.  The patient's profile has been reviewed for ht/wt/allergies/indication/available labs.    One time order(s) placed for vancomycin 1250 mg IV x 1 and cefepime 2 grams IV x 1  Further antibiotics/pharmacy consults should be ordered by admitting physician if indicated.                       Thank you, Barrie Folk, PharmD 01/10/2023  12:37 PM

## 2023-01-10 NOTE — Sepsis Progress Note (Signed)
Elink will follow per sepsis protocol  

## 2023-01-10 NOTE — Consult Note (Signed)
CODE SEPSIS - PHARMACY COMMUNICATION  **Broad Spectrum Antibiotics should be administered within 1 hour of Sepsis diagnosis**  Time Code Sepsis Called/Page Received: 1235  Antibiotics Ordered: cefepime, vancomycin, and Flagyl  Time of 1st antibiotic administration: 1257  Additional action taken by pharmacy: N/A  Barrie Folk ,PharmD Clinical Pharmacist  01/10/2023  12:39 PM

## 2023-01-10 NOTE — ED Triage Notes (Signed)
Pt here with constipation x 1 week.. Pt states she has not had a bowel movement since last Tues. Pt states she was here recently.Pt denies any other symptoms.

## 2023-01-10 NOTE — ED Notes (Signed)
Patient family brought back to this RN in waiting room. Stating patient is unable to see and more weak. Patient taken to room. Primary RN aware.

## 2023-01-10 NOTE — Consult Note (Signed)
Pharmacy Antibiotic Note  Judy [name] is a 89 y.o. female admitted on 01/10/2023 with sepsis.  Pharmacy has been consulted for cefepime dosing.  Plan: Start cefepime 2 grams IV every 12 hours. Follow renal function for adjustments  Height: 5\' 7"  (170.2 cm) Weight: 59.9 kg (132 lb 0.9 oz) IBW/kg (Calculated) : 61.6  Temp (24hrs), Avg:97.9 F (36.6 C), Min:97.9 F (36.6 C), Max:97.9 F (36.6 C)  Recent Labs  Lab 01/04/23 1134 01/10/23 1022 01/10/23 1142 01/10/23 1344  WBC 12.3* 20.9*  --   --   CREATININE 0.98 0.97  --   --   LATICACIDVEN  --   --  2.5* 3.9*    Estimated Creatinine Clearance: 33.5 mL/min (by C-G formula based on SCr of 0.97 mg/dL).    No Known Allergies  Antimicrobials this admission: cefepime 7/29 >>  Vancomycin and Flagyl x 1 7/29  Dose adjustments this admission: N/A  Microbiology results: 7/29 BCx: pending   Thank you for allowing pharmacy to be a part of this patient's care.  Barrie Folk, PharmD 01/10/2023 4:43 PM

## 2023-01-10 NOTE — ED Notes (Signed)
Pt had large bowel movement post enema. Pt states she feels much better. Pt denies adb pain at this time.

## 2023-01-10 NOTE — H&P (Signed)
History and Physical    Patient: Judy [name] ZOX:[id number] DOB: [birth year] DOA: 01/10/2023 DOS: the patient was seen and examined on 01/10/2023 PCP: Orson Eva, NP  Patient coming from: Home  Chief Complaint:  Chief Complaint  Patient presents with   Constipation   HPI: Judy [name] is a 89 y.o. female with medical history significant of hypertension, hyperlipidemia presented to the emergency department for evaluation of severe constipation.  Patient's last bowel movement a week ago, did have rectal discomfort.  Patient has been weak.  Denies any fever, chills, rigors, shortness of breath or chest pain.  Patient was seen in the ED 5 days ago for nausea, acid reflux symptoms where she was given IV Zofran, Protonix with improvement of symptoms and was discharged home.  In the emergency department-patient is noted to be afebrile, tachycardic with pulse rate 107.  Laboratory findings showed WBC 20.9, lactic acid 2 point 5 repeat one 3.9.  Chest x-ray showed possible atelectasis.  CT abdomen pelvis showed no acute inflammatory process, rectal fecal impaction, moderate stool burden, masslike area in gallbladder lumen advised right upper quadrant sono.  ED provider performed fecal disimpaction, she got IV fluid bolus, IV antibiotics per sepsis protocol.  Patient did get enema with a good bowel movement during my exam she is more relieved and feels better. ED provider requested hospitalist admission for further management evaluation of sepsis of unknown origin.  Review of Systems: As mentioned in the history of present illness. All other systems reviewed and are negative.  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   transferred from [facility] to [facility] on 10/12/19, discharged from ED   Past Surgical History:  Procedure Laterality Date   CATARACT EXTRACTION W/PHACO Left 01/21/2020   Procedure:  CATARACT EXTRACTION PHACO AND INTRAOCULAR LENS PLACEMENT (IOC) LEFT;  Surgeon: Nevada Crane, MD;  Location: [facility];  Service: Ophthalmology;  Laterality: Left;   CATARACT EXTRACTION W/PHACO Right 03/10/2020   Procedure: CATARACT EXTRACTION PHACO AND INTRAOCULAR LENS PLACEMENT (IOC) RIGHT 4.49  00:44.5;  Surgeon: Nevada Crane, MD;  Location: [facility];  Service: Ophthalmology;  Laterality: Right;   TOTAL HIP ARTHROPLASTY Right 2009   Social History:  reports that she has never smoked. She has never used smokeless tobacco. She reports that she does not drink alcohol and does not use drugs.  No Known Allergies  History reviewed. No pertinent family history.  Prior to Admission medications   Medication Sig Start Date End Date Taking? Authorizing Provider  aspirin 81 MG tablet Take 81 mg by mouth daily.    [provider]  benzonatate (TESSALON PERLES) 100 MG capsule Take 1 capsule (100 mg total) by mouth 3 (three) times daily as needed for cough. 08/02/22 08/02/23  Orson Eva, NP  Calcium Carbonate 500 MG CHEW Chew 500 mg by mouth 2 (two) times daily.    [provider]  cholecalciferol (VITAMIN D3) 25 MCG (1000 UNIT) tablet Take 1,000 Units by mouth daily.    [provider]  diclofenac (VOLTAREN) 75 MG EC tablet Take 75 mg by mouth 2 (two) times daily. 05/11/22   [provider]  fluticasone (FLONASE) 50 MCG/ACT nasal spray Place 1 spray into both nostrils daily. 08/06/21   [provider]  loratadine (CLARITIN) 10 MG tablet TAKE 1 TABLET BY MOUTH ONCE DAILY 11/11/22   Orson Eva, NP  meloxicam (MOBIC) 7.5 MG tablet Take 7.5 mg by mouth daily. 10/02/19   [provider]  ondansetron (ZOFRAN-ODT) 4 MG disintegrating tablet Take 1 tablet (4 mg total) by mouth every 8 (eight) hours as needed for nausea or vomiting. 01/04/23   Minna Antis, MD  oxybutynin (DITROPAN) 5 MG tablet TAKE 1 TABLET BY MOUTH  TWICE DAILY. 09/20/22   Orson Eva, NP  pantoprazole (PROTONIX) 40 MG tablet Take 1 tablet (40 mg total) by mouth daily. 01/04/23 01/04/24  Minna Antis, MD  sucralfate (CARAFATE) 1 GM/10ML suspension Take 10 mLs (1 g total) by mouth 4 (four) times daily -  with meals and at bedtime. 01/06/23   Scoggins, Amber, NP  vitamin E (VITAMIN E) 180 MG (400 UNITS) capsule Take 400 Units by mouth daily.    [provider]    Physical Exam: Vitals:   01/10/23 1200 01/10/23 1330 01/10/23 1411 01/10/23 1430  BP: (!) 124/90     Pulse:      Resp: 18 16 17 15   Temp:      TempSrc:      SpO2: 99%     Weight:      Height:       General - Elderly ill Caucasian female, no apparent distress HEENT - PERRLA, EOMI, atraumatic head, non tender sinuses. Lung - Clear, rales, rhonchi, wheezes. Heart - S1, S2 heard, no murmurs, rubs, trace pedal edema. Abdomen-soft, distended, nontender Neuro - Alert, awake and oriented x 3, non focal exam. Skin - Warm and dry. Data Reviewed:     Latest Ref Rng & Units 01/10/2023   10:22 AM 01/04/2023   11:34 AM 10/18/2019    1:00 AM  CBC  WBC 4.0 - 10.5 K/uL 20.9  12.3  8.4   Hemoglobin 12.0 - 15.0 g/dL 9.9  9.9  47.4   Hematocrit 36.0 - 46.0 % 31.9  30.9  37.7   Platelets 150 - 400 K/uL 715  674  336       Latest Ref Rng & Units 01/10/2023   10:22 AM 01/04/2023   11:34 AM 08/02/2022    9:43 AM  BMP  Glucose 70 - 99 mg/dL 259  563  89   BUN 8 - 23 mg/dL 30  25  19    Creatinine 0.44 - 1.00 mg/dL 8.75  6.43  3.29   BUN/Creat Ratio 12 - 28   21   Sodium 135 - 145 mmol/L 137  136  143   Potassium 3.5 - 5.1 mmol/L 4.1  4.1  4.3   Chloride 98 - 111 mmol/L 108  108  107   CO2 22 - 32 mmol/L 21  23  22    Calcium 8.9 - 10.3 mg/dL 7.6  7.4  9.3    DG Chest Portable 1 View  Result Date: 01/10/2023 CLINICAL DATA:  weakness EXAM: PORTABLE CHEST 1 VIEW COMPARISON:  None Available. FINDINGS: Low lung volume. There are probable atelectatic changes at the left lung  base with associated trace left pleural effusion. There is apparent blunting of right lateral costophrenic angle, which may be due to overlying soft tissue. Normal cardio-mediastinal silhouette. No acute osseous abnormalities. Old nonunited fracture of lateral left clavicle noted. Note is made of bilateral high riding humeri in relation to glenoid, concerning for complete rotator cuff tear. The soft tissues are within normal limits. IMPRESSION: Probable atelectatic changes at the left lung base with associated trace left pleural effusion. Electronically Signed   By: Timoteo Expose.D.  On: 01/10/2023 15:51   CT ABDOMEN PELVIS W CONTRAST  Result Date: 01/10/2023 CLINICAL DATA:  abd pain EXAM: CT ABDOMEN AND PELVIS WITH CONTRAST TECHNIQUE: Multidetector CT imaging of the abdomen and pelvis was performed using the standard protocol following bolus administration of intravenous contrast. RADIATION DOSE REDUCTION: This exam was performed according to the departmental dose-optimization program which includes automated exposure control, adjustment of the mA and/or kV according to patient size and/or use of iterative reconstruction technique. CONTRAST:  75mL OMNIPAQUE IOHEXOL 300 MG/ML  SOLN COMPARISON:  None Available. FINDINGS: Lower chest: There are bilateral small pleural effusions. There are dependent changes in bilateral lungs. Bilateral lungs are otherwise clear. The heart is normal in size. No pericardial effusion. Hepatobiliary: The liver is normal in size. Non-cirrhotic configuration. No suspicious mass. These is mild diffuse hepatic steatosis. No intrahepatic or extrahepatic bile duct dilation. Physiologically distended gallbladder. There is a predominantly hyperattenuating approximately 2.6 x 3.1 cm rounded masslike area nearly completely filling the gallbladder lumen. There is central faint dystrophic calcification. This is incompletely characterized on the current examination and may represent  tumefactive sludge; however, nonemergent right upper quadrant ultrasound is recommended to exclude mass lesion. No pericholecystic fat stranding. Pancreas: Unremarkable. No pancreatic ductal dilatation or surrounding inflammatory changes. Spleen: Within normal limits. No focal lesion. 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 Adrenals/Urinary Tract: Adrenal glands are unremarkable. No suspicious renal mass. There is a 1.4 x 1.8 cm simple cyst arising from the left kidney upper pole, laterally. No hydronephrosis. No renal or ureteric calculi. 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 Urinary bladder is collapsed and not diagnostically evaluated due to extensive streak artifacts. Stomach/Bowel: Stomach is distended with fluid and dependent food material. There is a medium sized diverticulum arising from the second part of duodenum. No disproportionate dilation of the small or large bowel loops. No evidence of abnormal bowel wall thickening or inflammatory changes. The appendix is unremarkable. There are scattered diverticula mainly in the sigmoid colon, without imaging signs of diverticulitis. Moderate stool burden noted. 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 No abnormal wall thickening or perirectal fat stranding. Vascular/Lymphatic: No ascites or pneumoperitoneum. No abdominal or pelvic lymphadenopathy, by size criteria. No aneurysmal dilation of the major abdominal arteries. There are mild peripheral atherosclerotic vascular calcifications of the aorta and its major branches. Reproductive: Not diagnostically evaluated due to extensive streak artifacts. Other: The visualized soft tissues and abdominal wall are unremarkable. Musculoskeletal: No suspicious osseous lesions. Right total hip arthroplasty noted. There are moderate multilevel  degenerative changes in the visualized spine. IMPRESSION: 1. No acute inflammatory process within the abdomen or pelvis. 2. Rectal fecal impaction. Moderate stool burden. No bowel obstruction. 3. Masslike area in the gallbladder lumen. Nonemergent right upper quadrant ultrasound is recommended for further evaluation. 4. Bilateral small pleural effusions. 5. Multiple other nonacute observations, as described above. Electronically Signed   By: Jules Schick M.D.   On: 01/10/2023 13:28   DG Abdomen 1 View  Result Date: 01/10/2023 CLINICAL DATA:  Constipation. EXAM: ABDOMEN - 1 VIEW COMPARISON:  None Available. FINDINGS: The bowel gas pattern is normal. Large stool ball in the rectum. Mildly increased colonic stool burden elsewhere. No radio-opaque calculi or other significant radiographic abnormality are seen. No acute osseous abnormality. Prior right hip arthroplasty. IMPRESSION: 1. Large stool ball in the rectum.  Mildly increased colonic stool burden. Electronically Signed   By: Obie Dredge M.D.   On: 01/10/2023 12:05     Assessment and Plan: Judy [name] is a 89 y.o. female with medical history significant of hypertension, hyperlipidemia presented to the emergency department for evaluation of severe constipation.  Patient is noted to have high white count, lactic acid with no source of infection on imaging.  Urinalysis unremarkable.  Blood cultures sent, she started on sepsis protocol with IV hydration and IV antibiotic therapy being admitted to hospitalist service for further management evaluation.  Sepsis of unknown etiology Admit to medical floor under sepsis protocol. Sepsis order sets used. Patient will be continued on gentle IV hydration as per sepsis protocol. She will be continued on cefepime, Flagyl and vancomycin as per pharmacy protocol. Continue to follow culture results. Trend WBC, lactic acid.  Severe constipation She did have good bowel movement in the emergency  department. Will continue stool softeners, laxatives as needed. High-fiber diet suggested.  Hypertension- Patient is not on any home medications. If persistent elevated blood pressures will give IV hydralazine as needed.  GERD- Patient will be continued on PPI, sucralfate therapy. Continue IV antiemetics.  Patient's home medications including calcium vitamin D restarted. Continue nursing supportive care. DVT prophylaxis with Lovenox.   Advance Care Planning:   Code Status: DNR I discussed with patient regarding CODE STATUS, her neighbors at bedside who witnessed my discussion.  Consults: none  Family Communication: Patient and her neighbors who are family to her present during my discussion. She wishes to be DNR and no aggressive management.  Severity of Illness: The appropriate patient status for this patient is INPATIENT. Inpatient status is judged to be reasonable and necessary in order to provide the required intensity of service to ensure the patient's safety. The patient's presenting symptoms, physical exam findings, and initial radiographic and laboratory data in the context of their chronic comorbidities is felt to place them at high risk for further clinical deterioration. Furthermore, it is not anticipated that the patient will be medically stable for discharge from the hospital within 2 midnights of admission.   * I certify that at the point of admission it is my clinical judgment that the patient will require inpatient hospital care spanning beyond 2 midnights from the point of admission due to high intensity of service, high risk for further deterioration and high frequency of surveillance required.*  Author: Marcelino Duster, MD 01/10/2023 5:01 PM  For on call review [email or web address].

## 2023-01-11 DIAGNOSIS — E8729 Other acidosis: Secondary | ICD-10-CM | POA: Diagnosis not present

## 2023-01-11 DIAGNOSIS — R932 Abnormal findings on diagnostic imaging of liver and biliary tract: Secondary | ICD-10-CM | POA: Diagnosis not present

## 2023-01-11 DIAGNOSIS — K5901 Slow transit constipation: Secondary | ICD-10-CM | POA: Insufficient documentation

## 2023-01-11 DIAGNOSIS — D72829 Elevated white blood cell count, unspecified: Secondary | ICD-10-CM | POA: Diagnosis not present

## 2023-01-11 DIAGNOSIS — E872 Acidosis, unspecified: Secondary | ICD-10-CM | POA: Diagnosis not present

## 2023-01-11 DIAGNOSIS — K5641 Fecal impaction: Secondary | ICD-10-CM | POA: Diagnosis not present

## 2023-01-11 MED ORDER — BISACODYL 5 MG PO TBEC: 5.0000 mg | DELAYED_RELEASE_TABLET | Freq: Every day | ORAL | 0 refills | Status: DC | PRN

## 2023-01-11 MED ORDER — SENNA 8.6 MG PO TABS: 1.0000 | ORAL_TABLET | Freq: Two times a day (BID) | ORAL | 0 refills | Status: DC

## 2023-01-11 MED ORDER — DOCUSATE SODIUM 100 MG PO CAPS: 100.0000 mg | ORAL_CAPSULE | Freq: Two times a day (BID) | ORAL | 0 refills | Status: DC

## 2023-01-11 NOTE — Plan of Care (Signed)

## 2023-01-11 NOTE — Progress Notes (Signed)
Physical Therapy Treatment Patient Details Name: Judy [name] MRN: [id number] DOB: Jan 14, 1926 Today's Date: 01/11/2023   History of Present Illness Pt is a 89y/p female presenting with sepsis and initial complaints of severe constipation. PMH includes HTN, HLD, and GERD    PT Comments   Pt presents laying in bed with visitor in the room, no complaints of pain. Pt performed supine>sit with min/modA for trunk control with transitional movement. Pt performed sit<>stand with minA, requiring assistance for initiating standing from EOB. She tolerated ~240ft with CGA and RW, noting increased forward trunk flexion and is unable to maintain upright correction following cueing. Pt also verbalized she was having difficulty with foot stability, but notes this is normal for her and typically walks at home with shoes on to help.  Pt noted she is close to functioning at her baseline at the end of the session. Pt would benefit from continued skilled therapy to maximize functional independence.    If plan is discharge home, recommend the following: Assist for transportation;Help with stairs or ramp for entrance;Assistance with cooking/housework;A little help with bathing/dressing/bathroom;A little help with walking and/or transfers;Direct supervision/assist for medications management   Can travel by private vehicle     Yes  Equipment Recommendations  None recommended by PT    Recommendations for Other Services       Precautions / Restrictions Precautions Precautions: Fall Restrictions Weight Bearing Restrictions: No     Mobility  Bed Mobility Overal bed mobility: Needs Assistance Bed Mobility: Supine to Sit     Supine to sit: Min assist, Mod assist     General bed mobility comments: Min-modA for trunk control/ completion of movement.    Transfers Overall transfer level: Needs assistance Equipment used: Rolling walker (2 wheels) Transfers: Sit to/from Stand Sit to Stand: Min assist            General transfer comment: Assistance with initiation of standing from bed    Ambulation/Gait Ambulation/Gait assistance: Min guard Gait Distance (Feet): 200 Feet Assistive device: Rolling walker (2 wheels) Gait Pattern/deviations: Trunk flexed       General Gait Details: forward trunk lean able to be corrected in standing but maintained throughout ambulation, some difficulty with foot stability in hospital socks but verbalized she walks better with shoes on   Stairs             Wheelchair Mobility     Tilt Bed    Modified Rankin (Stroke Patients Only)       Balance Overall balance assessment: Needs assistance Sitting-balance support: Feet supported, No upper extremity supported Sitting balance-Leahy Scale: Good Sitting balance - Comments: Able to maintain sitting balance with LAQ   Standing balance support: Reliant on assistive device for balance, During functional activity Standing balance-Leahy Scale: Fair                              Cognition Arousal/Alertness: Awake/alert Behavior During Therapy: WFL for tasks assessed/performed Overall Cognitive Status: Within Functional Limits for tasks assessed                                 General Comments: A&Ox4        Exercises General Exercises - Lower Extremity Ankle Circles/Pumps: AROM, Both, 10 reps Long Arc Quad: AROM, Both, 15 reps    General Comments        Pertinent Vitals/Pain Pain  Assessment Pain Assessment: No/denies pain    Home Living Family/patient expects to be discharged to:: Private residence Living Arrangements: Alone Available Help at Discharge: Friend(s);Neighbor Type of Home: House Home Access: Stairs to enter Entrance Stairs-Rails: None (Grab bar on R side) Entrance Stairs-Number of Steps: 2   Home Layout: One level Home Equipment: Cane - single Librarian, academic (2 wheels) Additional Comments: Grab bars in bathroom, did not  specify where    Prior Function            PT Goals (current goals can now be found in the care plan section) Acute Rehab PT Goals Patient Stated Goal: return home PT Goal Formulation: With patient Time For Goal Achievement: 01/25/23 Potential to Achieve Goals: Good Progress towards PT goals: Progressing toward goals    Frequency    Min 1X/week      PT Plan Current plan remains appropriate    Co-evaluation              AM-PAC PT "6 Clicks" Mobility   Outcome Measure  Help needed turning from your back to your side while in a flat bed without using bedrails?: A Little Help needed moving from lying on your back to sitting on the side of a flat bed without using bedrails?: A Little Help needed moving to and from a bed to a chair (including a wheelchair)?: A Little Help needed standing up from a chair using your arms (e.g., wheelchair or bedside chair)?: A Little Help needed to walk in hospital room?: A Little Help needed climbing 3-5 steps with a railing? : A Lot 6 Click Score: 17    End of Session Equipment Utilized During Treatment: Gait belt Activity Tolerance: Patient tolerated treatment well Patient left: in chair;with call bell/phone within reach;with chair alarm set;with family/visitor present   PT Visit Diagnosis: Other abnormalities of gait and mobility (R26.89);Difficulty in walking, not elsewhere classified (R26.2);Muscle weakness (generalized) (M62.81)     Time: 8119-1478 PT Time Calculation (min) (ACUTE ONLY): 13 min  Charges:      PT General Charges $$ ACUTE PT VISIT: 1 Visit                     Ivo Moga, PT, SPT 1:59 PM,01/11/23

## 2023-01-11 NOTE — Care Management Obs Status (Signed)
MEDICARE OBSERVATION STATUS NOTIFICATION   Patient Details  Name: Judy [name] MRN: [id number] Date of Birth: 1925/08/23   Medicare Observation Status Notification Given:  Yes    Darolyn Rua, LCSW 01/11/2023, 1:47 PM

## 2023-01-11 NOTE — Progress Notes (Signed)
Initial Nutrition Assessment  DOCUMENTATION CODES:   Not applicable  INTERVENTION:   -Ensure Enlive po BID, each supplement provides 350 kcal and 20 grams of protein -Liberalize diet to regular for widest variety of meal selections -MVI with minerals daily -Reviewed importance of adequate oral intake and role of fluids, fiber, and stool softeners/ laxatives to assist with regular bowel movements  NUTRITION DIAGNOSIS:   Increased nutrient needs related to acute illness as evidenced by estimated needs.  GOAL:   Patient will meet greater than or equal to 90% of their needs  MONITOR:   PO intake, Supplement acceptance  REASON FOR ASSESSMENT:   Consult Assessment of nutrition requirement/status, Diet education  ASSESSMENT:   Pt with medical history significant of hypertension, hyperlipidemia presented for evaluation of severe constipation.  Pt admitted with severe constipation.   Reviewed I/O's: +3.8 L since admission  UOP: 250 ml x 24 hours   Spoke with pt and neighbor at bedside. Pt was pleasant and in good spirits today, sitting in recliner chair. Pt reports good appetite, consumed 100% of lunch. Pt shares that she has been experienced weakness and poor appetite for 2-3 weeks PTA due to feeling unwell. She explains that she got "overheated" because she was spending time in the hot weather. Pt typically consumes 2-3 meals per day (Breakfast: oatmeal and fruit Or eggs and toast; Lunch: snack; Dinner: TV dinner or takeout). Pt no longer drives, but has friends and family members who bring her food, take her out to eat, and help with grocery shopping. Pt admits that she likes cookies and ice cream and also drinks one Ensure supplement daily. Pt drinks mostly 3-4 glasses of water daily, but will also drink milk, prune juice, and soda occasionally.   Pt reports she usually has regular bowel movements, 1-2 per day, depending on what she eats. Pt shares that she has had dark bowel  movements lately secondary to taking Pepto Bismol. She has used stool softeners and laxatives in the past, stating " I know I should have, but I didn't".   Pt denies any weight loss. No wt loss noted over the past 5 months. Noted pt with edema, which may be masking true weight loss as well as fat and muscle depletions.   Discussed importance of good meal and supplement intake to promote healing. Discussed importance of regular bowel movements and consuming adequate fiber and fluids. Pt amenable to take a stool softener or laxative at home if constipated.   Medications reviewed and include calcium carbonate, vitamin D, colace, lovenox, carafate, flagyl, and lactated ringers infusion @ 150 ml/hr.   Labs reviewed.    NUTRITION - FOCUSED PHYSICAL EXAM:  Flowsheet Row Most Recent Value  Orbital Region No depletion  Upper Arm Region Mild depletion  Thoracic and Lumbar Region No depletion  Buccal Region No depletion  Temple Region Mild depletion  Clavicle Bone Region No depletion  Clavicle and Acromion Bone Region No depletion  Scapular Bone Region No depletion  Dorsal Hand Mild depletion  Patellar Region No depletion  Anterior Thigh Region No depletion  Posterior Calf Region No depletion  Edema (RD Assessment) Mild  Hair Reviewed  Eyes Reviewed  Mouth Reviewed  Skin Reviewed  Nails Reviewed       Diet Order:   Diet Order             Diet - low sodium heart healthy           Diet Heart Room service appropriate? Yes;  Fluid consistency: Thin  Diet effective now                   EDUCATION NEEDS:   Education needs have been addressed  Skin:  Skin Assessment: Reviewed RN Assessment  Last BM:  01/11/23 (type 5)  Height:   Ht Readings from Last 1 Encounters:  01/10/23 5\' 7"  (1.702 m)    Weight:   Wt Readings from Last 1 Encounters:  01/11/23 68.6 kg    Ideal Body Weight:  61.4 kg  BMI:  Body mass index is 23.69 kg/m.  Estimated Nutritional Needs:   Kcal:   1500-1700  Protein:  85-100 grams  Fluid:  > 1.5 L    Levada Schilling, RD, LDN, CDCES Registered Dietitian II Certified Diabetes Care and Education Specialist Please refer to [facility] for RD and/or RD on-call/weekend/after hours pager

## 2023-01-11 NOTE — Discharge Summary (Signed)
Physician Discharge Summary   Patient: Judy [name] MRN: [id number] DOB: 1925/08/09  Admit date:     01/10/2023  Discharge date: 01/11/23  Discharge Physician: Marcelino Duster   PCP: Orson Eva, NP   Recommendations at discharge:    PCP follow up in 1 week.  Discharge Diagnoses: Principal Problem:   Lactic acidosis Active Problems:   Hyperlipidemia   Hypertension   Abnormal gall bladder diagnostic imaging   Leukocytosis (leucocytosis)   Constipation by delayed colonic transit  Resolved Problems:   * No resolved hospital problems. *  Hospital Course: Judy [name] is a 89 y.o. female with medical history significant of hypertension, hyperlipidemia presented to the emergency department for evaluation of severe constipation.  Patient's last bowel movement a week ago, did have rectal discomfort.  Patient has been weak.  Denies any fever, chills, rigors, shortness of breath or chest pain.  Patient was seen in the ED 5 days ago for nausea, acid reflux symptoms where she was given IV Zofran, Protonix with improvement of symptoms and was discharged home.  In the emergency department-patient is noted to be afebrile, tachycardic with pulse rate 107. She is accompanied by her neighbors who are taking care of her. Laboratory findings showed WBC 20.9, lactic acid 2.5 repeat one 3.9.  Chest x-ray showed possible atelectasis.  CT abdomen pelvis showed no acute inflammatory process, rectal fecal impaction, moderate stool burden, masslike area in gallbladder lumen advised right upper quadrant sono.  ED provider performed fecal disimpaction, she got IV fluid bolus, IV antibiotics per sepsis protocol.   Patient did get enema with a good bowel movement during my exam she is more relieved and feels better.  Patient is admitted to hospitalist service for further management evaluation of sepsis, abnormal CT scan finding of gallbladder.  Patient remained afebrile, vital stable, does not  have any abdominal discomfort.  Ultrasound of the right upper quadrant revealed findings suspicion for a partially calcified gallbladder mass advised MRI to exclude underlying neoplastic process.  Patient's repeat white count improved, lactic acid within normal limits, cultures so far negative.  I discussed with patient regarding findings of ultrasound.  She does not want to get any more imaging studies as she does not want any interventions if we find malignancy.  She is very clear about her decision.  The discussion is witnessed by her neighbor/caretaker Mr. Hector.  PT OT evaluated the patient recommended home health services.  Patient is hemodynamically stable to be discharged home with home health services.  Patient is advised to follow-up with PCP upon discharge as instructed.  Advised constipation regimen.  Patient and her neighbors at bedside understand and agree with the discharge plan.       Consultants: none  Procedures performed: none  Disposition: Home health Diet recommendation:  Discharge Diet Orders (From admission, onward)     Start     Ordered   01/11/23 0000  Diet - low sodium heart healthy        01/11/23 1357           Cardiac diet DISCHARGE MEDICATION: Allergies as of 01/11/2023   No Known Allergies      Medication List     STOP taking these medications    diclofenac 75 MG EC tablet Commonly known as: VOLTAREN       TAKE these medications    aspirin 81 MG tablet Take 81 mg by mouth daily.   benzonatate 100 MG capsule Commonly known as: Lawyer Take  1 capsule (100 mg total) by mouth 3 (three) times daily as needed for cough.   bisacodyl 5 MG EC tablet Commonly known as: DULCOLAX Take 1 tablet (5 mg total) by mouth daily as needed for moderate constipation.   Calcium Carbonate 500 MG Chew Chew 500 mg by mouth daily.   cholecalciferol 25 MCG (1000 UNIT) tablet Commonly known as: VITAMIN D3 Take 1,000 Units by mouth daily.   docusate  sodium 100 MG capsule Commonly known as: COLACE Take 1 capsule (100 mg total) by mouth 2 (two) times daily.   loratadine 10 MG tablet Commonly known as: CLARITIN TAKE 1 TABLET BY MOUTH ONCE DAILY   ondansetron 4 MG disintegrating tablet Commonly known as: ZOFRAN-ODT Take 1 tablet (4 mg total) by mouth every 8 (eight) hours as needed for nausea or vomiting.   oxybutynin 5 MG tablet Commonly known as: DITROPAN TAKE 1 TABLET BY MOUTH TWICE DAILY.   pantoprazole 40 MG tablet Commonly known as: Protonix Take 1 tablet (40 mg total) by mouth daily. What changed: when to take this   senna 8.6 MG Tabs tablet Commonly known as: SENOKOT Take 1 tablet (8.6 mg total) by mouth 2 (two) times daily.   sucralfate 1 GM/10ML suspension Commonly known as: Carafate Take 10 mLs (1 g total) by mouth 4 (four) times daily -  with meals and at bedtime.   vitamin E 180 MG (400 UNITS) capsule Take 400 Units by mouth daily.        Discharge Exam: Filed Weights   01/10/23 1022 01/11/23 0342  Weight: 59.9 kg 68.6 kg   General - Elderly ill Caucasian female, no apparent distress HEENT - PERRLA, EOMI, atraumatic head, non tender sinuses. Lung - Clear, rales, rhonchi, wheezes. Heart - S1, S2 heard, no murmurs, rubs, trace pedal edema. Abdomen-soft, nondistended, nontender Neuro - Alert, awake and oriented x 3, non focal exam. Skin - Warm and dry.  Condition at discharge: stable  The results of significant diagnostics from this hospitalization (including imaging, microbiology, ancillary and laboratory) are listed below for reference.   Imaging Studies: US Abdomen Limited RUQ (LIVER/GB)  Result Date: 01/10/2023 CLINICAL DATA:  Gallbladder mass. EXAM: ULTRASOUND ABDOMEN LIMITED RIGHT UPPER QUADRANT COMPARISON:  None Available. FINDINGS: Gallbladder: A 1.3 cm x 1.0 cm x 0.9 cm partially calcified echogenic area is seen within the gallbladder lumen. Flow is not clearly identified within this region  on color Doppler evaluation. The gallbladder wall measures 1.8 mm in thickness. No sonographic Murphy sign noted by sonographer. Common bile duct: Diameter: 2.5 mm Liver: No focal lesion identified. Diffusely increased echogenicity of the liver parenchyma is noted. Portal vein is patent on color Doppler imaging with normal direction of blood flow towards the liver. Other: Limited study secondary to patient motion, as per the ultrasound technologist. IMPRESSION: 1. Findings suspicious for a partially calcified gallbladder mass. MRI correlation is recommended to further exclude the presence of an underlying neoplastic process. 2. Hepatic steatosis without focal liver lesions. Electronically Signed   By: Aram Candela M.D.   On: 01/10/2023 19:09   DG Chest Portable 1 View  Result Date: 01/10/2023 CLINICAL DATA:  weakness EXAM: PORTABLE CHEST 1 VIEW COMPARISON:  None Available. FINDINGS: Low lung volume. There are probable atelectatic changes at the left lung base with associated trace left pleural effusion. There is apparent blunting of right lateral costophrenic angle, which may be due to overlying soft tissue. Normal cardio-mediastinal silhouette. No acute osseous abnormalities. Old nonunited fracture of lateral  left clavicle noted. Note is made of bilateral high riding humeri in relation to glenoid, concerning for complete rotator cuff tear. The soft tissues are within normal limits. IMPRESSION: Probable atelectatic changes at the left lung base with associated trace left pleural effusion. Electronically Signed   By: Jules Schick M.D.   On: 01/10/2023 15:51   CT ABDOMEN PELVIS W CONTRAST  Result Date: 01/10/2023 CLINICAL DATA:  abd pain EXAM: CT ABDOMEN AND PELVIS WITH CONTRAST TECHNIQUE: Multidetector CT imaging of the abdomen and pelvis was performed using the standard protocol following bolus administration of intravenous contrast. RADIATION DOSE REDUCTION: This exam was performed according to the  departmental dose-optimization program which includes automated exposure control, adjustment of the mA and/or kV according to patient size and/or use of iterative reconstruction technique. CONTRAST:  75mL OMNIPAQUE IOHEXOL 300 MG/ML  SOLN COMPARISON:  None Available. FINDINGS: Lower chest: There are bilateral small pleural effusions. There are dependent changes in bilateral lungs. Bilateral lungs are otherwise clear. The heart is normal in size. No pericardial effusion. Hepatobiliary: The liver is normal in size. Non-cirrhotic configuration. No suspicious mass. These is mild diffuse hepatic steatosis. No intrahepatic or extrahepatic bile duct dilation. Physiologically distended gallbladder. There is a predominantly hyperattenuating approximately 2.6 x 3.1 cm rounded masslike area nearly completely filling the gallbladder lumen. There is central faint dystrophic calcification. This is incompletely characterized on the current examination and may represent tumefactive sludge; however, nonemergent right upper quadrant ultrasound is recommended to exclude mass lesion. No pericholecystic fat stranding. Pancreas: Unremarkable. No pancreatic ductal dilatation or surrounding inflammatory changes. Spleen: Within normal limits. No focal lesion. 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 Adrenals/Urinary Tract: Adrenal glands are unremarkable. No suspicious renal mass. There is a 1.4 x 1.8 cm simple cyst arising from the left kidney upper pole, laterally. No hydronephrosis. No renal or ureteric calculi. 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 Urinary bladder is collapsed and not diagnostically evaluated due to extensive streak artifacts. Stomach/Bowel: Stomach is distended with fluid and dependent food material. There is a medium sized diverticulum arising from the second part of  duodenum. No disproportionate dilation of the small or large bowel loops. No evidence of abnormal bowel wall thickening or inflammatory changes. The appendix is unremarkable. There are scattered diverticula mainly in the sigmoid colon, without imaging signs of diverticulitis. Moderate stool burden noted. 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 No abnormal wall thickening or perirectal fat stranding. Vascular/Lymphatic: No ascites or pneumoperitoneum. No abdominal or pelvic lymphadenopathy, by size criteria. No aneurysmal dilation of the major abdominal arteries. There are mild peripheral atherosclerotic vascular calcifications of the aorta and its major branches. Reproductive: Not diagnostically evaluated due to extensive streak artifacts. Other: The visualized soft tissues and abdominal wall are unremarkable. Musculoskeletal: No suspicious osseous lesions. Right total hip arthroplasty noted. There are moderate multilevel degenerative changes in the visualized spine. IMPRESSION: 1. No acute inflammatory process within the abdomen or pelvis. 2. Rectal fecal impaction. Moderate stool burden. No bowel obstruction. 3. Masslike area in the gallbladder lumen. Nonemergent right upper quadrant ultrasound is recommended for further evaluation. 4. Bilateral small pleural effusions. 5. Multiple other nonacute observations, as described above. Electronically Signed   By: Jules Schick M.D.   On: 01/10/2023 13:28   DG Abdomen 1 View  Result Date: 01/10/2023 CLINICAL DATA:  Constipation.  EXAM: ABDOMEN - 1 VIEW COMPARISON:  None Available. FINDINGS: The bowel gas pattern is normal. Large stool ball in the rectum. Mildly increased colonic stool burden elsewhere. No radio-opaque calculi or other significant radiographic abnormality are seen. No acute osseous abnormality. Prior right hip arthroplasty. IMPRESSION: 1. Large stool ball in the rectum. Mildly increased colonic stool burden.  Electronically Signed   By: Obie Dredge M.D.   On: 01/10/2023 12:05    Microbiology: Results for orders placed or performed during the hospital encounter of 01/10/23  Blood culture (routine x 2)     Status: None (Preliminary result)   Collection Time: 01/10/23 11:42 AM   Specimen: BLOOD RIGHT ARM  Result Value Ref Range Status   Specimen Description BLOOD RIGHT ARM  Final   Special Requests   Final    BOTTLES DRAWN AEROBIC AND ANAEROBIC Blood Culture adequate volume   Culture   Final    NO GROWTH < 24 HOURS Performed at [facility], [address] [place], [place] [address]    Report Status PENDING  Incomplete  Blood culture (routine x 2)     Status: None (Preliminary result)   Collection Time: 01/10/23 11:42 AM   Specimen: BLOOD  Result Value Ref Range Status   Specimen Description BLOOD RIGHT [facility]  Final   Special Requests   Final    BOTTLES DRAWN AEROBIC AND ANAEROBIC Blood Culture adequate volume   Culture   Final    NO GROWTH < 24 HOURS Performed at [facility], [address] [place], [place] [address]    Report Status PENDING  Incomplete    Labs: CBC: Recent Labs  Lab 01/10/23 1022 01/11/23 0449  WBC 20.9* 11.5*  NEUTROABS  --  8.2*  HGB 9.9* 8.0*  HCT 31.9* 25.9*  MCV 94.9 94.9  PLT 715* 515*   Basic Metabolic Panel: Recent Labs  Lab 01/10/23 1022 01/11/23 0449  NA 137 137  K 4.1 3.5  CL 108 111  CO2 21* 20*  GLUCOSE 120* 83  BUN 30* 29*  CREATININE 0.97 0.76  CALCIUM 7.6* 7.0*   Liver Function Tests: Recent Labs  Lab 01/10/23 1619 01/11/23 0449  AST 18 18  ALT 14 12  ALKPHOS 66 51  BILITOT 0.6 0.5  PROT 3.7* 3.0*  ALBUMIN 1.6* <1.5*   CBG: No results for input(s): "GLUCAP" in the last 168 hours.  Discharge time spent: greater than 30 minutes.  Signed: Marcelino Duster, MD [facility] 01/11/2023

## 2023-01-11 NOTE — Evaluation (Addendum)
Physical Therapy Evaluation Patient Details Name: Judy [name] MRN: [id number] DOB: January 02, 1926 Today's Date: 01/11/2023  History of Present Illness  Pt is a 89y/p female presenting with sepsis and initial complaints of severs constipation. PMH includes HTN, HLD, and GERD  Clinical Impression   Pt presents laying in bed with friend in the room, A&Ox4. She currently lives in a 1 story home alone with 2 stairs to enter with a grab bar on the R side. PTA, she was modi with all mobility/ADLs and utilized a RW to ambulate.   Pt able to perform supine>sit minA for trunk control and was able to initiate mobility and scoot at EOB. Pt also able to perform sit<>stand and transfer to recliner by lateral stepping with minA and RW. Pt would benefit from continued skilled therapy to maximize functional independence.       If plan is discharge home, recommend the following: Assist for transportation;Help with stairs or ramp for entrance;Assistance with cooking/housework;A little help with bathing/dressing/bathroom;A little help with walking and/or transfers   Can travel by private vehicle   Yes    Equipment Recommendations None recommended by PT  Recommendations for Other Services       Functional Status Assessment Patient has had a recent decline in their functional status and demonstrates the ability to make significant improvements in function in a reasonable and predictable amount of time.     Precautions / Restrictions Precautions Precautions: Fall Restrictions Weight Bearing Restrictions: No      Mobility  Bed Mobility Overal bed mobility: Needs Assistance Bed Mobility: Supine to Sit     Supine to sit: Min assist     General bed mobility comments: Able to initiate movement, minA for trunk control. Able to scoot towards EOB with supervision for safety    Transfers Overall transfer level: Needs assistance Equipment used: Rolling walker (2 wheels) Transfers: Sit to/from  Stand, Bed to chair/wheelchair/BSC Sit to Stand: Min assist                Ambulation/Gait                  Stairs            Wheelchair Mobility     Tilt Bed    Modified Rankin (Stroke Patients Only)       Balance Overall balance assessment: Needs assistance Sitting-balance support: No upper extremity supported, Feet unsupported, Bilateral upper extremity supported, Feet supported Sitting balance-Leahy Scale: Good Sitting balance - Comments: Able to maintain sitting balance with LAQ   Standing balance support: Reliant on assistive device for balance, During functional activity Standing balance-Leahy Scale: Fair                               Pertinent Vitals/Pain Pain Assessment Pain Assessment: No/denies pain    Home Living Family/patient expects to be discharged to:: Private residence Living Arrangements: Alone Available Help at Discharge: Friend(s);Neighbor Type of Home: House Home Access: Stairs to enter Entrance Stairs-Rails: None (Grab bar on R side) Entrance Stairs-Number of Steps: 2   Home Layout: One level Home Equipment: Cane - single Librarian, academic (2 wheels) Additional Comments: Grab bars in bathroom, did not specify where    Prior Function Prior Level of Function : Independent/Modified Independent             Mobility Comments: Utilize RW for mobility       Hand Dominance  Extremity/Trunk Assessment   Upper Extremity Assessment Upper Extremity Assessment: Generalized weakness (Able to flex UE ~90* B/L in sitting)    Lower Extremity Assessment Lower Extremity Assessment: Generalized weakness (LE AROM WFL)       Communication   Communication: No difficulties  Cognition Arousal/Alertness: Awake/alert Behavior During Therapy: WFL for tasks assessed/performed Overall Cognitive Status: Within Functional Limits for tasks assessed                                 General  Comments: A&Ox4        General Comments      Exercises General Exercises - Lower Extremity Ankle Circles/Pumps: AROM, Both, 10 reps Long Arc Quad: AROM, Both, 15 reps   Assessment/Plan    PT Assessment Patient needs continued PT services  PT Problem List Decreased strength;Decreased range of motion;Decreased activity tolerance;Decreased balance;Decreased mobility;Decreased safety awareness;Decreased knowledge of use of DME       PT Treatment Interventions DME instruction;Therapeutic exercise;Gait training;Balance training;Stair training;Functional mobility training;Therapeutic activities;Patient/family education    PT Goals (Current goals can be found in the Care Plan section)  Acute Rehab PT Goals Patient Stated Goal: return home PT Goal Formulation: With patient Time For Goal Achievement: 01/25/23 Potential to Achieve Goals: Good    Frequency Min 1X/week     Co-evaluation               AM-PAC PT "6 Clicks" Mobility  Outcome Measure Help needed turning from your back to your side while in a flat bed without using bedrails?: A Little Help needed moving from lying on your back to sitting on the side of a flat bed without using bedrails?: A Little Help needed moving to and from a bed to a chair (including a wheelchair)?: A Little Help needed standing up from a chair using your arms (e.g., wheelchair or bedside chair)?: A Little Help needed to walk in hospital room?: A Little Help needed climbing 3-5 steps with a railing? : A Lot 6 Click Score: 17    End of Session Equipment Utilized During Treatment: Gait belt Activity Tolerance: Patient tolerated treatment well Patient left: in chair;with call bell/phone within reach;with chair alarm set;with family/visitor present   PT Visit Diagnosis: Other abnormalities of gait and mobility (R26.89);Difficulty in walking, not elsewhere classified (R26.2);Muscle weakness (generalized) (M62.81)    Time: 1610-9604 PT Time  Calculation (min) (ACUTE ONLY): 15 min   Charges:   PT Evaluation $PT Eval Moderate Complexity: 1 Mod   PT General Charges $$ ACUTE PT VISIT: 1 Visit        Detrice Cales, PT, SPT 1:44 PM,01/11/23

## 2023-01-11 NOTE — Plan of Care (Signed)
  Problem: Clinical Measurements: Goal: Signs and symptoms of infection will decrease Outcome: Progressing   Problem: Education: Goal: Knowledge of General Education information will improve Description: Including pain rating scale, medication(s)/side effects and non-pharmacologic comfort measures Outcome: Progressing   Problem: Clinical Measurements: Goal: Respiratory complications will improve Outcome: Progressing   Problem: Clinical Measurements: Goal: Cardiovascular complication will be avoided Outcome: Progressing   Problem: Elimination: Goal: Will not experience complications related to urinary retention Outcome: Progressing   Problem: Pain Managment: Goal: General experience of comfort will improve Outcome: Progressing   Problem: Safety: Goal: Ability to remain free from injury will improve Outcome: Progressing

## 2023-01-11 NOTE — TOC Initial Note (Addendum)
Transition of Care [facility]) - Initial/Assessment Note    Patient Details  Name: Judy [name] MRN: [id number] Date of Birth: February 09, 1926  Transition of Care [facility]) CM/SW Contact:    Darolyn Rua, LCSW Phone Number: 01/11/2023, 9:40 AM  Clinical Narrative:                  CSW spoke with patient's friend Oswaldo Done as patient was noted to be oriented x1.   He reports that patient is very sharp at baseline and he believes she was confused yesterday due to pain however is better today. Oswaldo Done reports he is the neighbor of patient, states that he goes over everyday to help care for her. Reports patient is pretty independent at baseline, uses a walker and cane to get around. Reports Him and his wife help patient out and they have noticed in the lat 3 weeks patient has became more weak.   CSW informed him that PT is to be ordered to work with patient, informed him recs could be snf vs hh. He reports he knows patient would not be agreeable to snf as she would prefer to go home with home health services. No preference of home health, CSW sent referral to [facility].  [facility] with [facility] informed of potential dc today   TOC will follow for additional discharge planning needs.    Expected Discharge Plan: Home w Home Health Services Barriers to Discharge: Continued Medical Work up   Patient Goals and CMS Choice Patient states their goals for this hospitalization and ongoing recovery are:: to go home CMS Medicare.gov Compare Post Acute Care list provided to:: Patient Represenative (must comment) (friend Geographical information systems officer)        Expected Discharge Plan and Services                                              Prior Living Arrangements/Services                       Activities of Daily Living Home Assistive Devices/Equipment: Cane (specify quad or straight), Walker (specify type) ADL Screening (condition at time of admission) Patient's cognitive ability adequate to  safely complete daily activities?: Yes Is the patient deaf or have difficulty hearing?: No Does the patient have difficulty seeing, even when wearing glasses/contacts?: No Does the patient have difficulty concentrating, remembering, or making decisions?: No Patient able to express need for assistance with ADLs?: Yes Does the patient have difficulty dressing or bathing?: No Independently performs ADLs?: Yes (appropriate for developmental age) Does the patient have difficulty walking or climbing stairs?: Yes Weakness of Legs: Both Weakness of Arms/Hands: Both  Permission Sought/Granted                  Emotional Assessment              Admission diagnosis:  Lactic acidosis [E87.20] Fecal impaction in rectum (HCC) [K56.41] Sepsis (HCC) [A41.9] Leukocytosis, unspecified type [D72.829] Patient Active Problem List   Diagnosis Date Noted   Sepsis (HCC) 01/10/2023   Gastroesophageal reflux disease without esophagitis 01/06/2023   Presbycusis of both ears 08/02/2022   Arthritis 10/14/2021   Heart murmur 10/14/2021   Hyperlipidemia 10/14/2021   Shingles 10/14/2021   Subdural hematoma (HCC) 11/12/2019   Hypertension 08/09/2019   Stage 3a chronic kidney disease (HCC) 08/09/2019   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 [place] [address] Phone: [phone] Fax: [phone]     Social Determinants of Health (SDOH) Social History: SDOH Screenings   Food Insecurity: No Food Insecurity (01/10/2023)  Housing: Low Risk  (01/10/2023)  Transportation Needs: No Transportation Needs (01/10/2023)  Utilities: Not At Risk (01/10/2023)  Social Connections: Unknown (10/27/2021)   Received from [facility]  Tobacco Use: Low Risk  (01/10/2023)   SDOH Interventions:     Readmission Risk Interventions     No data to display

## 2023-01-11 NOTE — Care Management CC44 (Signed)
Condition Code 44 Documentation Completed  Patient Details  Name: Judy [name] MRN: [id number] Date of Birth: 1925/07/17   Condition Code 44 given:  Yes Patient signature on Condition Code 44 notice:  Yes Documentation of 2 MD's agreement:  Yes Code 44 added to claim:  Yes    Darolyn Rua, LCSW 01/11/2023, 1:47 PM

## 2023-01-12 ENCOUNTER — Ambulatory Visit: Payer: Medicare Other | Admitting: Podiatry

## 2023-01-12 ENCOUNTER — Telehealth: Payer: Self-pay | Admitting: Nurse Practitioner

## 2023-01-12 NOTE — Telephone Encounter (Signed)
Adelina Mings from [facility] called to confirm that Chelsa will sign off on PT [facility] orders for this patient. Verbal was given that Chelsa would.

## 2023-01-14 ENCOUNTER — Telehealth: Payer: Self-pay

## 2023-01-14 NOTE — Telephone Encounter (Signed)
Spoke with pt who verbalized understanding.

## 2023-01-16 ENCOUNTER — Encounter: Payer: Self-pay | Admitting: Emergency Medicine

## 2023-01-16 ENCOUNTER — Inpatient Hospital Stay
Admission: EM | Admit: 2023-01-16 | Discharge: 2023-01-25 | DRG: 871 | Disposition: A | Discharge status: Skilled Nursing Facility | Payer: Medicare Other | Attending: Internal Medicine | Admitting: Internal Medicine

## 2023-01-16 ENCOUNTER — Other Ambulatory Visit: Payer: Self-pay

## 2023-01-16 ENCOUNTER — Emergency Department: Payer: Medicare Other

## 2023-01-16 DIAGNOSIS — J9811 Atelectasis: Secondary | ICD-10-CM | POA: Diagnosis present

## 2023-01-16 DIAGNOSIS — E440 Moderate protein-calorie malnutrition: Secondary | ICD-10-CM | POA: Diagnosis present

## 2023-01-16 DIAGNOSIS — E8770 Fluid overload, unspecified: Secondary | ICD-10-CM

## 2023-01-16 DIAGNOSIS — Z66 Do not resuscitate: Secondary | ICD-10-CM | POA: Diagnosis present

## 2023-01-16 DIAGNOSIS — I5021 Acute systolic (congestive) heart failure: Secondary | ICD-10-CM | POA: Diagnosis not present

## 2023-01-16 DIAGNOSIS — K2970 Gastritis, unspecified, without bleeding: Secondary | ICD-10-CM | POA: Diagnosis present

## 2023-01-16 DIAGNOSIS — N179 Acute kidney failure, unspecified: Secondary | ICD-10-CM | POA: Diagnosis present

## 2023-01-16 DIAGNOSIS — K219 Gastro-esophageal reflux disease without esophagitis: Secondary | ICD-10-CM | POA: Diagnosis present

## 2023-01-16 DIAGNOSIS — J189 Pneumonia, unspecified organism: Secondary | ICD-10-CM | POA: Diagnosis not present

## 2023-01-16 DIAGNOSIS — E8809 Other disorders of plasma-protein metabolism, not elsewhere classified: Secondary | ICD-10-CM | POA: Diagnosis present

## 2023-01-16 DIAGNOSIS — Z79899 Other long term (current) drug therapy: Secondary | ICD-10-CM | POA: Diagnosis not present

## 2023-01-16 DIAGNOSIS — A419 Sepsis, unspecified organism: Secondary | ICD-10-CM | POA: Diagnosis present

## 2023-01-16 DIAGNOSIS — J9601 Acute respiratory failure with hypoxia: Secondary | ICD-10-CM | POA: Diagnosis present

## 2023-01-16 DIAGNOSIS — I130 Hypertensive heart and chronic kidney disease with heart failure and stage 1 through stage 4 chronic kidney disease, or unspecified chronic kidney disease: Secondary | ICD-10-CM | POA: Diagnosis present

## 2023-01-16 DIAGNOSIS — R32 Unspecified urinary incontinence: Secondary | ICD-10-CM | POA: Diagnosis present

## 2023-01-16 DIAGNOSIS — T502X5A Adverse effect of carbonic-anhydrase inhibitors, benzothiadiazides and other diuretics, initial encounter: Secondary | ICD-10-CM | POA: Diagnosis not present

## 2023-01-16 DIAGNOSIS — E876 Hypokalemia: Secondary | ICD-10-CM | POA: Diagnosis not present

## 2023-01-16 DIAGNOSIS — E669 Obesity, unspecified: Secondary | ICD-10-CM | POA: Diagnosis present

## 2023-01-16 DIAGNOSIS — D509 Iron deficiency anemia, unspecified: Secondary | ICD-10-CM | POA: Diagnosis present

## 2023-01-16 DIAGNOSIS — I700 Atherosclerosis of aorta: Secondary | ICD-10-CM | POA: Diagnosis present

## 2023-01-16 DIAGNOSIS — D75839 Thrombocytosis, unspecified: Secondary | ICD-10-CM | POA: Diagnosis not present

## 2023-01-16 DIAGNOSIS — K5900 Constipation, unspecified: Secondary | ICD-10-CM | POA: Diagnosis present

## 2023-01-16 DIAGNOSIS — Z7982 Long term (current) use of aspirin: Secondary | ICD-10-CM

## 2023-01-16 DIAGNOSIS — Z961 Presence of intraocular lens: Secondary | ICD-10-CM | POA: Diagnosis present

## 2023-01-16 DIAGNOSIS — N3289 Other specified disorders of bladder: Secondary | ICD-10-CM | POA: Diagnosis present

## 2023-01-16 DIAGNOSIS — N1831 Chronic kidney disease, stage 3a: Secondary | ICD-10-CM | POA: Diagnosis present

## 2023-01-16 DIAGNOSIS — E7800 Pure hypercholesterolemia, unspecified: Secondary | ICD-10-CM | POA: Diagnosis present

## 2023-01-16 DIAGNOSIS — Z96641 Presence of right artificial hip joint: Secondary | ICD-10-CM | POA: Diagnosis present

## 2023-01-16 DIAGNOSIS — I5033 Acute on chronic diastolic (congestive) heart failure: Secondary | ICD-10-CM | POA: Diagnosis present

## 2023-01-16 DIAGNOSIS — D72829 Elevated white blood cell count, unspecified: Secondary | ICD-10-CM | POA: Diagnosis present

## 2023-01-16 DIAGNOSIS — Z6825 Body mass index (BMI) 25.0-25.9, adult: Secondary | ICD-10-CM

## 2023-01-16 DIAGNOSIS — R601 Generalized edema: Principal | ICD-10-CM

## 2023-01-16 DIAGNOSIS — I10 Essential (primary) hypertension: Secondary | ICD-10-CM | POA: Diagnosis present

## 2023-01-16 LAB — CBC WITH DIFFERENTIAL/PLATELET
Abs Immature Granulocytes: 0.33 10*3/uL — ABNORMAL HIGH (ref 0.00–0.07)
Basophils Absolute: 0.1 10*3/uL (ref 0.0–0.1)
Basophils Relative: 1 %
Eosinophils Absolute: 0.1 10*3/uL (ref 0.0–0.5)
Eosinophils Relative: 0 %
HCT: 27.9 % — ABNORMAL LOW (ref 36.0–46.0)
Hemoglobin: 9 g/dL — ABNORMAL LOW (ref 12.0–15.0)
Immature Granulocytes: 1 %
Lymphocytes Relative: 4 %
Lymphs Abs: 1.2 10*3/uL (ref 0.7–4.0)
MCH: 28.8 pg (ref 26.0–34.0)
MCHC: 32.3 g/dL (ref 30.0–36.0)
MCV: 89.4 fL (ref 80.0–100.0)
Monocytes Absolute: 2.5 10*3/uL — ABNORMAL HIGH (ref 0.1–1.0)
Monocytes Relative: 9 %
Neutro Abs: 22.4 10*3/uL — ABNORMAL HIGH (ref 1.7–7.7)
Neutrophils Relative %: 85 %
Platelets: 972 10*3/uL (ref 150–400)
RBC: 3.12 MIL/uL — ABNORMAL LOW (ref 3.87–5.11)
RDW: 16.6 % — ABNORMAL HIGH (ref 11.5–15.5)
Smear Review: INCREASED
WBC Morphology: INCREASED
WBC: 26.6 10*3/uL — ABNORMAL HIGH (ref 4.0–10.5)
nRBC: 0.5 % — ABNORMAL HIGH (ref 0.0–0.2)

## 2023-01-16 LAB — BRAIN NATRIURETIC PEPTIDE: B Natriuretic Peptide: 57 pg/mL (ref 0.0–100.0)

## 2023-01-16 LAB — COMPREHENSIVE METABOLIC PANEL
ALT: 17 U/L (ref 0–44)
AST: 20 U/L (ref 15–41)
Albumin: <1.5 g/dL — ABNORMAL LOW (ref 3.5–5.0)
Alkaline Phosphatase: 62 U/L (ref 38–126)
Anion gap: 10 (ref 5–15)
BUN: 39 mg/dL — ABNORMAL HIGH (ref 8–23)
CO2: 18 mmol/L — ABNORMAL LOW (ref 22–32)
Calcium: 7.9 mg/dL — ABNORMAL LOW (ref 8.9–10.3)
Chloride: 108 mmol/L (ref 98–111)
Creatinine, Ser: 1.27 mg/dL — ABNORMAL HIGH (ref 0.44–1.00)
GFR, Estimated: 39 mL/min — ABNORMAL LOW (ref >60)
Glucose, Bld: 147 mg/dL — ABNORMAL HIGH (ref 70–99)
Potassium: 3.9 mmol/L (ref 3.5–5.1)
Sodium: 136 mmol/L (ref 135–145)
Total Bilirubin: 0.2 mg/dL — ABNORMAL LOW (ref 0.3–1.2)
Total Protein: 3.5 g/dL — ABNORMAL LOW (ref 6.5–8.1)

## 2023-01-16 LAB — LACTIC ACID, PLASMA: Lactic Acid, Venous: 1.9 mmol/L (ref 0.5–1.9)

## 2023-01-16 MED ORDER — ASPIRIN 81 MG PO TBEC
81.0000 mg | DELAYED_RELEASE_TABLET | Freq: Every day | ORAL | Status: DC
  Administered 2023-01-17: 81 mg via ORAL

## 2023-01-16 MED ORDER — SODIUM CHLORIDE 0.9 % IV SOLN
500.0000 mg | Freq: Once | INTRAVENOUS | Status: AC
  Administered 2023-01-16: 500 mg via INTRAVENOUS
  Filled 2023-01-16: qty 5

## 2023-01-16 MED ORDER — SODIUM CHLORIDE 0.9 % IV SOLN
500.0000 mg | INTRAVENOUS | Status: DC
  Administered 2023-01-17 – 2023-01-18 (×2): 500 mg via INTRAVENOUS
  Filled 2023-01-16 (×3): qty 5

## 2023-01-16 MED ORDER — ONDANSETRON HCL 4 MG/2ML IJ SOLN
4.0000 mg | Freq: Four times a day (QID) | INTRAMUSCULAR | Status: DC | PRN
  Administered 2023-01-16 – 2023-01-22 (×3): 4 mg via INTRAVENOUS
  Filled 2023-01-16 (×3): qty 2

## 2023-01-16 MED ORDER — SODIUM CHLORIDE 0.9 % IV SOLN
2.0000 g | INTRAVENOUS | Status: DC
  Administered 2023-01-16: 2 g via INTRAVENOUS
  Filled 2023-01-16: qty 20

## 2023-01-16 MED ORDER — ENOXAPARIN SODIUM 30 MG/0.3ML IJ SOSY
30.0000 mg | PREFILLED_SYRINGE | INTRAMUSCULAR | Status: DC
  Administered 2023-01-16 – 2023-01-17 (×2): 30 mg via SUBCUTANEOUS
  Filled 2023-01-16 (×2): qty 0.3

## 2023-01-16 MED ORDER — ONDANSETRON HCL 4 MG/2ML IJ SOLN
4.0000 mg | Freq: Once | INTRAMUSCULAR | Status: AC
  Administered 2023-01-16: 4 mg via INTRAVENOUS
  Filled 2023-01-16: qty 2

## 2023-01-16 MED ORDER — SODIUM CHLORIDE 0.9 % IV BOLUS: 1000.0000 mL | Freq: Once | INTRAVENOUS | Status: DC

## 2023-01-16 MED ORDER — SUCRALFATE 1 GM/10ML PO SUSP
1.0000 g | Freq: Three times a day (TID) | ORAL | Status: DC
  Administered 2023-01-16 – 2023-01-24 (×32): 1 g via ORAL
  Filled 2023-01-16 (×33): qty 10

## 2023-01-16 MED ORDER — FUROSEMIDE 10 MG/ML IJ SOLN
20.0000 mg | Freq: Once | INTRAMUSCULAR | Status: AC
  Administered 2023-01-16: 20 mg via INTRAVENOUS
  Filled 2023-01-16: qty 4

## 2023-01-16 MED ORDER — PANTOPRAZOLE SODIUM 40 MG PO TBEC
40.0000 mg | DELAYED_RELEASE_TABLET | Freq: Every day | ORAL | Status: DC
  Administered 2023-01-16 – 2023-01-25 (×10): 40 mg via ORAL
  Filled 2023-01-16 (×10): qty 1

## 2023-01-16 MED ORDER — SODIUM CHLORIDE 0.9 % IV SOLN
2.0000 g | INTRAVENOUS | Status: DC
  Administered 2023-01-17 – 2023-01-18 (×2): 2 g via INTRAVENOUS
  Filled 2023-01-16 (×3): qty 20

## 2023-01-16 MED ORDER — SENNA 8.6 MG PO TABS
1.0000 | ORAL_TABLET | Freq: Two times a day (BID) | ORAL | Status: DC
  Administered 2023-01-16 – 2023-01-25 (×17): 8.6 mg via ORAL
  Filled 2023-01-16 (×19): qty 1

## 2023-01-16 NOTE — ED Triage Notes (Signed)
Pt via [facility] from home. Pt's neighbor is POA, pt was seen here on 7/31 for constipation and elevated WBC. EMS report 105 HR, 26 RR, 24 ETCO2, 142/89 BP. Family called for loss of appetite and weakness. EMS reports unknown source of infection. Bilateral edema in pt's legs and arm but denies hx of CHF. Pt is A&OX4 and NAD, answers all questions appropriately.

## 2023-01-16 NOTE — H&P (Addendum)
History and Physical    Patient: Judy [name] EXB:[id number] DOB: Feb 10, 1926 DOA: 01/16/2023 DOS: the patient was seen and examined on 01/16/2023 PCP: Orson Eva, NP  Patient coming from: Home  Chief Complaint:  Chief Complaint  Patient presents with   Code Sepsis   HPI: Judy [name] is a 89 y.o. female with medical history significant of hypertension, hypertension, constipation presenting with acute respiratory failure with hypoxia, pneumonia, volume overload.  History primarily from patient's neighbor, Judy [name] who is healthcare power of attorney ([phone]).  Patient noted to be admitted July 29 July 30 for lactic acidosis and severe constipation.  Symptomatically improved after large bowel movement status post enema.  Per report, patient with increased work of breathing over the past 1 to 2 days.  Patient lives at home alone besides her neighbor Judy [name].  Patient's neighbor noticed patient with worsening shortness of breath on exertion as well as overall fatigue.  No reported fevers or chills.  No reported nausea or vomiting.  Judy [name] does report patient having episodes of induced vomiting because of reflux symptoms.  No abdominal pain or diarrhea.  Has symptomatically felt better after episode of constipation status post enema while hospitalized. Presented to the ER afebrile, hemodynamically stable.  Satting in mid 90s on 2 L nasal cannula.  White count 27, hemoglobin 9, platelets 972, creatinine 1.3, glucose 147.  CT chest and pelvis noted moderate bilateral pleural effusions and dependent atelectasis.  Review of Systems: As mentioned in the history of present illness. All other systems reviewed and are negative. 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   transferred from [facility] on 10/12/19, discharged from ED   Past Surgical History:  Procedure Laterality Date    CATARACT EXTRACTION W/PHACO Left 01/21/2020   Procedure: CATARACT EXTRACTION PHACO AND INTRAOCULAR LENS PLACEMENT (IOC) LEFT;  Surgeon: Nevada Crane, MD;  Location: [facility];  Service: Ophthalmology;  Laterality: Left;   CATARACT EXTRACTION W/PHACO Right 03/10/2020   Procedure: CATARACT EXTRACTION PHACO AND INTRAOCULAR LENS PLACEMENT (IOC) RIGHT 4.49  00:44.5;  Surgeon: Nevada Crane, MD;  Location: [facility];  Service: Ophthalmology;  Laterality: Right;   TOTAL HIP ARTHROPLASTY Right 2009   Social History:  reports that she has never smoked. She has never used smokeless tobacco. She reports that she does not drink alcohol and does not use drugs.  No Known Allergies  History reviewed. No pertinent family history.  Prior to Admission medications   Medication Sig Start Date End Date Taking? Authorizing Provider  aspirin 81 MG tablet Take 81 mg by mouth daily.    [provider]  benzonatate (TESSALON PERLES) 100 MG capsule Take 1 capsule (100 mg total) by mouth 3 (three) times daily as needed for cough. 08/02/22 08/02/23  Orson Eva, NP  bisacodyl (DULCOLAX) 5 MG EC tablet Take 1 tablet (5 mg total) by mouth daily as needed for moderate constipation. 01/11/23   Marcelino Duster, MD  Calcium Carbonate 500 MG CHEW Chew 500 mg by mouth daily.    [provider]  cholecalciferol (VITAMIN D3) 25 MCG (1000 UNIT) tablet Take 1,000 Units by mouth daily.    [provider]  docusate sodium (COLACE) 100 MG capsule Take 1 capsule (100 mg total) by mouth 2 (two) times daily. 01/11/23   Marcelino Duster, MD  loratadine (CLARITIN) 10 MG tablet TAKE 1  TABLET BY MOUTH ONCE DAILY 11/11/22   Orson Eva, NP  ondansetron (ZOFRAN-ODT) 4 MG disintegrating tablet Take 1 tablet (4 mg total) by mouth every 8 (eight) hours as needed for nausea or vomiting. 01/04/23   Minna Antis, MD  oxybutynin (DITROPAN) 5 MG tablet TAKE 1 TABLET BY MOUTH TWICE  DAILY. 09/20/22   Orson Eva, NP  pantoprazole (PROTONIX) 40 MG tablet Take 1 tablet (40 mg total) by mouth daily. Patient taking differently: Take 40 mg by mouth 2 (two) times daily. 01/04/23 01/04/24  Minna Antis, MD  senna (SENOKOT) 8.6 MG TABS tablet Take 1 tablet (8.6 mg total) by mouth 2 (two) times daily. 01/11/23   Marcelino Duster, MD  sucralfate (CARAFATE) 1 GM/10ML suspension Take 10 mLs (1 g total) by mouth 4 (four) times daily -  with meals and at bedtime. 01/06/23   Scoggins, Amber, NP  vitamin E (VITAMIN E) 180 MG (400 UNITS) capsule Take 400 Units by mouth daily.    [provider]    Physical Exam: Vitals:   01/16/23 1200 01/16/23 1230 01/16/23 1300 01/16/23 1330  BP: 124/80 (!) 165/77 (!) 164/79 (!) 167/92  Pulse: (!) 42 (!) 116 64 71  Resp: (!) 22 19 19 18   Temp:      TempSrc:      SpO2:       Physical Exam Constitutional:      Appearance: She is obese.  HENT:     Head: Normocephalic and atraumatic.     Nose: Nose normal.     Mouth/Throat:     Mouth: Mucous membranes are moist.  Eyes:     Pupils: Pupils are equal, round, and reactive to light.  Cardiovascular:     Rate and Rhythm: Normal rate and regular rhythm.  Pulmonary:     Effort: Pulmonary effort is normal.  Abdominal:     General: Bowel sounds are normal.  Musculoskeletal:     Comments: Positive generalized anasarca Positive generalized weakness  Neurological:     General: No focal deficit present.  Psychiatric:        Mood and Affect: Mood normal.     Data Reviewed:  There are no new results to review at this time. CT CHEST ABDOMEN PELVIS WO CONTRAST CLINICAL DATA:  Sepsis.  EXAM: CT CHEST, ABDOMEN AND PELVIS WITHOUT CONTRAST  TECHNIQUE: Multidetector CT imaging of the chest, abdomen and pelvis was performed following the standard protocol without IV contrast.  RADIATION DOSE REDUCTION: This exam was performed according to the departmental dose-optimization  program which includes automated exposure control, adjustment of the mA and/or kV according to patient size and/or use of iterative reconstruction technique.  COMPARISON:  Chest x-ray from same day. Right upper quadrant ultrasound and CT abdomen pelvis dated January 10, 2023.  FINDINGS: CT CHEST FINDINGS  Cardiovascular: No significant vascular findings. Normal heart size. No pericardial effusion. No thoracic aortic aneurysm. Coronary, aortic arch, and branch vessel atherosclerotic vascular disease.  Mediastinum/Nodes: No enlarged mediastinal, hilar, or axillary lymph nodes. Thyroid gland, trachea, and esophagus demonstrate no significant findings.  Lungs/Pleura: Moderate bilateral pleural effusions. Dependent atelectasis in both posterior lower lobes. No consolidation or pneumothorax.  Musculoskeletal: No acute or significant osseous findings. Nonunited left distal clavicle fracture, likely chronic. End-stage bilateral glenohumeral osteoarthritis.  CT ABDOMEN PELVIS FINDINGS  Hepatobiliary: No focal liver abnormality. Unchanged large gallstone. No gallbladder wall thickening or biliary dilatation.  Pancreas: Unremarkable. No pancreatic ductal dilatation or surrounding inflammatory changes.  Spleen: Normal in size without  focal abnormality.  Adrenals/Urinary Tract: Adrenal glands are unremarkable. Unchanged 1.4 cm left renal simple cyst. No follow-up imaging is recommended. No renal calculi or hydronephrosis. The bladder is unremarkable.  Stomach/Bowel: Stomach is within normal limits. Appendix appears normal. No evidence of bowel wall thickening, distention, or inflammatory changes. Sigmoid colonic diverticulosis.  Vascular/Lymphatic: Aortic atherosclerosis. No enlarged abdominal or pelvic lymph nodes.  Reproductive: Uterus and bilateral adnexa are unremarkable.  Other: Small amount of free fluid in the pelvis. No pneumoperitoneum.  Musculoskeletal: No acute or  significant osseous findings. Mild anasarca.  IMPRESSION: 1. Moderate bilateral pleural effusions with dependent atelectasis in both posterior lower lobes. 2. Small pelvic ascites. 3. Unchanged cholelithiasis. 4.  Aortic Atherosclerosis (ICD10-I70.0).  Electronically Signed   By: Obie Dredge M.D.   On: 01/16/2023 14:28 DG Chest Port 1 View CLINICAL DATA:  Leukocytosis.  Possible sepsis.  EXAM: PORTABLE CHEST 1 VIEW  COMPARISON:  01/10/2023  FINDINGS: Low volume film with bibasilar atelectasis and small bilateral pleural effusions. No pulmonary edema. Cardiopericardial silhouette is at upper limits of normal for size. No acute bony abnormality. Degenerative changes noted in both shoulders with chronic posttraumatic deformity left distal clavicle, similar to prior. Telemetry leads overlie the chest.  IMPRESSION: Low volume film with bibasilar atelectasis and small bilateral pleural effusions.  Electronically Signed   By: Kennith Center M.D.   On: 01/16/2023 12:11  Lab Results  Component Value Date   WBC 26.6 (H) 01/16/2023   HGB 9.0 (L) 01/16/2023   HCT 27.9 (L) 01/16/2023   MCV 89.4 01/16/2023   PLT 972 (HH) 01/16/2023   Last metabolic panel Lab Results  Component Value Date   GLUCOSE 147 (H) 01/16/2023   NA 136 01/16/2023   K 3.9 01/16/2023   CL 108 01/16/2023   CO2 18 (L) 01/16/2023   BUN 39 (H) 01/16/2023   CREATININE 1.27 (H) 01/16/2023   GFRNONAA 39 (L) 01/16/2023   CALCIUM 7.9 (L) 01/16/2023   PROT 3.5 (L) 01/16/2023   ALBUMIN <1.5 (L) 01/16/2023   LABGLOB 2.1 08/02/2022   AGRATIO 2.0 08/02/2022   BILITOT 0.2 (L) 01/16/2023   ALKPHOS 62 01/16/2023   AST 20 01/16/2023   ALT 17 01/16/2023   ANIONGAP 10 01/16/2023    Assessment and Plan: * Acute respiratory failure with hypoxia (HCC) Worsening work of breathing with new onset O2 requirement at 2 L nasal cannula to keep O2 sats greater than 95% CT imaging with noted pleural effusions and?   Pneumonia White count 27 Does appear to be fairly anasarcic on exam IV Rocephin and azithromycin for infectious pulmonary coverage Blood, sputum cultures Urine strep and Legionella Respiratory panel IV Lasix 2D echo  Monitor volume status and respiratory status closely  Leukocytosis (leucocytosis) White count 27 in setting of acute respiratory failure with concern for pneumonia IV Rocephin azithromycin for respiratory coverage Trend white count with treatment Follow  Thrombocytosis Platelet count in the 970s on presentation Suspect secondary to acute infection and Will monitor for now Heme-onc consult as clinically indicated  Gastroesophageal reflux disease without esophagitis PPI and Carafate  Stage 3a chronic kidney disease (HCC) Creatinine 1.27 with GFR in 30s Monitor renal function with diuresis Follow  Hypertension BP stable Monitor with diuresis   Greater than 50% was spent in counseling and coordination of care with patient Total encounter time 80 minutes or more    Advance Care Planning:   Code Status: DNR   Consults: None   Family Communication: Neighbor at the bedside  Severity of Illness: The appropriate patient status for this patient is INPATIENT. Inpatient status is judged to be reasonable and necessary in order to provide the required intensity of service to ensure the patient's safety. The patient's presenting symptoms, physical exam findings, and initial radiographic and laboratory data in the context of their chronic comorbidities is felt to place them at high risk for further clinical deterioration. Furthermore, it is not anticipated that the patient will be medically stable for discharge from the hospital within 2 midnights of admission.   * I certify that at the point of admission it is my clinical judgment that the patient will require inpatient hospital care spanning beyond 2 midnights from the point of admission due to high intensity of  service, high risk for further deterioration and high frequency of surveillance required.*  Author: Floydene Flock, MD 01/16/2023 3:36 PM  For on call review [email or web address].

## 2023-01-16 NOTE — Assessment & Plan Note (Signed)
Platelet count in the 970s on presentation Suspect secondary to acute infection and Will monitor for now Heme-onc consult as clinically indicated

## 2023-01-16 NOTE — Assessment & Plan Note (Signed)
PPI and Carafate

## 2023-01-16 NOTE — ED Notes (Signed)
Advised nurse that patient has ready bed 

## 2023-01-16 NOTE — ED Provider Notes (Signed)
[facility] Provider Note    Event Date/Time   First MD Initiated Contact with Patient 01/16/23 1121     (approximate)   History   Code Sepsis   HPI  Judy [name] is a 89 y.o. female who presents to the ER for evaluation of worsening shortness of breath.  Patient lives at home alone frequently checked on by neighbors who are here with her today.  They have noticed increasing shortness of breath through the past 2 days associated with lower extremity swelling and now swelling of the right upper extremity.  EMS was called.  Patient denies any pain or discomfort.  Does not feel particular short of breath but does appear dyspneic.     Physical Exam   Triage Vital Signs: ED Triage Vitals  Encounter Vitals Group     BP      Systolic BP Percentile      Diastolic BP Percentile      Pulse      Resp      Temp      Temp src      SpO2      Weight      Height      Head Circumference      Peak Flow      Pain Score      Pain Loc      Pain Education      Exclude from Growth Chart     Most recent vital signs: Vitals:   01/16/23 1300 01/16/23 1330  BP: (!) 164/79 (!) 167/92  Pulse: 64 71  Resp: 19 18  Temp:    SpO2:       Constitutional: Alert  Eyes: Conjunctivae are normal.  Head: Atraumatic. Nose: No congestion/rhinnorhea. Mouth/Throat: Mucous membranes are moist.   Neck: Painless ROM.  Cardiovascular:   Good peripheral circulation. Respiratory: Normal respiratory effort.  No retractions.  Gastrointestinal: Soft and nontender.  Musculoskeletal:  no deformity. Pitting edema of ble and rue Neurologic:  MAE spontaneously. No gross focal neurologic deficits are appreciated.  Skin:  Skin is warm, dry and intact. No rash noted. Psychiatric: Mood and affect are normal. Speech and behavior are normal.    ED Results / Procedures / Treatments   Labs (all labs ordered are listed, but only abnormal results are displayed) Labs Reviewed   COMPREHENSIVE METABOLIC PANEL - Abnormal; Notable for the following components:      Result Value   CO2 18 (*)    Glucose, Bld 147 (*)    BUN 39 (*)    Creatinine, Ser 1.27 (*)    Calcium 7.9 (*)    Total Protein 3.5 (*)    Albumin <1.5 (*)    Total Bilirubin 0.2 (*)    GFR, Estimated 39 (*)    All other components within normal limits  CBC WITH DIFFERENTIAL/PLATELET - Abnormal; Notable for the following components:   WBC 26.6 (*)    RBC 3.12 (*)    Hemoglobin 9.0 (*)    HCT 27.9 (*)    RDW 16.6 (*)    Platelets 972 (*)    nRBC 0.5 (*)    Neutro Abs 22.4 (*)    Monocytes Absolute 2.5 (*)    Abs Immature Granulocytes 0.33 (*)    All other components within normal limits  CULTURE, BLOOD (ROUTINE X 2)  CULTURE, BLOOD (ROUTINE X 2)  EXPECTORATED SPUTUM ASSESSMENT W GRAM STAIN, RFLX TO RESP C  LACTIC ACID, PLASMA  BRAIN NATRIURETIC PEPTIDE  URINALYSIS, W/ REFLEX TO CULTURE (INFECTION SUSPECTED)  LEGIONELLA PNEUMOPHILA SEROGP 1 UR AG  STREP PNEUMONIAE URINARY ANTIGEN       RADIOLOGY  Please see ED Course for my review and interpretation.  I personally reviewed all radiographic images ordered to evaluate for the above acute complaints and reviewed radiology reports and findings.  These findings were personally discussed with the patient.  Please see medical record for radiology report.  ED ECG REPORT I, Willy Eddy, the attending physician, personally viewed and interpreted this ECG.   Date: 01/16/2023  EKG Time: 11:25  Rate: 110  Rhythm: sinus  Axis: normal  Intervals: normal  ST&T Change: no stemi, no depressions    PROCEDURES:  Critical Care performed: Yes, see critical care procedure note(s)  Procedures   MEDICATIONS ORDERED IN ED: Medications  azithromycin (ZITHROMAX) 500 mg in sodium chloride 0.9 % 250 mL IVPB (has no administration in time range)  enoxaparin (LOVENOX) injection 30 mg (has no administration in time range)  cefTRIAXone  (ROCEPHIN) 2 g in sodium chloride 0.9 % 100 mL IVPB (has no administration in time range)  azithromycin (ZITHROMAX) 500 mg in sodium chloride 0.9 % 250 mL IVPB (has no administration in time range)  furosemide (LASIX) injection 20 mg (has no administration in time range)  aspirin EC tablet 81 mg (has no administration in time range)  pantoprazole (PROTONIX) EC tablet 40 mg (has no administration in time range)  sucralfate (CARAFATE) 1 GM/10ML suspension 1 g (has no administration in time range)  senna (SENOKOT) tablet 8.6 mg (has no administration in time range)     IMPRESSION / MDM / ASSESSMENT AND PLAN / ED COURSE  I reviewed the triage vital signs and the nursing notes.                              Differential diagnosis includes, but is not limited to, Dehydration, sepsis, pna, uti, pna, chf, anasarca  Patient presenting to the ER for evaluation of symptoms as described above.  Based on symptoms, risk factors and considered above differential, this presenting complaint could reflect a potentially life-threatening illness therefore the patient will be placed on continuous pulse oximetry and telemetry for monitoring.  Laboratory evaluation will be sent to evaluate for the above complaints.       Clinical Course as of 01/16/23 1535  Sun Jan 16, 2023  1358 Patient with profound hypoxia with only a few steps on room air.  Significant dyspnea on exertion.  Given leukocytosis and bandemia will order CT imaging will order Rocephin.  Will give antibiotics for possible pneumonia.  Will further evaluate for CHF.  Will consult hospitalist for admission. [PR]  1418 CT imaging, I reviewed interpretation shows evidence of diffuse anasarca pleural effusion as well. [PR]    Clinical Course User Index [PR] Willy Eddy, MD     FINAL CLINICAL IMPRESSION(S) / ED DIAGNOSES   Final diagnoses:  Anasarca  Acute respiratory failure with hypoxia (HCC)     Rx / DC Orders   ED Discharge Orders      None        Note:  This document was prepared using Dragon voice recognition software and may include unintentional dictation errors.    Willy Eddy, MD 01/16/23 1537

## 2023-01-16 NOTE — Assessment & Plan Note (Signed)
White count 27 in setting of acute respiratory failure with concern for pneumonia IV Rocephin azithromycin for respiratory coverage Trend white count with treatment Follow

## 2023-01-16 NOTE — Assessment & Plan Note (Addendum)
BP stable Monitor with diuresis

## 2023-01-16 NOTE — Progress Notes (Signed)
PHARMACIST - PHYSICIAN COMMUNICATION  CONCERNING:  Enoxaparin (Lovenox) for DVT Prophylaxis    RECOMMENDATION: Patient was prescribed enoxaprin 40mg  q24 hours for VTE prophylaxis.   There were no vitals filed for this visit.  There is no height or weight on file to calculate BMI.  Estimated Creatinine Clearance: 26.3 mL/min (A) (by C-G formula based on SCr of 1.27 mg/dL (H)).   Patient is candidate for enoxaparin 30mg  every 24 hours based on CrCl <1ml/min or Weight <45kg  DESCRIPTION: Pharmacy has adjusted enoxaparin dose per [facility] policy.  Patient is now receiving enoxaparin 30 mg every 24 hours    Barrie Folk, PharmD Clinical Pharmacist  01/16/2023 3:17 PM

## 2023-01-16 NOTE — Assessment & Plan Note (Signed)
Worsening work of breathing with new onset O2 requirement at 2 L nasal cannula to keep O2 sats greater than 95% CT imaging with noted pleural effusions and?  Pneumonia White count 27 Does appear to be fairly anasarcic on exam IV Rocephin and azithromycin for infectious pulmonary coverage Blood, sputum cultures Urine strep and Legionella Respiratory panel IV Lasix 2D echo  Monitor volume status and respiratory status closely

## 2023-01-16 NOTE — ED Notes (Signed)
This tech and Heidy, NT attempted to get an in and cath urine. Unable to obtain. Kate,RN aware.

## 2023-01-16 NOTE — Assessment & Plan Note (Signed)
Creatinine 1.27 with GFR in 30s Monitor renal function with diuresis Follow

## 2023-01-16 NOTE — Progress Notes (Signed)
Patient arrived to unit in stretcher with family.  Patient transfers to hospital bed with total assist.  Patient and family oriented to room and unit with reported understanding.

## 2023-01-16 NOTE — ED Notes (Addendum)
Ambulated patient with pulse ox. Patient was able to ambulate approx. 4 feet with walker. Patient's oxygen saturations dropped to 85% and patient became tachypneic with RR of 36.

## 2023-01-17 ENCOUNTER — Inpatient Hospital Stay (HOSPITAL_COMMUNITY)
Admit: 2023-01-17 | Discharge: 2023-01-17 | Disposition: A | Discharge status: Home / Self Care | Payer: Medicare Other | Attending: Family Medicine | Admitting: Family Medicine

## 2023-01-17 DIAGNOSIS — J9601 Acute respiratory failure with hypoxia: Secondary | ICD-10-CM | POA: Diagnosis not present

## 2023-01-17 DIAGNOSIS — I5021 Acute systolic (congestive) heart failure: Secondary | ICD-10-CM | POA: Diagnosis not present

## 2023-01-17 MED ORDER — FUROSEMIDE 10 MG/ML IJ SOLN
20.0000 mg | Freq: Every day | INTRAMUSCULAR | Status: DC
  Administered 2023-01-17: 20 mg via INTRAVENOUS
  Filled 2023-01-17: qty 2

## 2023-01-17 NOTE — Progress Notes (Signed)
*  PRELIMINARY RESULTS* Echocardiogram 2D Echocardiogram has been performed.  Cristela Blue 01/17/2023, 12:50 PM

## 2023-01-17 NOTE — Evaluation (Signed)
Occupational Therapy Evaluation Patient Details Name: Judy [name] MRN: [id number] DOB: 04-14-1926 Today's Date: 01/17/2023   History of Present Illness 89 y.o. female with medical history significant of HTN, HLD, constipation, GERD, and stage 3A CKD presenting with acute respiratory failure with hypoxia, pneumonia, volume overload. Recently admitted 7/29-7/30 for lactic acidosis and severe constipation.   Clinical Impression   Pt was seen for OT evaluation this date. Two friends present who help support pt. Prior to recent admission before this admission, pt was using a rollator for short distances, mod indep with meal prep, medication mgt, and ADL. Friends would pick her up for appointments, church, and shopping. Pt required some physical assist to get up/down her 2 steps. Friends deny falls in past 58mo but pt does endorse a fall but was able to get up on her own. Pt presents to acute OT demonstrating impaired ADL performance and functional mobility 2/2 BUE and BLE edema, generalized weakness, impaired balance, activity tolerance, and cognition (See OT problem list for additional functional deficits). Pt currently requires MAX A to TOTAL A +2 for bed mobility and lateral scoot attempts. Pt unable to maintain static sitting balance. Standing deferred. Pt endorsing fatigue throughout. VSS on 1L but pt sounds a bit wheezy with bed level manual muscle testing. Pt currently requires MAX A for all LB ADL, MIN-MOD A for seated UB ADL tasks. Pt would benefit from skilled OT services to address noted impairments and functional limitations (see below for any additional details) in order to maximize safety and independence while minimizing falls risk and caregiver burden.    Recommendations for follow up therapy are one component of a multi-disciplinary discharge planning process, led by the attending physician.  Recommendations may be updated based on patient status, additional functional criteria and  insurance authorization.   Assistance Recommended at Discharge Frequent or constant Supervision/Assistance  Patient can return home with the following Two people to help with walking and/or transfers;Two people to help with bathing/dressing/bathroom;Assistance with cooking/housework;Assistance with feeding;Assist for transportation;Help with stairs or ramp for entrance;Direct supervision/assist for financial management;Direct supervision/assist for medications management    Functional Status Assessment  Patient has had a recent decline in their functional status and demonstrates the ability to make significant improvements in function in a reasonable and predictable amount of time.  Equipment Recommendations  Other (comment) (defer to next venue)    Recommendations for Other Services       Precautions / Restrictions Precautions Precautions: Fall Restrictions Weight Bearing Restrictions: No      Mobility Bed Mobility Overal bed mobility: Needs Assistance Bed Mobility: Supine to Sit, Sit to Supine     Supine to sit: Max assist, +2 for physical assistance Sit to supine: +2 for physical assistance, Total assist   General bed mobility comments: MAX VC for sequencing/bed rail use attempts    Transfers Overall transfer level: Needs assistance Equipment used: None Transfers: Bed to chair/wheelchair/BSC            Lateral/Scoot Transfers: Total assist, +2 physical assistance        Balance Overall balance assessment: Needs assistance Sitting-balance support: Bilateral upper extremity supported, Feet supported Sitting balance-Leahy Scale: Poor Sitting balance - Comments: requiring assist for maintaining static sitting balance and unable to maintain without assist                                   ADL either performed or assessed  with clinical judgement   ADL                                         General ADL Comments: Pt required MIN A  for seated UB ADL, MAX A for LB ADL, and anticipate +2 MAX A for ADL transfer attempts.     Vision         Perception     Praxis      Pertinent Vitals/Pain Pain Assessment Pain Assessment: Faces Faces Pain Scale: Hurts little more Pain Location: BLE with deep pressure Pain Descriptors / Indicators: Tender, Moaning Pain Intervention(s): Limited activity within patient's tolerance, Monitored during session, Repositioned     Hand Dominance     Extremity/Trunk Assessment Upper Extremity Assessment Upper Extremity Assessment: Generalized weakness (BUE edema noted)   Lower Extremity Assessment Lower Extremity Assessment: Generalized weakness (+1 pitting edema noted in BLE, friends present stating they look much improved since admission)       Communication Communication Communication: No difficulties   Cognition Arousal/Alertness: Awake/alert Behavior During Therapy: WFL for tasks assessed/performed Overall Cognitive Status: Impaired/Different from baseline Area of Impairment: Orientation, Problem solving, Following commands                 Orientation Level: Disoriented to, Situation, Place     Following Commands: Follows one step commands inconsistently     Problem Solving: Slow processing, Decreased initiation, Difficulty sequencing, Requires verbal cues       General Comments       Exercises     Shoulder Instructions      Home Living Family/patient expects to be discharged to:: Private residence Living Arrangements: Alone Available Help at Discharge: Friend(s);Neighbor Type of Home: House Home Access: Stairs to enter [employer] of Steps: 2 Entrance Stairs-Rails: None (Grab bar on R side) Home Layout: One level     Bathroom Shower/Tub: Producer, television/film/video: Standard     Home Equipment: Cane - single point;Rollator (4 wheels);Toilet riser   Additional Comments: Grab bars in bathroom, did not specify where       Prior Functioning/Environment Prior Level of Function : Independent/Modified Independent             Mobility Comments: Utilize RW for mobility ADLs Comments: prior to recent admission was mod indep with ADL including standing showers, meal prep, med mgt (weekly pill box); friends drive her to stores, church        OT Problem List: Decreased strength;Decreased cognition;Pain;Decreased activity tolerance;Decreased safety awareness;Decreased knowledge of use of DME or AE;Impaired balance (sitting and/or standing);Obesity;Increased edema      OT Treatment/Interventions: Self-care/ADL training;Therapeutic exercise;Therapeutic activities;DME and/or AE instruction;Patient/family education;Balance training;Cognitive remediation/compensation;Energy conservation    OT Goals(Current goals can be found in the care plan section) Acute Rehab OT Goals Patient Stated Goal: get better OT Goal Formulation: With patient/family Time For Goal Achievement: 01/31/23 Potential to Achieve Goals: Good ADL Goals Pt Will Perform Upper Body Dressing: sitting;with supervision;with set-up Pt Will Perform Lower Body Dressing: sit to/from stand;with mod assist Pt Will Transfer to Toilet: bedside commode;stand pivot transfer;with mod assist;with +2 assist Pt Will Perform Toileting - Clothing Manipulation and hygiene: sitting/lateral leans;with min assist  OT Frequency: Min 1X/week    Co-evaluation PT/OT/SLP Co-Evaluation/Treatment: Yes Reason for Co-Treatment: For patient/therapist safety;To address functional/ADL transfers PT goals addressed during session: Mobility/safety with mobility;Balance OT goals addressed during session:  ADL's and self-care      AM-PAC OT "6 Clicks" Daily Activity     Outcome Measure Help from another person eating meals?: A Little Help from another person taking care of personal grooming?: A Little Help from another person toileting, which includes using toliet, bedpan, or  urinal?: Total Help from another person bathing (including washing, rinsing, drying)?: A Lot Help from another person to put on and taking off regular upper body clothing?: A Lot Help from another person to put on and taking off regular lower body clothing?: A Lot 6 Click Score: 13   End of Session Equipment Utilized During Treatment: Oxygen  Activity Tolerance: Patient limited by fatigue Patient left: in bed;with call bell/phone within reach;with bed alarm set;with family/visitor present;Other (comment) (MD present)  OT Visit Diagnosis: Other abnormalities of gait and mobility (R26.89);Muscle weakness (generalized) (M62.81);Pain Pain - Right/Left:  (both) Pain - part of body: Leg                Time: 7846-9629 OT Time Calculation (min): 30 min Charges:  OT General Charges $OT Visit: 1 Visit OT Evaluation $OT Eval Moderate Complexity: 1 Mod OT Treatments $Therapeutic Activity: 8-22 mins  Arman Filter., MPH, MS, OTR/L ascom [phone] 01/17/23, 12:05 PM

## 2023-01-17 NOTE — Evaluation (Signed)
Physical Therapy Evaluation Patient Details Name: Judy [name] MRN: [id number] DOB: 1926/03/11 Today's Date: 01/17/2023  History of Present Illness  89 y.o. female with medical history significant of HTN, HLD, constipation, GERD, and stage 3A CKD presenting with acute respiratory failure with hypoxia, pneumonia, volume overload. Recently admitted 7/29-7/30 for lactic acidosis and severe constipation.  Clinical Impression  Pt is a pleasant 89 year old female who was admitted for ARF with hypoxia. Co-evaluation performed with OT. Pt performs bed mobility with max assist +2 and attempted lateral transfers at EOB. Unable to further perform transfers/ambulation at this time. Pt demonstrates deficits with strength/mobility/cognition. Would benefit from skilled PT to address above deficits and promote optimal return to PLOF. Pt will continue to receive skilled PT services while admitted and will defer to TOC/care team for updates regarding disposition planning.       If plan is discharge home, recommend the following: Two people to help with walking and/or transfers;Two people to help with bathing/dressing/bathroom;Help with stairs or ramp for entrance;Assist for transportation   Can travel by private vehicle   No    Equipment Recommendations  (TBD)  Recommendations for Other Services       Functional Status Assessment Patient has had a recent decline in their functional status and demonstrates the ability to make significant improvements in function in a reasonable and predictable amount of time.     Precautions / Restrictions Precautions Precautions: Fall Restrictions Weight Bearing Restrictions: No      Mobility  Bed Mobility Overal bed mobility: Needs Assistance Bed Mobility: Supine to Sit, Sit to Supine     Supine to sit: Max assist, +2 for physical assistance Sit to supine: +2 for physical assistance, Total assist   General bed mobility comments: cues for sequencing and  initiation. Once seated at EOB, unable to maintain balance without constant min/mod assist    Transfers Overall transfer level: Needs assistance Equipment used: None              Lateral/Scoot Transfers: Total assist, +2 physical assistance General transfer comment: unable to perform without total assist.    Ambulation/Gait               General Gait Details: unable  Stairs            Wheelchair Mobility     Tilt Bed    Modified Rankin (Stroke Patients Only)       Balance Overall balance assessment: Needs assistance Sitting-balance support: Bilateral upper extremity supported, Feet supported Sitting balance-Leahy Scale: Poor Sitting balance - Comments: requiring assist for maintaining static sitting balance and unable to maintain without assist                                     Pertinent Vitals/Pain Pain Assessment Pain Assessment: Faces Faces Pain Scale: Hurts little more Pain Location: BLE with deep pressure Pain Descriptors / Indicators: Tender, Moaning Pain Intervention(s): Limited activity within patient's tolerance, Repositioned    Home Living Family/patient expects to be discharged to:: Private residence Living Arrangements: Alone Available Help at Discharge: Friend(s);Neighbor Type of Home: House Home Access: Stairs to enter Entrance Stairs-Rails: None (grab bar on R side) Entrance Stairs-Number of Steps: 2   Home Layout: One level Home Equipment: Cane - single point;Rollator (4 wheels);Toilet riser (lift chair) Additional Comments: Grab bars in bathroom, did not specify where    Prior Function Prior Level of  Function : Independent/Modified Independent             Mobility Comments: uses rollator for all mobility. Reports vague falls history. Appears as poor historian ADLs Comments: prior to recent admission was mod indep with ADL including standing showers, meal prep, med mgt (weekly pill box); friends drive her  to stores, church     Hand Dominance        Extremity/Trunk Assessment   Upper Extremity Assessment Upper Extremity Assessment: Generalized weakness (B UE grossly 3/5)    Lower Extremity Assessment Lower Extremity Assessment: Generalized weakness (B LE grossly 3/5)       Communication   Communication: No difficulties  Cognition Arousal/Alertness: Awake/alert Behavior During Therapy: WFL for tasks assessed/performed Overall Cognitive Status: Impaired/Different from baseline                                 General Comments: minimal verbal response and delayed processing        General Comments      Exercises     Assessment/Plan    PT Assessment Patient needs continued PT services  PT Problem List Decreased strength;Decreased range of motion;Decreased activity tolerance;Decreased balance;Decreased mobility;Decreased safety awareness;Decreased knowledge of use of DME       PT Treatment Interventions DME instruction;Therapeutic exercise;Gait training;Balance training;Stair training;Functional mobility training;Therapeutic activities;Patient/family education    PT Goals (Current goals can be found in the Care Plan section)  Acute Rehab PT Goals Patient Stated Goal: unable to state PT Goal Formulation: Patient unable to participate in goal setting Time For Goal Achievement: 01/31/23 Potential to Achieve Goals: Fair    Frequency Min 1X/week     Co-evaluation PT/OT/SLP Co-Evaluation/Treatment: Yes Reason for Co-Treatment: For patient/therapist safety;To address functional/ADL transfers PT goals addressed during session: Mobility/safety with mobility;Balance OT goals addressed during session: ADL's and self-care       AM-PAC PT "6 Clicks" Mobility  Outcome Measure Help needed turning from your back to your side while in a flat bed without using bedrails?: A Lot Help needed moving from lying on your back to sitting on the side of a flat bed without  using bedrails?: Total Help needed moving to and from a bed to a chair (including a wheelchair)?: Total Help needed standing up from a chair using your arms (e.g., wheelchair or bedside chair)?: Total Help needed to walk in hospital room?: Total Help needed climbing 3-5 steps with a railing? : Total 6 Click Score: 7    End of Session   Activity Tolerance: Patient tolerated treatment well Patient left: in bed;with bed alarm set Nurse Communication: Mobility status PT Visit Diagnosis: Other abnormalities of gait and mobility (R26.89);Difficulty in walking, not elsewhere classified (R26.2);Muscle weakness (generalized) (M62.81)    Time: 7829-5621 PT Time Calculation (min) (ACUTE ONLY): 15 min   Charges:   PT Evaluation $PT Eval Moderate Complexity: 1 Mod   PT General Charges $$ ACUTE PT VISIT: 1 Visit         Valyn Palau, PT, DPT, GCS [phone]   , 01/17/2023, 1:24 PM

## 2023-01-17 NOTE — Progress Notes (Signed)
Progress Note   Patient: Judy [name]:[id number] DOB: [birth year] DOA: 01/16/2023     1 DOS: the patient was seen and examined on 01/17/2023   Brief hospital course:  Judy [name] is a 89 y.o. female with medical history significant of hypertension, hypertension, constipation presenting with acute respiratory failure with hypoxia, pneumonia, volume overload.  History primarily from patient's neighbor, Judy [name] who is healthcare power of attorney ([id number]).  Patient noted to be admitted July 29 July 30 for lactic acidosis and severe constipation.  Symptomatically improved after large bowel movement status post enema.  Per report, patient with increased work of breathing over the past 1 to 2 days.  Patient lives at home alone besides her neighbor Judy [name].  Patient's neighbor noticed patient with worsening shortness of breath on exertion as well as overall fatigue.  No reported fevers or chills.  No reported nausea or vomiting.  Judy [name] does report patient having episodes of induced vomiting because of reflux symptoms.  No abdominal pain or diarrhea.  Has symptomatically felt better after episode of constipation status post enema while hospitalized. Presented to the ER afebrile, hemodynamically stable.  Satting in mid 90s on 2 L nasal cannula.  White count 27, hemoglobin 9, platelets 972, creatinine 1.3, glucose 147.  CT chest and pelvis noted moderate bilateral pleural effusions and dependent atelectasis.    Assessment and Plan:  * Acute respiratory failure with hypoxia [facility]) Patient presented to the ER for evaluation of worsening shortness of breath with a new  O2 requirement, needing 2 L nasal cannula to keep O2 sats greater than 95% CT imaging with noted pleural effusions and atelectasis. Acute respiratory failure appears to be secondary to acute CHF and possible community-acquired pneumonia. Will attempt to wean off oxygen as tolerated     Acute congestive heart  failure Unclear etiology Awaiting results of 2D echocardiogram Imaging showed moderate bilateral pleural effusions with dependent atelectasis Continue Lasix 20 mg IV daily Hold beta-blockers for now due to acute exacerbation    Community-acquired pneumonia Continue empiric antibiotic therapy with Rocephin and Zithromax     Acute kidney injury Baseline serum creatinine 0.76 and now 1.32 Monitor closely while on diuretic therapy    Thrombocytosis/leukocytosis/anemia May be secondary to acute infectious etiology Monitor closely     Gastroesophageal reflux disease without esophagitis PPI and Carafate    Stage 3a chronic kidney disease (HCC) Creatinine 1.27 with GFR in 30s Monitor renal function with diuresis Follow    Hypertension BP stable Monitor with diuresis        Subjective: Patient is seen and examined at the bedside.  No new complaints.  Patient's friends at the bedside  Physical Exam: Vitals:   01/17/23 0426 01/17/23 0500 01/17/23 0729 01/17/23 0800  BP: (!) 132/43  114/68   Pulse: 93  92   Resp: 16  16   Temp: 98.1 F (36.7 C)     TempSrc: Oral     SpO2: 100%  99% 97%  Weight:  68.9 kg     Physical Exam Vitals and nursing note reviewed.  Constitutional:      Appearance: She is obese.  HENT:     Head: Normocephalic and atraumatic.     Nose: Nose normal.     Comments: Nasal cannula    Mouth/Throat:     Mouth: Mucous membranes are moist.  Eyes:     Comments: Pale conjunctivae  Cardiovascular:     Rate and Rhythm: Normal rate and regular rhythm.  Pulmonary:     Breath sounds: Rales present.  Abdominal:     General: Bowel sounds are normal.     Palpations: Abdomen is soft.  Musculoskeletal:     Cervical back: Normal range of motion and neck supple.     Right lower leg: Edema present.     Left lower leg: Edema present.  Skin:    General: Skin is warm and dry.  Neurological:     Motor: Weakness present.  Psychiatric:        Mood  and Affect: Mood normal.        Behavior: Behavior normal.     Data Reviewed: Labs reviewed.  BUN 46, creatinine 1.32, white count 27.8, hemoglobin 7.7, platelet count 743 There are no new results to review at this time.  Family Communication: Plan of care discussed with patient's parents at the bedside.  Disposition: Status is: Inpatient Remains inpatient appropriate because: On IV diuretics for CHF  Planned Discharge Destination: Skilled nursing facility    Time spent: 35  minutes  Author: Lucile Shutters, MD 01/17/2023 11:41 AM  For on call review [email or web address].

## 2023-01-18 DIAGNOSIS — J9601 Acute respiratory failure with hypoxia: Secondary | ICD-10-CM | POA: Diagnosis not present

## 2023-01-18 LAB — IRON AND TIBC
Iron: 20 ug/dL — ABNORMAL LOW (ref 28–170)
Saturation Ratios: 16 % (ref 10.4–31.8)
TIBC: 123 ug/dL — ABNORMAL LOW (ref 250–450)
UIBC: 103 ug/dL

## 2023-01-18 LAB — TYPE AND SCREEN
ABO/RH(D): O POS
Antibody Screen: NEGATIVE
Unit division: 0

## 2023-01-18 LAB — PREPARE RBC (CROSSMATCH)

## 2023-01-18 LAB — BPAM RBC
Blood Product Expiration Date: 202408092359
ISSUE DATE / TIME: 202408061110
Unit Type and Rh: 9500

## 2023-01-18 LAB — ABO/RH: ABO/RH(D): O POS

## 2023-01-18 MED ORDER — FUROSEMIDE 10 MG/ML IJ SOLN
40.0000 mg | Freq: Every day | INTRAMUSCULAR | Status: DC
  Administered 2023-01-18 – 2023-01-22 (×5): 40 mg via INTRAVENOUS
  Filled 2023-01-18 (×5): qty 4

## 2023-01-18 MED ORDER — SODIUM CHLORIDE 0.9% IV SOLUTION: Freq: Once | INTRAVENOUS | Status: AC

## 2023-01-18 MED ORDER — POTASSIUM CHLORIDE CRYS ER 20 MEQ PO TBCR
40.0000 meq | EXTENDED_RELEASE_TABLET | Freq: Once | ORAL | Status: AC
  Administered 2023-01-18: 40 meq via ORAL
  Filled 2023-01-18: qty 2

## 2023-01-18 MED ORDER — ENSURE ENLIVE PO LIQD
237.0000 mL | Freq: Two times a day (BID) | ORAL | Status: DC
  Administered 2023-01-18 – 2023-01-19 (×3): 237 mL via ORAL

## 2023-01-18 MED ORDER — POLYETHYLENE GLYCOL 3350 17 G PO PACK
17.0000 g | PACK | Freq: Every day | ORAL | Status: DC
  Administered 2023-01-18 – 2023-01-25 (×7): 17 g via ORAL
  Filled 2023-01-18 (×8): qty 1

## 2023-01-18 NOTE — Plan of Care (Signed)
  Problem: Fluid Volume: Goal: Hemodynamic stability will improve Outcome: Progressing   Problem: Clinical Measurements: Goal: Diagnostic test results will improve Outcome: Progressing Goal: Signs and symptoms of infection will decrease Outcome: Progressing   Problem: Respiratory: Goal: Ability to maintain adequate ventilation will improve Outcome: Progressing   Problem: Education: Goal: Ability to demonstrate management of disease process will improve Outcome: Progressing Goal: Ability to verbalize understanding of medication therapies will improve Outcome: Progressing Goal: Individualized Educational Video(s) Outcome: Progressing   Problem: Activity: Goal: Capacity to carry out activities will improve Outcome: Progressing   Problem: Cardiac: Goal: Ability to achieve and maintain adequate cardiopulmonary perfusion will improve Outcome: Progressing   Problem: Activity: Goal: Ability to tolerate increased activity will improve Outcome: Progressing   Problem: Clinical Measurements: Goal: Ability to maintain a body temperature in the normal range will improve Outcome: Progressing   Problem: Respiratory: Goal: Ability to maintain adequate ventilation will improve Outcome: Progressing Goal: Ability to maintain a clear airway will improve Outcome: Progressing   Problem: Education: Goal: Knowledge of General Education information will improve Description: Including pain rating scale, medication(s)/side effects and non-pharmacologic comfort measures Outcome: Progressing   Problem: Health Behavior/Discharge Planning: Goal: Ability to manage health-related needs will improve Outcome: Progressing   Problem: Clinical Measurements: Goal: Ability to maintain clinical measurements within normal limits will improve Outcome: Progressing Goal: Will remain free from infection Outcome: Progressing Goal: Diagnostic test results will improve Outcome: Progressing Goal:  Respiratory complications will improve Outcome: Progressing Goal: Cardiovascular complication will be avoided Outcome: Progressing   Problem: Activity: Goal: Risk for activity intolerance will decrease Outcome: Progressing   Problem: Nutrition: Goal: Adequate nutrition will be maintained Outcome: Progressing   Problem: Coping: Goal: Level of anxiety will decrease Outcome: Progressing   Problem: Elimination: Goal: Will not experience complications related to bowel motility Outcome: Progressing Goal: Will not experience complications related to urinary retention Outcome: Progressing   Problem: Pain Managment: Goal: General experience of comfort will improve Outcome: Progressing   Problem: Safety: Goal: Ability to remain free from injury will improve Outcome: Progressing   Problem: Skin Integrity: Goal: Risk for impaired skin integrity will decrease Outcome: Progressing

## 2023-01-18 NOTE — Progress Notes (Signed)
Progress Note   Patient: Judy [name] WCB:[id number] DOB: [birth year] DOA: 01/16/2023     2 DOS: the patient was seen and examined on 01/18/2023   Brief hospital course: Judy [name] is a 89 y.o. female with medical history significant of hypertension, hypertension, constipation presenting with acute respiratory failure with hypoxia, pneumonia, volume overload.  History primarily from patient's neighbor, Hosie Poisson who is healthcare power of attorney ([id number]).  Patient noted to be admitted July 29 July 30 for lactic acidosis and severe constipation.  Symptomatically improved after large bowel movement status post enema.  Per report, patient with increased work of breathing over the past 1 to 2 days.  Patient lives at home alone besides her neighbor [name].  Patient's neighbor noticed patient with worsening shortness of breath on exertion as well as overall fatigue.  No reported fevers or chills.  No reported nausea or vomiting.  [name] does report patient having episodes of induced vomiting because of reflux symptoms.  No abdominal pain or diarrhea.  Has symptomatically felt better after episode of constipation status post enema while hospitalized. Presented to the ER afebrile, hemodynamically stable.  Satting in mid 90s on 2 L nasal cannula.  White count 27, hemoglobin 9, platelets 972, creatinine 1.3, glucose 147.  CT chest and pelvis noted moderate bilateral pleural effusions and dependent atelectasis.      Assessment and Plan:  * Acute respiratory failure with hypoxia (HCC) Patient presented to the ER for evaluation of worsening shortness of breath and now has a new  O2 requirement, needing 2 L nasal cannula to keep O2 sats greater than 95% CT imaging with noted moderate pleural effusions and atelectasis. Acute respiratory failure appears to be secondary to acute diastolic CHF and possible community-acquired pneumonia. Will attempt to wean off oxygen as tolerated          Acute diastolic dysfunction congestive heart failure Anasarca (secondary to hypoalbuminemia) 2D echocardiogram shows an LVEF of 55 to 60% with evidence of grade 1 diastolic dysfunction Imaging showed moderate bilateral pleural effusions with dependent atelectasis Continue Lasix 40 mg IV daily Hold beta-blockers for now due to acute exacerbation       Community-acquired pneumonia Leukocytosis shows a downward trend. 27.8 >> 16.4 Continue empiric antibiotic therapy with Rocephin and Zithromax Blood culture showed no growth in 2 days       Acute kidney injury Baseline serum creatinine 0.76 >> 1.32 >> 1.04 Monitor closely while on diuretic therapy       Thrombocytosis/leukocytosis/anemia May be secondary to acute infectious etiology Patient noted to have a drop in her H&H 7.7 >> 6.4 No obvious bleeding.  Stool for occult blood pending Hold aspirin and Lovenox Will transfuse 1 unit of packed RBC Obtain iron panel       Gastroesophageal reflux disease without esophagitis PPI and Carafate     Stage 3a chronic kidney disease (HCC) Creatinine 1.27 with GFR in 30s Monitor renal function with diuresis Follow     Hypertension BP stable Monitor with diuresis    Hypokalemia Secondary to diuretic therapy Supplement potassium         Subjective: Patient is seen and examined at the bedside.  No new complaints  Physical Exam: Vitals:   01/18/23 0550 01/18/23 0818 01/18/23 1117 01/18/23 1134  BP: (!) 128/54 115/62 118/70 115/66  Pulse: 79 80 90 90  Resp:  20 18 18   Temp: 98 F (36.7 C) 98 F (36.7 C) 97.7 F (36.5 C) 97.7 F (36.5 C)  TempSrc: Oral     SpO2: 96% 99% 94% 99%  Weight:       Vitals and nursing note reviewed.  Constitutional:      Appearance: She is obese.  HENT:     Head: Normocephalic and atraumatic.     Nose: Nose normal.     Comments: Nasal cannula    Mouth/Throat:     Mouth: Mucous membranes are moist.  Eyes:     Comments: Pale  conjunctivae  Cardiovascular:     Rate and Rhythm: Normal rate and regular rhythm.  Pulmonary:     Breath sounds: Rales present.  Abdominal:     General: Bowel sounds are normal.     Palpations: Abdomen is soft.  Musculoskeletal:     Cervical back: Normal range of motion and neck supple.     Right lower leg: Edema present.     Left lower leg: Edema present.  Skin:    General: Skin is warm and dry.  Neurological:     Motor: Weakness present.  Psychiatric:        Mood and Affect: Mood normal.        Behavior: Behavior normal.   Data Reviewed: Labs reviewed. Potassium 3.2, BUN 41, creatinine 1.07, white count 16.4, hemoglobin 6.4, platelet count 569 There are no new results to review at this time.  Family Communication: Plan of care discussed with patient's healthcare power of attorney Hosie Poisson  Disposition: Status is: Inpatient Remains inpatient appropriate because: Requires IV diuretics and blood transfusion  Planned Discharge Destination: Skilled nursing facility    Time spent: 35 minutes  Author: Lucile Shutters, MD 01/18/2023 12:42 PM  For on call review [email or web address].

## 2023-01-18 NOTE — Plan of Care (Signed)
Pt is alert and oriented, denies any c/o pain VSS. Pt received 1 unit of PRBC for low hgb 6.4 and receiving oral potassium for 3.2 potassium level. Pt has generalized edema throughout with albumin level <1.5. Friends at bedside at all times. Bed is low, locked and call light in reach for safety.  Problem: Fluid Volume: Goal: Hemodynamic stability will improve Outcome: Not Progressing   Problem: Clinical Measurements: Goal: Diagnostic test results will improve Outcome: Not Progressing Goal: Signs and symptoms of infection will decrease Outcome: Not Progressing   Problem: Respiratory: Goal: Ability to maintain adequate ventilation will improve Outcome: Not Progressing   Problem: Education: Goal: Ability to demonstrate management of disease process will improve Outcome: Not Progressing Goal: Ability to verbalize understanding of medication therapies will improve Outcome: Not Progressing Goal: Individualized Educational Video(s) Outcome: Not Progressing   Problem: Activity: Goal: Capacity to carry out activities will improve Outcome: Not Progressing   Problem: Cardiac: Goal: Ability to achieve and maintain adequate cardiopulmonary perfusion will improve Outcome: Not Progressing   Problem: Activity: Goal: Ability to tolerate increased activity will improve Outcome: Not Progressing   Problem: Clinical Measurements: Goal: Ability to maintain a body temperature in the normal range will improve Outcome: Not Progressing   Problem: Respiratory: Goal: Ability to maintain adequate ventilation will improve Outcome: Not Progressing Goal: Ability to maintain a clear airway will improve Outcome: Not Progressing   Problem: Education: Goal: Knowledge of General Education information will improve Description: Including pain rating scale, medication(s)/side effects and non-pharmacologic comfort measures Outcome: Not Progressing   Problem: Health Behavior/Discharge Planning: Goal:  Ability to manage health-related needs will improve Outcome: Not Progressing   Problem: Clinical Measurements: Goal: Ability to maintain clinical measurements within normal limits will improve Outcome: Not Progressing Goal: Will remain free from infection Outcome: Not Progressing Goal: Diagnostic test results will improve Outcome: Not Progressing Goal: Respiratory complications will improve Outcome: Not Progressing Goal: Cardiovascular complication will be avoided Outcome: Not Progressing   Problem: Activity: Goal: Risk for activity intolerance will decrease Outcome: Not Progressing   Problem: Nutrition: Goal: Adequate nutrition will be maintained Outcome: Not Progressing   Problem: Coping: Goal: Level of anxiety will decrease Outcome: Not Progressing   Problem: Elimination: Goal: Will not experience complications related to bowel motility Outcome: Not Progressing Goal: Will not experience complications related to urinary retention Outcome: Not Progressing   Problem: Pain Managment: Goal: General experience of comfort will improve Outcome: Not Progressing   Problem: Safety: Goal: Ability to remain free from injury will improve Outcome: Not Progressing   Problem: Skin Integrity: Goal: Risk for impaired skin integrity will decrease Outcome: Not Progressing

## 2023-01-18 NOTE — Progress Notes (Signed)
PT Cancellation Note  Patient Details Name: Judy [name] MRN: [id number] DOB: [birth year]   Cancelled Treatment:    Reason Eval/Treat Not Completed: Other (comment). Pt with low Hgb, pending blood transfusion this date. Will hold at this time and re-attempt when medically stable.   , 01/18/2023, 9:47 AM Leala Palau, PT, DPT, GCS [phone]

## 2023-01-18 NOTE — Progress Notes (Signed)
PHARMACY - PHYSICIAN COMMUNICATION CRITICAL VALUE ALERT - BLOOD CULTURE IDENTIFICATION (BCID)  Judy [name] is an 89 y.o. female who presented to [facility] on 01/16/2023 with a chief complaint of weakness and loss of appetite  Assessment:  8/4 blood cultures with GPC in 1 of 4 bottles, BCID did not detect any organism on the panel below.  Note Blood cultures from 7/29 were no growth  Name of physician (or Provider) Contacted: Dr Joylene Igo  Current antibiotics: Ceftriaxone and azithromycin  Changes to prescribed antibiotics recommended:  Patient is on recommended antibiotics - No changes needed - monitor on current antibiotic therapy.  Blood culture likely contaminant  Results for orders placed or performed during the hospital encounter of 01/16/23  Blood Culture ID Panel (Reflexed) (Collected: 01/16/2023  2:45 PM)  Result Value Ref Range   Enterococcus faecalis NOT DETECTED NOT DETECTED   Enterococcus Faecium NOT DETECTED NOT DETECTED   Listeria monocytogenes NOT DETECTED NOT DETECTED   Staphylococcus species NOT DETECTED NOT DETECTED   Staphylococcus aureus (BCID) NOT DETECTED NOT DETECTED   Staphylococcus epidermidis NOT DETECTED NOT DETECTED   Staphylococcus lugdunensis NOT DETECTED NOT DETECTED   Streptococcus species NOT DETECTED NOT DETECTED   Streptococcus agalactiae NOT DETECTED NOT DETECTED   Streptococcus pneumoniae NOT DETECTED NOT DETECTED   Streptococcus pyogenes NOT DETECTED NOT DETECTED   A.calcoaceticus-baumannii NOT DETECTED NOT DETECTED   Bacteroides fragilis NOT DETECTED NOT DETECTED   Enterobacterales NOT DETECTED NOT DETECTED   Enterobacter cloacae complex NOT DETECTED NOT DETECTED   Escherichia coli NOT DETECTED NOT DETECTED   Klebsiella aerogenes NOT DETECTED NOT DETECTED   Klebsiella oxytoca NOT DETECTED NOT DETECTED   Klebsiella pneumoniae NOT DETECTED NOT DETECTED   Proteus species NOT DETECTED NOT DETECTED   Salmonella species NOT DETECTED NOT DETECTED    Serratia marcescens NOT DETECTED NOT DETECTED   Haemophilus influenzae NOT DETECTED NOT DETECTED   Neisseria meningitidis NOT DETECTED NOT DETECTED   Pseudomonas aeruginosa NOT DETECTED NOT DETECTED   Stenotrophomonas maltophilia NOT DETECTED NOT DETECTED   Candida albicans NOT DETECTED NOT DETECTED   Candida auris NOT DETECTED NOT DETECTED   Candida glabrata NOT DETECTED NOT DETECTED   Candida krusei NOT DETECTED NOT DETECTED   Candida parapsilosis NOT DETECTED NOT DETECTED   Candida tropicalis NOT DETECTED NOT DETECTED   Cryptococcus neoformans/gattii NOT DETECTED NOT DETECTED    Juliette Alcide, PharmD, BCPS, BCIDP Work Cell: [phone] 01/18/2023 10:03 AM

## 2023-01-19 ENCOUNTER — Inpatient Hospital Stay: Payer: Medicare Other

## 2023-01-19 DIAGNOSIS — J9601 Acute respiratory failure with hypoxia: Secondary | ICD-10-CM | POA: Diagnosis not present

## 2023-01-19 MED ORDER — ADULT MULTIVITAMIN W/MINERALS CH
1.0000 | ORAL_TABLET | Freq: Every day | ORAL | Status: DC
  Administered 2023-01-19 – 2023-01-25 (×7): 1 via ORAL
  Filled 2023-01-19 (×7): qty 1

## 2023-01-19 MED ORDER — ENSURE ENLIVE PO LIQD
237.0000 mL | Freq: Three times a day (TID) | ORAL | Status: DC
  Administered 2023-01-19 – 2023-01-25 (×13): 237 mL via ORAL

## 2023-01-19 MED ORDER — AMOXICILLIN-POT CLAVULANATE 400-57 MG/5ML PO SUSR
875.0000 mg | Freq: Two times a day (BID) | ORAL | Status: AC
  Administered 2023-01-19 – 2023-01-22 (×8): 872 mg via ORAL
  Filled 2023-01-19 (×8): qty 10.9

## 2023-01-19 MED ORDER — IPRATROPIUM-ALBUTEROL 0.5-2.5 (3) MG/3ML IN SOLN
3.0000 mL | Freq: Four times a day (QID) | RESPIRATORY_TRACT | Status: DC | PRN
  Administered 2023-01-19: 3 mL via RESPIRATORY_TRACT
  Filled 2023-01-19: qty 3

## 2023-01-19 MED ORDER — POTASSIUM CHLORIDE CRYS ER 20 MEQ PO TBCR
40.0000 meq | EXTENDED_RELEASE_TABLET | Freq: Every day | ORAL | Status: DC
  Administered 2023-01-19 – 2023-01-25 (×7): 40 meq via ORAL
  Filled 2023-01-19 (×7): qty 2

## 2023-01-19 MED ORDER — AMOXICILLIN-POT CLAVULANATE 875-125 MG PO TABS: 1.0000 | ORAL_TABLET | Freq: Two times a day (BID) | ORAL | Status: DC

## 2023-01-19 NOTE — Progress Notes (Signed)
Occupational Therapy Treatment Patient Details Name: Judy [name] MRN: [id number] DOB: Jan 26, 1926 Today's Date: 01/19/2023   History of present illness 89 y.o. female with medical history significant of HTN, HLD, constipation, GERD, and stage 3A CKD presenting with acute respiratory failure with hypoxia, pneumonia, volume overload. Recently admitted 7/29-7/30 for lactic acidosis and severe constipation.   OT comments  Chart reviewed to date, co tx completed with PT on this date. Pt is alert, oriented to self and place, increased time and multi modal cueing required for one step direction following. Tx session targeted improving functional activity tolerance through ADL/mobility tasks to facilitate return to PLOF. Improvements noted in bed mobility, STS requiring MIN-MOD A +2 with multi modal cueing, SPT to bedside chair, then to bedside commode and back with MIN-MOD A +2 with RW, multi modal cueing. MAX A required for toileting tasks. Pt is making progress towards goals, discharge recommendation remains appropriate. OT will continue to follow acutley.       If plan is discharge home, recommend the following:  A lot of help with walking and/or transfers;A lot of help with bathing/dressing/bathroom;Assistance with cooking/housework;Direct supervision/assist for medications management;Direct supervision/assist for financial management;Help with stairs or ramp for entrance   Equipment Recommendations  Other (comment) (per next venue of care)    Recommendations for Other Services      Precautions / Restrictions Precautions Precautions: Fall Restrictions Weight Bearing Restrictions: No       Mobility Bed Mobility Overal bed mobility: Needs Assistance Bed Mobility: Supine to Sit     Supine to sit: Mod assist, Max assist, +2 for physical assistance (step by step multi modal cueing for technique, pt assists with walking BLE towards edge of bed, ultimately requires MAX A to complete bed  mobility)          Transfers Overall transfer level: Needs assistance Equipment used: Rolling walker (2 wheels) Transfers: Sit to/from Stand, Bed to chair/wheelchair/BSC Sit to Stand: Min assist, Mod assist, +2 safety/equipment, From elevated surface Stand pivot transfers: Min assist, Mod assist, +2 physical assistance (two attempts,step by step multi modal cueing)               Balance Overall balance assessment: Needs assistance Sitting-balance support: Bilateral upper extremity supported, Feet supported Sitting balance-Leahy Scale: Fair   Postural control: Posterior lean Standing balance support: Reliant on assistive device for balance, During functional activity Standing balance-Leahy Scale: Fair                             ADL either performed or assessed with clinical judgement   ADL Overall ADL's : Needs assistance/impaired                     Lower Body Dressing: Maximal assistance   Toilet Transfer: Minimal assistance;Moderate assistance;+2 for physical assistance;+2 for safety/equipment;BSC/3in1 Toilet Transfer Details (indicate cue type and reason): step by step multi modal cueing for technique Toileting- Clothing Manipulation and Hygiene: Maximal assistance;Sit to/from stand              Extremity/Trunk Assessment              Vision       Perception     Praxis      Cognition Arousal: Alert Behavior During Therapy: WFL for tasks assessed/performed Overall Cognitive Status: Impaired/Different from baseline Area of Impairment: Problem solving, Following commands  Following Commands: Follows one step commands with increased time Safety/Judgement: Decreased awareness of deficits   Problem Solving: Slow processing, Decreased initiation, Difficulty sequencing, Requires verbal cues          Exercises      Shoulder Instructions       General Comments weeping edema BUE, nurse notified  and aware    Pertinent Vitals/ Pain       Pain Assessment Pain Assessment: 0-10  Home Living                                          Prior Functioning/Environment              Frequency  Min 1X/week        Progress Toward Goals  OT Goals(current goals can now be found in the care plan section)  Progress towards OT goals: Progressing toward goals     Plan      Co-evaluation    PT/OT/SLP Co-Evaluation/Treatment: Yes Reason for Co-Treatment: For patient/therapist safety;To address functional/ADL transfers   OT goals addressed during session: ADL's and self-care      AM-PAC OT "6 Clicks" Daily Activity     Outcome Measure   Help from another person eating meals?: A Little Help from another person taking care of personal grooming?: A Little Help from another person toileting, which includes using toliet, bedpan, or urinal?: Total Help from another person bathing (including washing, rinsing, drying)?: A Lot Help from another person to put on and taking off regular upper body clothing?: A Lot Help from another person to put on and taking off regular lower body clothing?: A Lot 6 Click Score: 13    End of Session Equipment Utilized During Treatment: Gait belt;Rolling walker (2 wheels)  OT Visit Diagnosis: Other abnormalities of gait and mobility (R26.89);Muscle weakness (generalized) (M62.81)   Activity Tolerance Patient tolerated treatment well   Patient Left in chair;with call bell/phone within reach;with chair alarm set;with family/visitor present   Nurse Communication Mobility status (weeping edema)        Time: 5573-2202 OT Time Calculation (min): 32 min  Charges: OT General Charges $OT Visit: 1 Visit OT Treatments $Self Care/Home Management : 8-22 mins Oleta Mouse, OTD OTR/L  01/19/23, 10:37 AM

## 2023-01-19 NOTE — Progress Notes (Addendum)
Physical Therapy Treatment Patient Details Name: Judy [name] MRN: [id number] DOB: [birth year] Today's Date: 01/19/2023   History of Present Illness 89 y.o. female with medical history significant of HTN, HLD, constipation, GERD, and stage 3A CKD presenting with acute respiratory failure with hypoxia, pneumonia, volume overload. Recently admitted 7/29-7/30 for lactic acidosis and severe constipation.    PT Comments  Pt is progressing towards goals. Pt is received in bed, she is agreeable to PT session. Pt requires max A for bed mobility with total A for scooting EOB and mod A x2 for transfers. Cuing required throughout session for hand placements during EOB seating balance and STS transfers. Pt able to perform stand pivot transfer from bed to recliner and recliner<>BSC with RW and mod A x2 maintaining vitals WNL. Pt cont to demonstrate BLE weakness and requires facilitation of weight shifting to promote side stepping for transfers. Monitored and maintained HR and SpO2 WNL throughout session using pulse ox/dynamap. Pt cont to demonstrate BUE swelling with RUE weeping edema. Pt would benefit from cont skilled PT to address above deficits and promote optimal return to PLOF.     If plan is discharge home, recommend the following: Two people to help with walking and/or transfers;Two people to help with bathing/dressing/bathroom;Help with stairs or ramp for entrance;Assist for transportation;Assistance with cooking/housework   Can travel by private vehicle     No  Equipment Recommendations  None recommended by PT    Recommendations for Other Services       Precautions / Restrictions Precautions Precautions: Fall Restrictions Weight Bearing Restrictions: No     Mobility  Bed Mobility Overal bed mobility: Needs Assistance Bed Mobility: Supine to Sit     Supine to sit: Max assist, +2 for physical assistance     General bed mobility comments: Cuing for sequencing and initiation  required. Pt required mod-max A for bed mobility with occasional facilitation of BLE movement to EOB. Multiple cuing required throughout bed mobility. Total A for scooting EOB.    Transfers Overall transfer level: Needs assistance Equipment used: Rolling walker (2 wheels) Transfers: Sit to/from Stand, Bed to chair/wheelchair/BSC Sit to Stand: Min assist, Mod assist, +2 safety/equipment, From elevated surface Stand pivot transfers: Mod assist, +2 physical assistance         General transfer comment: total A for scooting to EOB, min-mod A for stand pivot transfers to recliner and BSC. Cuing for hand placement, forward lean, and upright posture.    Ambulation/Gait                   Stairs             Wheelchair Mobility     Tilt Bed    Modified Rankin (Stroke Patients Only)       Balance Overall balance assessment: Needs assistance Sitting-balance support: Bilateral upper extremity supported, Feet supported Sitting balance-Leahy Scale: Fair Sitting balance - Comments: Able to maintain EOB seated balance but required UE support and leaned on R elbow/forearm as a rest break once. Postural control: Posterior lean Standing balance support: Reliant on assistive device for balance, During functional activity Standing balance-Leahy Scale: Fair Standing balance comment: Forward flex posture requiring cuing to correct. Able to correct to upright posture but unable to maintain due to fatigue                            Cognition Arousal: Alert Behavior During Therapy: [facility] for tasks assessed/performed Overall  Cognitive Status: Impaired/Different from baseline Area of Impairment: Following commands, Problem solving                       Following Commands: Follows one step commands with increased time Safety/Judgement: Decreased awareness of deficits   Problem Solving: Slow processing, Decreased initiation, Difficulty sequencing, Requires verbal  cues, Requires tactile cues General Comments: Delayed processing and facilitation required for hand placements        Exercises Other Exercises Other Exercises: toileting, total A for hygiene (specifically wiping)    General Comments General comments (skin integrity, edema, etc.): RUE weeping edema noted throughout session and LUE forearm/hand discoloration secondary to "infusion" per Pt report      Pertinent Vitals/Pain Pain Assessment Pain Assessment: No/denies pain    Home Living                          Prior Function            PT Goals (current goals can now be found in the care plan section) Acute Rehab PT Goals Patient Stated Goal: unable to state PT Goal Formulation: With patient Time For Goal Achievement: 01/31/23 Potential to Achieve Goals: Fair Progress towards PT goals: Progressing toward goals    Frequency    Min 1X/week      PT Plan      Co-evaluation PT/OT/SLP Co-Evaluation/Treatment: Yes Reason for Co-Treatment: For patient/therapist safety;To address functional/ADL transfers PT goals addressed during session: Mobility/safety with mobility;Balance OT goals addressed during session: ADL's and self-care      AM-PAC PT "6 Clicks" Mobility   Outcome Measure  Help needed turning from your back to your side while in a flat bed without using bedrails?: A Lot Help needed moving from lying on your back to sitting on the side of a flat bed without using bedrails?: Total Help needed moving to and from a bed to a chair (including a wheelchair)?: A Lot Help needed standing up from a chair using your arms (e.g., wheelchair or bedside chair)?: Total Help needed to walk in hospital room?: Total Help needed climbing 3-5 steps with a railing? : Total 6 Click Score: 8    End of Session Equipment Utilized During Treatment: Gait belt Activity Tolerance: Patient tolerated treatment well Patient left: in chair;with call bell/phone within reach;with  chair alarm set;with family/visitor present;Other (comment) (BLE elevated with pillows)   PT Visit Diagnosis: Other abnormalities of gait and mobility (R26.89);Difficulty in walking, not elsewhere classified (R26.2);Muscle weakness (generalized) (M62.81)     Time: 1610-9604 PT Time Calculation (min) (ACUTE ONLY): 30 min  Charges:    $Therapeutic Activity: 8-22 mins PT General Charges $$ ACUTE PT VISIT: 1 Visit                     Elmon Else, SPT      01/19/2023, 1:41 PM

## 2023-01-19 NOTE — Plan of Care (Signed)
Problem: Fluid Volume: Goal: Hemodynamic stability will improve 01/19/2023 2259 by Smiley Houseman, LPN Outcome: Progressing 01/19/2023 2259 by Smiley Houseman, LPN Outcome: Progressing   Problem: Clinical Measurements: Goal: Diagnostic test results will improve 01/19/2023 2259 by Smiley Houseman, LPN Outcome: Progressing 01/19/2023 2259 by Smiley Houseman, LPN Outcome: Progressing Goal: Signs and symptoms of infection will decrease 01/19/2023 2259 by Smiley Houseman, LPN Outcome: Progressing 01/19/2023 2259 by Smiley Houseman, LPN Outcome: Progressing   Problem: Respiratory: Goal: Ability to maintain adequate ventilation will improve 01/19/2023 2259 by Smiley Houseman, LPN Outcome: Progressing 01/19/2023 2259 by Smiley Houseman, LPN Outcome: Progressing   Problem: Education: Goal: Ability to demonstrate management of disease process will improve 01/19/2023 2259 by Smiley Houseman, LPN Outcome: Progressing 01/19/2023 2259 by Smiley Houseman, LPN Outcome: Progressing Goal: Ability to verbalize understanding of medication therapies will improve 01/19/2023 2259 by Smiley Houseman, LPN Outcome: Progressing 01/19/2023 2259 by Smiley Houseman, LPN Outcome: Progressing Goal: Individualized Educational Video(s) 01/19/2023 2259 by Smiley Houseman, LPN Outcome: Progressing 01/19/2023 2259 by Smiley Houseman, LPN Outcome: Progressing   Problem: Activity: Goal: Capacity to carry out activities will improve 01/19/2023 2259 by Smiley Houseman, LPN Outcome: Progressing 01/19/2023 2259 by Smiley Houseman, LPN Outcome: Progressing   Problem: Cardiac: Goal: Ability to achieve and maintain adequate cardiopulmonary perfusion will improve 01/19/2023 2259 by Smiley Houseman, LPN Outcome: Progressing 01/19/2023 2259 by Smiley Houseman, LPN Outcome: Progressing   Problem: Activity: Goal: Ability to tolerate increased activity will improve 01/19/2023 2259 by Smiley Houseman, LPN Outcome: Progressing 01/19/2023 2259 by Smiley Houseman, LPN Outcome: Progressing   Problem:  Clinical Measurements: Goal: Ability to maintain a body temperature in the normal range will improve 01/19/2023 2259 by Smiley Houseman, LPN Outcome: Progressing 01/19/2023 2259 by Smiley Houseman, LPN Outcome: Progressing   Problem: Respiratory: Goal: Ability to maintain adequate ventilation will improve 01/19/2023 2259 by Smiley Houseman, LPN Outcome: Progressing 01/19/2023 2259 by Smiley Houseman, LPN Outcome: Progressing Goal: Ability to maintain a clear airway will improve 01/19/2023 2259 by Smiley Houseman, LPN Outcome: Progressing 01/19/2023 2259 by Smiley Houseman, LPN Outcome: Progressing   Problem: Education: Goal: Knowledge of General Education information will improve Description: Including pain rating scale, medication(s)/side effects and non-pharmacologic comfort measures 01/19/2023 2259 by Smiley Houseman, LPN Outcome: Progressing 01/19/2023 2259 by Smiley Houseman, LPN Outcome: Progressing   Problem: Health Behavior/Discharge Planning: Goal: Ability to manage health-related needs will improve 01/19/2023 2259 by Smiley Houseman, LPN Outcome: Progressing 01/19/2023 2259 by Smiley Houseman, LPN Outcome: Progressing   Problem: Clinical Measurements: Goal: Ability to maintain clinical measurements within normal limits will improve 01/19/2023 2259 by Smiley Houseman, LPN Outcome: Progressing 01/19/2023 2259 by Smiley Houseman, LPN Outcome: Progressing Goal: Will remain free from infection 01/19/2023 2259 by Smiley Houseman, LPN Outcome: Progressing 01/19/2023 2259 by Smiley Houseman, LPN Outcome: Progressing Goal: Diagnostic test results will improve 01/19/2023 2259 by Smiley Houseman, LPN Outcome: Progressing 01/19/2023 2259 by Smiley Houseman, LPN Outcome: Progressing Goal: Respiratory complications will improve 01/19/2023 2259 by Smiley Houseman, LPN Outcome: Progressing 01/19/2023 2259 by Smiley Houseman, LPN Outcome: Progressing Goal: Cardiovascular complication will be avoided 01/19/2023 2259 by Smiley Houseman, LPN Outcome:  Progressing 01/19/2023 2259 by Smiley Houseman, LPN Outcome: Progressing   Problem: Activity: Goal: Risk for activity intolerance will decrease 01/19/2023 2259 by Smiley Houseman,  LPN Outcome: Progressing 01/19/2023 2259 by Smiley Houseman, LPN Outcome: Progressing   Problem: Nutrition: Goal: Adequate nutrition will be maintained 01/19/2023 2259 by Smiley Houseman, LPN Outcome: Progressing 01/19/2023 2259 by Smiley Houseman, LPN Outcome: Progressing   Problem: Coping: Goal: Level of anxiety will decrease 01/19/2023 2259 by Smiley Houseman, LPN Outcome: Progressing 01/19/2023 2259 by Smiley Houseman, LPN Outcome: Progressing   Problem: Elimination: Goal: Will not experience complications related to bowel motility 01/19/2023 2259 by Smiley Houseman, LPN Outcome: Progressing 01/19/2023 2259 by Smiley Houseman, LPN Outcome: Progressing Goal: Will not experience complications related to urinary retention 01/19/2023 2259 by Smiley Houseman, LPN Outcome: Progressing 01/19/2023 2259 by Smiley Houseman, LPN Outcome: Progressing   Problem: Pain Managment: Goal: General experience of comfort will improve 01/19/2023 2259 by Smiley Houseman, LPN Outcome: Progressing 01/19/2023 2259 by Smiley Houseman, LPN Outcome: Progressing   Problem: Safety: Goal: Ability to remain free from injury will improve 01/19/2023 2259 by Smiley Houseman, LPN Outcome: Progressing 01/19/2023 2259 by Smiley Houseman, LPN Outcome: Progressing   Problem: Skin Integrity: Goal: Risk for impaired skin integrity will decrease 01/19/2023 2259 by Smiley Houseman, LPN Outcome: Progressing 01/19/2023 2259 by Smiley Houseman, LPN Outcome: Progressing

## 2023-01-19 NOTE — Care Management Important Message (Signed)
Important Message  Patient Details  Name: Judy [name] MRN: [id number] Date of Birth: January 20, 1926   Medicare Important Message Given:  N/A - LOS <3 / Initial given by admissions     Olegario Messier A  01/19/2023, 8:29 AM

## 2023-01-19 NOTE — Progress Notes (Signed)
Initial Nutrition Assessment  DOCUMENTATION CODES:   Non-severe (moderate) malnutrition in context of chronic illness  INTERVENTION:   -MVI with minerals daily -Ensure Enlive po TID, each supplement provides 350 kcal and 20 grams of protein -Magic cup TID with meals, each supplement provides 290 kcal and 9 grams of protein  -Liberalize diet to regular for widest variety of meal selections   NUTRITION DIAGNOSIS:   Moderate Malnutrition related to chronic illness (CHF) as evidenced by mild muscle depletion, moderate muscle depletion, mild fat depletion, moderate fat depletion, edema.  GOAL:   Patient will meet greater than or equal to 90% of their needs  MONITOR:   PO intake, Supplement acceptance  REASON FOR ASSESSMENT:   Consult, Malnutrition Screening Tool Assessment of nutrition requirement/status  ASSESSMENT:   Pt with medical history significant of hypertension, hypertension, constipation presenting with acute respiratory failure with hypoxia, pneumonia, volume overload.  Pt admitted with acute respiratory failure, CHF, and anasarca.   Reviewed I/O's: +920 ml x 24 hours and +658 ml since admission   Pt familiar to this RD due to recent admission last week. Pt look a lot worse (more weak and debilitated) in comparison to previous visit.   Spoke with pt and multiple friends at beside. Friends reports that pt has been very little since hospital admission. Pt's intake has improved, but still not eating much. Friend shares that pt did not eat anything yesterday due to being "out of it". Pt consumed about 25% of spaghetti noodles foe lunch and consumed about 50% of strawberry ice cream during visit. Pt also drank about 75% of Ensure supplement. Noted meal completions 30-60%.  Pt has dentures, which are at home. She usually eats with them, but denies difficulty chewing foods. Denies offer of mechanically altered diet and desires to pick and choose foods that she thinks she can  tolerate.   Reviewed wt hx; no wt loss noted over the past 6 months. Pt with severe generalized edema on exam. Suspect edema may be masking true weight loss as well as fat and muscle depletions.   Discussed importance of good meal and supplement intake to promote healing. Pt amenable to supplements.   Case discussed with MD, who requested consult. May need to consider palliative care consult for goals of care discussions.   Albumin has a half-life of 21 days and is strongly affected by stress response and inflammatory process, therefore, do not expect to see an improvement in this lab value during acute hospitalization. When a patient presents with low albumin, it is likely skewed due to the acute inflammatory response. Note that low albumin is no longer used to diagnose malnutrition; [place] uses the new malnutrition guidelines published by the American Society for Parenteral and Enteral Nutrition (A.S.P.E.N.) and the Academy of Nutrition and Dietetics (AND).     Medications reviewed and include lasix, miralax, potassium chloride, and senokot.   Labs reviewed: K: 3.4.    NUTRITION - FOCUSED PHYSICAL EXAM:  Flowsheet Row Most Recent Value  Orbital Region Severe depletion  Upper Arm Region Moderate depletion  Thoracic and Lumbar Region No depletion  Buccal Region Mild depletion  Temple Region Moderate depletion  Clavicle Bone Region Moderate depletion  Clavicle and Acromion Bone Region Moderate depletion  Scapular Bone Region Moderate depletion  Dorsal Hand Moderate depletion  Patellar Region No depletion  Anterior Thigh Region No depletion  Posterior Calf Region No depletion  Edema (RD Assessment) Severe  Hair Reviewed  Eyes Reviewed  Mouth Reviewed  Skin Reviewed  Nails Reviewed       Diet Order:   Diet Order             Diet regular Fluid consistency: Thin  Diet effective now                   EDUCATION NEEDS:   Education needs have been addressed  Skin:   Skin Assessment: Reviewed RN Assessment  Last BM:  01/12/23  Height:   Ht Readings from Last 1 Encounters:  01/10/23 5\' 7"  (1.702 m)    Weight:   Wt Readings from Last 1 Encounters:  01/19/23 73.2 kg    Ideal Body Weight:  61.4 kg  BMI:  Body mass index is 25.28 kg/m.  Estimated Nutritional Needs:   Kcal:  1650-1850  Protein:  90-105 grams  Fluid:  > 1.6 L    Levada Schilling, RD, LDN, CDCES Registered Dietitian II Certified Diabetes Care and Education Specialist Please refer to [facility] for RD and/or RD on-call/weekend/after hours pager

## 2023-01-19 NOTE — TOC Initial Note (Signed)
Transition of Care [facility]) - Progression Note    Patient Details  Name: Judy [name] MRN: [id number] Date of Birth: 08-29-1925  Transition of Care [facility]) CM/SW Contact  Garret Reddish, RN Phone Number: 01/19/2023, 3:21 PM  Clinical Narrative:   Chart reviewed.  Noted that patient was admitted with Acute Respiratory failure with hypoxia, Acute diastolic dysfunction congestive heart failure. Patient is currently receiving IV diuresis.  Patient was also being treated for PNA and Acute Kidney Injury.    I have meet with patient at bedside today.  She has two supportive friends at bedside today.  Patient reports that prior to admission she lived in her own home and her friends assisted with her care at home. Patient reported that she was able to get around with a walker.  She informed me that her friends took her to appointments and assisted with meals. Prior to admission patient reported that she was able to bath herself.   I have spoken to patient about the recommendation for SNF.  Patient would prefer to go home with support of her friends.    The friends at bedside reported that some one is with patient round the clock.  The friends have given me Hosie Poisson number.  Mrs. Jayme Cloud is Mrs. Stouffer HCPOA.  Mrs. Albano was agreeable with me speaking with [name].  1540:  I have spoken with [name].  Her contact number is [phone].  [name] reports that prior to admission she and her husband Oswaldo Done assist with Mrs. Finlay's care.  She informed me that she and her husband are Mrs. Dawkins's POA.  She reports that prior to admission she and her husband would assist with meals, bathing and dressing if needed, getting patient settled for bed.  She reported that patient prior to admission patient was stand by assist and was able to assist with ambulation with a walker.  [name] reports that if patient needed anything at night she would call and she and her husband would go and assist with her needs.     I have explained to [name] the recommendation for SNF.  I have informed [name] that patient currently is requiring moderate assistance plus 2 person to assist with bed mobility, and sit to stand.  I have informed [name] that patient would need a significant amount of assistance if she would return home.  [name] feels like Mrs. Bordeau would want to return home with support of Home Health and private duty care.  [name] will speak with Mrs. Ovitt about SNF and home with 24 hour care at home.  [name] reports that her aunt and uncle assist with her needs at home also.    TOC will continue to follow for discharge planning.      Expected Discharge Plan:  (Pending Medical work up) Barriers to Discharge: Continued Medical Work up  Expected Discharge Plan and Services   Discharge Planning Services: CM Consult Post Acute Care Choice:  (Home with Home health v/s SNF) Living arrangements for the past 2 months: Single Family Home                                       Social Determinants of Health (SDOH) Interventions SDOH Screenings   Food Insecurity: No Food Insecurity (01/10/2023)  Housing: Low Risk  (01/10/2023)  Transportation Needs: No Transportation Needs (01/10/2023)  Utilities: Not At Risk (01/10/2023)  Social Connections:  Unknown (10/27/2021)   [facility] from [facility], [facility]  Tobacco Use: Low Risk  (01/16/2023)    Readmission Risk Interventions     No data to display

## 2023-01-19 NOTE — Plan of Care (Signed)
  Problem: Fluid Volume: Goal: Hemodynamic stability will improve Outcome: Progressing   Problem: Clinical Measurements: Goal: Diagnostic test results will improve Outcome: Progressing Goal: Signs and symptoms of infection will decrease Outcome: Progressing   Problem: Respiratory: Goal: Ability to maintain adequate ventilation will improve Outcome: Progressing   Problem: Education: Goal: Ability to demonstrate management of disease process will improve Outcome: Progressing Goal: Ability to verbalize understanding of medication therapies will improve Outcome: Progressing Goal: Individualized Educational Video(s) Outcome: Progressing   Problem: Activity: Goal: Capacity to carry out activities will improve Outcome: Progressing   Problem: Cardiac: Goal: Ability to achieve and maintain adequate cardiopulmonary perfusion will improve Outcome: Progressing   Problem: Activity: Goal: Ability to tolerate increased activity will improve Outcome: Progressing   Problem: Clinical Measurements: Goal: Ability to maintain a body temperature in the normal range will improve Outcome: Progressing   Problem: Respiratory: Goal: Ability to maintain adequate ventilation will improve Outcome: Progressing Goal: Ability to maintain a clear airway will improve Outcome: Progressing   Problem: Education: Goal: Knowledge of General Education information will improve Description: Including pain rating scale, medication(s)/side effects and non-pharmacologic comfort measures Outcome: Progressing   Problem: Health Behavior/Discharge Planning: Goal: Ability to manage health-related needs will improve Outcome: Progressing   Problem: Clinical Measurements: Goal: Ability to maintain clinical measurements within normal limits will improve Outcome: Progressing Goal: Will remain free from infection Outcome: Progressing Goal: Diagnostic test results will improve Outcome: Progressing Goal:  Respiratory complications will improve Outcome: Progressing Goal: Cardiovascular complication will be avoided Outcome: Progressing   Problem: Activity: Goal: Risk for activity intolerance will decrease Outcome: Progressing   Problem: Nutrition: Goal: Adequate nutrition will be maintained Outcome: Progressing   Problem: Coping: Goal: Level of anxiety will decrease Outcome: Progressing   Problem: Elimination: Goal: Will not experience complications related to bowel motility Outcome: Progressing Goal: Will not experience complications related to urinary retention Outcome: Progressing   Problem: Pain Managment: Goal: General experience of comfort will improve Outcome: Progressing   Problem: Safety: Goal: Ability to remain free from injury will improve Outcome: Progressing   Problem: Skin Integrity: Goal: Risk for impaired skin integrity will decrease Outcome: Progressing

## 2023-01-19 NOTE — Progress Notes (Addendum)
Progress Note   Patient: Judy [name] Judy [name]:[id number] DOB: [birth year] DOA: 01/16/2023     3 DOS: the patient was seen and examined on 01/19/2023   Brief hospital course:  Judy [name] is a 89 y.o. female with medical history significant of hypertension, hypertension, constipation presenting with acute respiratory failure with hypoxia, pneumonia, volume overload.  History primarily from patient's neighbor, Hosie Poisson who is healthcare power of attorney ([phone]).  Patient noted to be admitted July 29 July 30 for lactic acidosis and severe constipation.  Symptomatically improved after large bowel movement status post enema.  Per report, patient with increased work of breathing over the past 1 to 2 days.  Patient lives at home alone besides her neighbor Judy [name].  Patient's neighbor noticed patient with worsening shortness of breath on exertion as well as overall fatigue.  No reported fevers or chills.  No reported nausea or vomiting.  Judy [name] does report patient having episodes of induced vomiting because of reflux symptoms.  No abdominal pain or diarrhea.  Has symptomatically felt better after episode of constipation status post enema while hospitalized. Presented to the ER afebrile, hemodynamically stable.  Satting in mid 90s on 2 L nasal cannula.  White count 27, hemoglobin 9, platelets 972, creatinine 1.3, glucose 147.  CT chest and pelvis noted moderate bilateral pleural effusions and dependent atelectasis.    Assessment and Plan:  * Acute respiratory failure with hypoxia [facility]) Patient presented to the ER for evaluation of worsening shortness of breath and had  a new  O2 requirement, initially requiring 2 L nasal cannula to keep O2 sats greater than 95% CT imaging with noted moderate pleural effusions and atelectasis. Acute respiratory failure appears to be secondary to acute diastolic CHF and possible community-acquired pneumonia. She has been weaned off oxygen and is currently on  room air         Acute diastolic dysfunction congestive heart failure Anasarca (secondary to hypoalbuminemia) 2D echocardiogram shows an LVEF of 55 to 60% with evidence of grade 1 diastolic dysfunction Imaging showed moderate bilateral pleural effusions with dependent atelectasis Continue Lasix 40 mg IV daily. Patient has had 1.7 kg weight loss Hold beta-blockers for now due to acute exacerbation       Community-acquired pneumonia Leukocytosis shows a downward trend. 27.8 >> 16.4 Patient was treated empirically with Rocephin and Zithromax and has been switched to Augmentin to complete a 7-day course of therapy Blood culture showed no growth in 3 days       Acute kidney injury Baseline serum creatinine 0.76 >> 1.32 >> 1.04 >> 0.68 Renal function is back to baseline Monitor closely while on diuretic therapy       Thrombocytosis/leukocytosis/iron deficiency anemia Most likely secondary to an acute infectious etiology Patient noted to have a drop in her H&H 7.7 >> 6.4 No obvious bleeding.  Stool for occult blood pending. Patient was transfused 1 unit of packed RBC on 01/18/23 Continue to hold aspirin and Lovenox         Gastroesophageal reflux disease without esophagitis PPI and Carafate       Hypertension BP stable Monitor with diuresis       Hypokalemia Secondary to diuretic therapy Supplement potassium       Subjective: Patient is seen and examined at the bedside.  Has not had a bowel movement.  Physical Exam: Vitals:   01/19/23 0511 01/19/23 0759 01/19/23 0903 01/19/23 0915  BP: (!) 125/58 115/62    Pulse: 79 78  Resp: 16 20    Temp: 98.2 F (36.8 C) 97.8 F (36.6 C)    TempSrc:      SpO2: 99% 100% 96% 95%  Weight:        Vitals and nursing note reviewed.  Constitutional:      Appearance: She is obese.  HENT:     Head: Normocephalic and atraumatic.     Nose: Nose normal.     Comments: Nasal cannula    Mouth/Throat:     Mouth: Mucous  membranes are moist.  Eyes:     Comments: Pale conjunctivae  Cardiovascular:     Rate and Rhythm: Normal rate and regular rhythm.  Pulmonary:     Breath sounds: Rales present.  Abdominal:     General: Bowel sounds are normal.     Palpations: Abdomen is soft.  Musculoskeletal:     Cervical back: Normal range of motion and neck supple.     Right lower leg: Edema present.     Left lower leg: Edema present.  Skin:    General: Skin is warm and dry.  Neurological:     Motor: Weakness present.  Psychiatric:        Mood and Affect: Mood normal.        Behavior: Behavior normal.    Data Reviewed: Labs reviewed. White count 11.5, hemoglobin 8.5, platelet count 539, potassium 3.4, iron level 20, total iron-binding capacity 123 There are no new results to review at this time.  Family Communication: Plan of care discussed with patient and her caregiver at the bedside.  All questions and concerns have been addressed.  Disposition: Status is: Inpatient Remains inpatient appropriate because: On IV diuresis  Planned Discharge Destination: Skilled nursing facility    Time spent: 33 minutes  Author: Lucile Shutters, MD 01/19/2023 11:49 AM  For on call review [email or web address].

## 2023-01-20 DIAGNOSIS — J9601 Acute respiratory failure with hypoxia: Secondary | ICD-10-CM | POA: Diagnosis not present

## 2023-01-20 DIAGNOSIS — E440 Moderate protein-calorie malnutrition: Secondary | ICD-10-CM | POA: Insufficient documentation

## 2023-01-20 LAB — BASIC METABOLIC PANEL
Anion gap: 7 (ref 5–15)
BUN: 20 mg/dL (ref 8–23)
CO2: 23 mmol/L (ref 22–32)
Calcium: 7.6 mg/dL — ABNORMAL LOW (ref 8.9–10.3)
Chloride: 104 mmol/L (ref 98–111)
Creatinine, Ser: 0.63 mg/dL (ref 0.44–1.00)
GFR, Estimated: >60 mL/min (ref >60)
Glucose, Bld: 88 mg/dL (ref 70–99)
Potassium: 3.3 mmol/L — ABNORMAL LOW (ref 3.5–5.1)
Sodium: 134 mmol/L — ABNORMAL LOW (ref 135–145)

## 2023-01-20 LAB — CBC
HCT: 28 % — ABNORMAL LOW (ref 36.0–46.0)
Hemoglobin: 9.1 g/dL — ABNORMAL LOW (ref 12.0–15.0)
MCH: 28.3 pg (ref 26.0–34.0)
MCHC: 32.5 g/dL (ref 30.0–36.0)
MCV: 87.2 fL (ref 80.0–100.0)
Platelets: 548 10*3/uL — ABNORMAL HIGH (ref 150–400)
RBC: 3.21 MIL/uL — ABNORMAL LOW (ref 3.87–5.11)
RDW: 16.1 % — ABNORMAL HIGH (ref 11.5–15.5)
WBC: 11.4 10*3/uL — ABNORMAL HIGH (ref 4.0–10.5)
nRBC: 0 % (ref 0.0–0.2)

## 2023-01-20 MED ORDER — FERROUS SULFATE 325 (65 FE) MG PO TABS
325.0000 mg | ORAL_TABLET | Freq: Every day | ORAL | Status: DC
  Administered 2023-01-21 – 2023-01-25 (×5): 325 mg via ORAL
  Filled 2023-01-20 (×5): qty 1

## 2023-01-20 NOTE — TOC Progression Note (Signed)
Transition of Care [facility]) - Progression Note    Patient Details  Name: ZETHA HSIAO MRN: [id number] Date of Birth: 1926-05-20  Transition of [facility]) CM/SW Contact  Garret Reddish, RN Phone Number: 01/20/2023, 9:58 AM  Clinical Narrative:   Chart reviewed.  Patient continues to receive IV diuresis.  Meet with patient at bedside on yesterday evening.  Patient was able to order her meal tray.  Information left at bedside for personal care services.  HCPOA [name] will follow up with PCS services for additional support in the home.    TOC will continue to follow progress of patient.      Expected Discharge Plan:  (Pending Medical work up) Barriers to Discharge: Continued Medical Work up  Expected Discharge Plan and Services   Discharge Planning Services: CM Consult Post Acute Care Choice:  (Home with Home health v/s SNF) Living arrangements for the past 2 months: Single Family Home                                       Social Determinants of Health (SDOH) Interventions SDOH Screenings   Food Insecurity: No Food Insecurity (01/10/2023)  Housing: Low Risk  (01/10/2023)  Transportation Needs: No Transportation Needs (01/10/2023)  Utilities: Not At Risk (01/10/2023)  Social Connections: Unknown (10/27/2021)   Received from [facility]  Tobacco Use: Low Risk  (01/16/2023)    Readmission Risk Interventions    01/20/2023    9:57 AM  Readmission Risk Prevention Plan  Transportation Screening Complete  PCP or Specialist Appt within 5-7 Days Complete  Home Care Screening Complete  Medication Review (RN CM) Complete

## 2023-01-20 NOTE — Consult Note (Signed)
Consultation  Referring Provider:   Hospitalist   Admit date: 01/16/2023 Consult date: 01/20/2023         Reason for Consultation: Anemia              HPI:   Judy [name] is a 89 y.o. lady with history of hypertension and subdural hematoma from a fall a few years ago who is here with pneumonia along with moderate pleural effusions. She is visibly short of breath. We were consulted for anemia requiring 1 unit of pRBC's with improvement from 6 -->9 with one unit. She has no overt GI bleeding. She is actually constipated. She has never had EGD/Colonoscopy. A FOBT ordered by the hospitalist was positive. She had recent hospitalization for fecal impaction and she remains constipated. She is not eating or drinking much. Her husband passed away 1.5 years ago. She lives alone with help from neighbors. A friend is with her today and subtly acknowledges she doesn't think she has been taking care of herself at home. The patient has temporal wasting and appears severely malnourished. We discussed about procedures and how in her current state they would be contraindicated but even if she were to recover, if there was a malignancy in her colon or upper GI tract, would she want to go through potential surgery/chemotherapy and she said she wouldn't.  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   transferred from [facility] to [facility] on 10/12/19, discharged from ED    Past Surgical History:  Procedure Laterality Date   CATARACT EXTRACTION W/PHACO Left 01/21/2020   Procedure: CATARACT EXTRACTION PHACO AND INTRAOCULAR LENS PLACEMENT (IOC) LEFT;  Surgeon: Nevada Crane, MD;  Location: [facility];  Service: Ophthalmology;  Laterality: Left;   CATARACT EXTRACTION W/PHACO Right 03/10/2020   Procedure: CATARACT EXTRACTION PHACO AND INTRAOCULAR LENS PLACEMENT (IOC) RIGHT 4.49  00:44.5;  Surgeon: Nevada Crane, MD;   Location: [facility];  Service: Ophthalmology;  Laterality: Right;   TOTAL HIP ARTHROPLASTY Right 2009    History reviewed. No pertinent family history.   Social History   Tobacco Use   Smoking status: Never   Smokeless tobacco: Never  Substance Use Topics   Alcohol use: No   Drug use: Never    Prior to Admission medications   Medication Sig Start Date End Date Taking? Authorizing Provider  aspirin 81 MG tablet Take 81 mg by mouth daily.   Yes [provider]  bisacodyl (DULCOLAX) 5 MG EC tablet Take 1 tablet (5 mg total) by mouth daily as needed for moderate constipation. 01/11/23  Yes Marcelino Duster, MD  Calcium Carbonate 500 MG CHEW Chew 500 mg by mouth daily.   Yes [provider]  cholecalciferol (VITAMIN D3) 25 MCG (1000 UNIT) tablet Take 1,000 Units by mouth daily.   Yes [provider]  docusate sodium (COLACE) 100 MG capsule Take 1 capsule (100 mg total) by mouth 2 (two) times daily. 01/11/23  Yes Marcelino Duster, MD  loratadine (CLARITIN) 10 MG tablet TAKE 1 TABLET BY MOUTH ONCE DAILY 11/11/22  Yes Orson Eva, NP  ondansetron (ZOFRAN-ODT) 4 MG disintegrating tablet Take 1 tablet (4 mg total) by mouth every 8 (eight) hours as needed for nausea or vomiting. 01/04/23  Yes Minna Antis, MD  oxybutynin (DITROPAN) 5 MG tablet TAKE 1 TABLET BY MOUTH TWICE DAILY. 09/20/22  Yes Orson Eva, NP  pantoprazole (PROTONIX) 40 MG tablet Take 1 tablet (40 mg total) by mouth daily. Patient taking differently: Take 40 mg by mouth 2 (two) times daily. 01/04/23 01/04/24 Yes Paduchowski, Caryn Bee, MD  senna (SENOKOT) 8.6 MG TABS tablet Take 1 tablet (8.6 mg total) by mouth 2 (two) times daily. 01/11/23  Yes Marcelino Duster, MD  vitamin E (VITAMIN E) 180 MG (400 UNITS) capsule Take 400 Units by mouth daily.   Yes [provider]  benzonatate (TESSALON PERLES) 100 MG capsule Take 1 capsule (100 mg total) by mouth 3 (three) times  daily as needed for cough. Patient not taking: Reported on 01/16/2023 08/02/22 08/02/23  Orson Eva, NP  sucralfate (CARAFATE) 1 GM/10ML suspension Take 10 mLs (1 g total) by mouth 4 (four) times daily -  with meals and at bedtime. Patient not taking: Reported on 01/16/2023 01/06/23   Marisue Ivan, NP    Current Facility-Administered Medications  Medication Dose Route Frequency Provider Last Rate Last Admin   amoxicillin-clavulanate (AUGMENTIN) 400-57 MG/5ML suspension 872 mg  875 mg of amoxicillin Oral Q12H Meegan, Eryn, RPH   872 mg at 01/20/23 1003   feeding supplement (ENSURE ENLIVE / ENSURE PLUS) liquid 237 mL  237 mL Oral TID BM Agbata, Tochukwu, MD   237 mL at 01/20/23 1004   [START ON 01/21/2023] ferrous sulfate tablet 325 mg  325 mg Oral Q breakfast Agbata, Tochukwu, MD       furosemide (LASIX) injection 40 mg  40 mg Intravenous Daily Agbata, Tochukwu, MD   40 mg at 01/20/23 1003   ipratropium-albuterol (DUONEB) 0.5-2.5 (3) MG/3ML nebulizer solution 3 mL  3 mL Nebulization Q6H PRN Agbata, Tochukwu, MD   3 mL at 01/19/23 0914   multivitamin with minerals tablet 1 tablet  1 tablet Oral Daily Agbata, Tochukwu, MD   1 tablet at 01/20/23 1004   ondansetron (ZOFRAN) injection 4 mg  4 mg Intravenous Q6H PRN Floydene Flock, MD   4 mg at 01/17/23 1638   pantoprazole (PROTONIX) EC tablet 40 mg  40 mg Oral Daily Floydene Flock, MD   40 mg at 01/20/23 1004   polyethylene glycol (MIRALAX / GLYCOLAX) packet 17 g  17 g Oral Daily Agbata, Tochukwu, MD   17 g at 01/20/23 1003   potassium chloride SA (KLOR-CON M) CR tablet 40 mEq  40 mEq Oral Daily Agbata, Tochukwu, MD   40 mEq at 01/20/23 1004   senna (SENOKOT) tablet 8.6 mg  1 tablet Oral BID Floydene Flock, MD   8.6 mg at 01/20/23 1004   sucralfate (CARAFATE) 1 GM/10ML suspension 1 g  1 g Oral TID WC & HS Floydene Flock, MD   1 g at 01/20/23 1319    Allergies as of 01/16/2023   (No Known Allergies)     Review of Systems:    All systems  reviewed and negative except where noted in HPI.  Review of Systems  Constitutional:  Negative for chills and fever.  Respiratory:  Positive for shortness of breath.   Cardiovascular:  Negative for chest pain.  Gastrointestinal:  Positive for abdominal pain.  Musculoskeletal:  Negative for back pain.  Skin:  Negative for rash.  Neurological:  Negative for focal weakness.  Psychiatric/Behavioral:  Negative for substance abuse.   All other systems reviewed and are negative.     Physical Exam:  Vital signs in last 24 hours: Temp:  [97.6 F (36.4 C)-98.1 F (36.7 C)] 98 F (36.7 C) (08/08 0738) Pulse  Rate:  [79-96] 79 (08/08 0738) Resp:  [17-20] 20 (08/08 0738) BP: (106-139)/(60-76) 139/76 (08/08 0738) SpO2:  [97 %] 97 % (08/08 0738) Weight:  [70 kg] 70 kg (08/08 0611) Last BM Date : 01/12/23 General:   Appears severely malnourished Head:  Normocephalic and atraumatic. Eyes:   No icterus.   Conjunctiva pink. Ears:  Normal auditory acuity. Mouth: Mucosa pink moist, no lesions. Neck:  Supple; no masses felt Lungs:  No respiratory distress Abdomen:   Flat, soft, nondistended, nontender Msk:  Symmetrical without gross deformities. Neurologic:  Alert and  oriented x4;  No focal deficits Skin:  Warm, dry, pink without significant lesions or rashes. Psych:  Alert and cooperative. Normal affect.  LAB RESULTS: Recent Labs    01/18/23 0521 01/19/23 0623 01/20/23 0901  WBC 16.4* 11.5* 11.4*  HGB 6.4* 8.5* 9.1*  HCT 20.2* 26.2* 28.0*  PLT 569* 539* 548*   BMET Recent Labs    01/18/23 0521 01/19/23 0623 01/20/23 0901  NA 137 137 134*  K 3.2* 3.4* 3.3*  CL 110 110 104  CO2 20* 21* 23  GLUCOSE 80 86 88  BUN 41* 28* 20  CREATININE 1.07* 0.68 0.63  CALCIUM 7.4* 7.5* 7.6*   LFT No results for input(s): "PROT", "ALBUMIN", "AST", "ALT", "ALKPHOS", "BILITOT", "BILIDIR", "IBILI" in the last 72 hours. PT/INR No results for input(s): "LABPROT", "INR" in the last 72  hours.  STUDIES: DG Chest Port 1 View  Result Date: 01/19/2023 CLINICAL DATA:  Shortness of breath EXAM: PORTABLE CHEST 1 VIEW COMPARISON:  Chest radiograph 01/16/2023 FINDINGS: The cardiomediastinal silhouette is stable, allowing for differences in patient rotation. Small to moderate-sized right and small left left pleural effusions are again seen, increased on the right since the prior study, with adjacent airspace opacity in the lung bases. The upper lobes are clear. There is no pneumothorax There is no acute osseous abnormality. There is advanced degenerative change of both shoulders. The nonunited distal left clavicle fracture is unchanged. IMPRESSION: Right shoulder than left pleural effusions with adjacent airspace opacities which may reflect atelectasis or infection, slightly increased on the right since the prior study. Electronically Signed   By: Lesia Hausen M.D.   On: 01/19/2023 09:40       Impression / Plan:   89 y/o lady with severe malnutrition here for pneumonia treatment with bilaterally moderate pleural effusions. GI consulted for anemia that has responded from 6--->9 with one unit. Even in a healthy individual we would defer any procedures until pneumonia adequately treated but in this lady with advanced age and severe malnutrition who wouldn't want any treatment even if a malignancy was found, I would not recommend any EGD/Colonoscopy. I would recommend palliative care/hospice as she would qualify based on malnutrition alone.  - no plans for EGD/Colonoscopy given advanced age, diuresis with lasix, no overt GI bleeding, and patient would not want any treatment for a malignancy if one was found - would recommend palliative care/hospice evaluation as patient obviously not able to care for self at home and would qualify for hospice - please do not order occult blood testing in the hospital as there is no indication for this test to be performed in an inpatient setting as it provides no  useful information and is a waste of resources - work on helping with constipation, may need an enema - we will sign-off, please call with any questions or concerns  Merlyn Lot MD, MPH [facility]

## 2023-01-20 NOTE — Progress Notes (Addendum)
Progress Note   Patient: Judy [name] UVO:[id number] DOB: December 04, 1925 DOA: 01/16/2023     4 DOS: the patient was seen and examined on 01/20/2023   Brief hospital course:  ZUBAIDA DEGRANDIS is a 89 y.o. female with medical history significant of hypertension, hypertension, constipation presenting with acute respiratory failure with hypoxia, pneumonia, volume overload.  History primarily from patient's neighbor, Hosie Poisson who is healthcare power of attorney ([phone]).  Patient noted to be admitted July 29 July 30 for lactic acidosis and severe constipation.  Symptomatically improved after large bowel movement status post enema.  Per report, patient with increased work of breathing over the past 1 to 2 days.  Patient lives at home alone besides her neighbor [name].  Patient's neighbor noticed patient with worsening shortness of breath on exertion as well as overall fatigue.  No reported fevers or chills.  No reported nausea or vomiting.  [name] does report patient having episodes of induced vomiting because of reflux symptoms.  No abdominal pain or diarrhea.  Has symptomatically felt better after episode of constipation status post enema while hospitalized. Presented to the ER afebrile, hemodynamically stable.  Satting in mid 90s on 2 L nasal cannula.  White count 27, hemoglobin 9, platelets 972, creatinine 1.3, glucose 147.  CT chest and pelvis noted moderate bilateral pleural effusions and dependent atelectasis.      Assessment and Plan:  * Acute respiratory failure with hypoxia (HCC) Patient presented to the ER for evaluation of worsening shortness of breath and had a new  O2 requirement, initially requiring 2 L nasal cannula to keep O2 sats greater than 95%. CT imaging with noted moderate pleural effusions and atelectasis. Acute respiratory failure appears to be secondary to acute diastolic CHF and community-acquired pneumonia. She has been weaned off oxygen and is currently on room  air         Acute diastolic dysfunction congestive heart failure Anasarca (secondary to hypoalbuminemia) 2D echocardiogram shows an LVEF of 55 to 60% with evidence of grade 1 diastolic dysfunction Imaging showed moderate bilateral pleural effusions with dependent atelectasis Continue Lasix 40 mg IV daily. Patient has had 3 kg weight loss since her admission Hold beta-blockers for now due to acute exacerbation       Community-acquired pneumonia Leukocytosis shows a downward trend. 27.8 >> 16.4 >> 11.4 Patient was treated empirically with Rocephin and Zithromax and has been switched to Augmentin to complete a 7-day course of therapy Blood culture showed no growth in 4 days       Acute kidney injury Baseline serum creatinine 0.76 >> 1.32 >> 1.04 >> 0.68 Renal function is back to baseline Monitor closely while on diuretic therapy       Thrombocytosis/leukocytosis/iron deficiency anemia Most likely secondary to an acute infectious etiology Patient noted to have a drop in her H&H 7.7 >> 6.4 No obvious bleeding.  Stool for occult blood pending. Patient was transfused 1 unit of packed RBC on 01/18/23 with improvement in her H&H Continue to hold aspirin and Lovenox until Hemoccult is resulted           Gastroesophageal reflux disease without esophagitis PPI and Carafate       Hypertension BP is stable Monitor with diuresis       Hypokalemia Secondary to diuretic therapy Supplement potassium    Moderate malnutrition Related to chronic illness as evidenced by mild muscle depletion, moderate muscle depletion, mild to moderate fat depletion and edema.    INTERVENTION:  -MVI with minerals  daily -Ensure Enlive po TID, each supplement provides 350 kcal and 20 grams of protein -Magic cup TID with meals, each supplement provides 290 kcal and 9 grams of protein  -Liberalize diet to regular for widest variety of meal selections         Subjective: Patient is seen and  examined at the bedside.   Physical Exam: Vitals:   01/19/23 2001 01/20/23 0507 01/20/23 0611 01/20/23 0738  BP: 106/60 126/63  139/76  Pulse: 90 80  79  Resp: 17 20  20   Temp: 97.6 F (36.4 C) 97.8 F (36.6 C)  98 F (36.7 C)  TempSrc: Oral Oral    SpO2: 97% 97%  97%  Weight:   70 kg    Vitals and nursing note reviewed.  Constitutional:      Appearance: She is obese.  HENT:     Head: Normocephalic and atraumatic.     Nose: Nose normal.     Comments: Nasal cannula    Mouth/Throat:     Mouth: Mucous membranes are moist.  Eyes:     Comments: Pale conjunctivae  Cardiovascular:     Rate and Rhythm: Normal rate and regular rhythm.  Pulmonary:     Breath sounds: Rales present at the bases  Abdominal:     General: Bowel sounds are normal.     Palpations: Abdomen is soft.  Musculoskeletal:     Cervical back: Normal range of motion and neck supple.     Right lower leg: Edema present.     Left lower leg: Edema present.  Skin:    General: Skin is warm and dry.  Neurological:     Motor: Weakness present.  Psychiatric:        Mood and Affect: Mood normal.        Behavior: Behavior normal    Data Reviewed: Labs reviewed.  Potassium 3.3, white count 11.4, hemoglobin 9.1, platelet count 548 There are no new results to review at this time.  Family Communication: Plan of care discussed with patient at the bedside as well as her HPOA Hosie Poisson over the phone. All questions and concerns have been addressed. She verbalizes understanding and agrees with the plan.  Disposition: Status is: Inpatient Remains inpatient appropriate because: Receiving IV diuresis  Planned Discharge Destination:  TBD    Time spent: 33  minutes  Author: Lucile Shutters, MD 01/20/2023 10:27 AM  For on call review [email or web address].

## 2023-01-20 NOTE — Care Management Important Message (Signed)
Important Message  Patient Details  Name: Judy [name] MRN: [id number] Date of Birth: 06-Mar-1926   Medicare Important Message Given:  N/A - LOS <3 / Initial given by admissions     Olegario Messier A  01/20/2023, 8:26 AM

## 2023-01-20 NOTE — Progress Notes (Signed)
Consult to HF Navigation Team Placed. Unfortunately, this patient does not meet criteria given advanced age with HFpEF. Will provide recommendations before signing off. Please feel free to reach out with any questions or medication assistance needs.  Assessment/Recommendation: Current HF guidelines recommend SGLT2i first line for HFpEF, with spironolactone as the preferred adjunct agent. SGLT2i are not ideal for this patient given age and mobility. Renal function has improved and patient remains hypokalemic. Can consider spironolactone 12.5 mg daily for treatment of HFpEF, volume overload, and hypokalemia  Thank you for involving the HF Navigation Team in this patient's care.  Enos Fling, PharmD, BCPS Clinical Pharmacist 01/20/2023 12:50 PM

## 2023-01-21 ENCOUNTER — Other Ambulatory Visit: Payer: Self-pay | Admitting: *Deleted

## 2023-01-21 LAB — CBC
HCT: 33.3 % — ABNORMAL LOW (ref 36.0–46.0)
Hemoglobin: 10.7 g/dL — ABNORMAL LOW (ref 12.0–15.0)
MCH: 28 pg (ref 26.0–34.0)
MCHC: 32.1 g/dL (ref 30.0–36.0)
MCV: 87.2 fL (ref 80.0–100.0)
Platelets: 729 10*3/uL — ABNORMAL HIGH (ref 150–400)
RBC: 3.82 MIL/uL — ABNORMAL LOW (ref 3.87–5.11)
RDW: 15.8 % — ABNORMAL HIGH (ref 11.5–15.5)
WBC: 11.7 10*3/uL — ABNORMAL HIGH (ref 4.0–10.5)
nRBC: 0 % (ref 0.0–0.2)

## 2023-01-21 LAB — BASIC METABOLIC PANEL
Anion gap: 9 (ref 5–15)
BUN: 14 mg/dL (ref 8–23)
CO2: 25 mmol/L (ref 22–32)
Calcium: 8.1 mg/dL — ABNORMAL LOW (ref 8.9–10.3)
Chloride: 103 mmol/L (ref 98–111)
Creatinine, Ser: 0.63 mg/dL (ref 0.44–1.00)
GFR, Estimated: >60 mL/min (ref >60)
Glucose, Bld: 97 mg/dL (ref 70–99)
Potassium: 3.8 mmol/L (ref 3.5–5.1)
Sodium: 137 mmol/L (ref 135–145)

## 2023-01-21 MED ORDER — TRAZODONE HCL 50 MG PO TABS
25.0000 mg | ORAL_TABLET | Freq: Every evening | ORAL | Status: DC | PRN
  Administered 2023-01-22 – 2023-01-24 (×2): 25 mg via ORAL
  Filled 2023-01-21 (×2): qty 1

## 2023-01-21 NOTE — Care Management Important Message (Signed)
Important Message  Patient Details  Name: Judy [name] MRN: [id number] Date of Birth: Aug 11, 1925   Medicare Important Message Given:  Yes     Olegario Messier A  01/21/2023, 11:30 AM

## 2023-01-21 NOTE — Plan of Care (Signed)
  Problem: Fluid Volume: Goal: Hemodynamic stability will improve Outcome: Progressing   Problem: Activity: Goal: Capacity to carry out activities will improve Outcome: Progressing   Problem: Cardiac: Goal: Ability to achieve and maintain adequate cardiopulmonary perfusion will improve Outcome: Progressing   Problem: Activity: Goal: Ability to tolerate increased activity will improve Outcome: Progressing   Problem: Clinical Measurements: Goal: Ability to maintain a body temperature in the normal range will improve Outcome: Progressing   Problem: Respiratory: Goal: Ability to maintain adequate ventilation will improve Outcome: Progressing   Problem: Education: Goal: Knowledge of General Education information will improve Description: Including pain rating scale, medication(s)/side effects and non-pharmacologic comfort measures Outcome: Progressing   Problem: Activity: Goal: Risk for activity intolerance will decrease Outcome: Progressing   Problem: Nutrition: Goal: Adequate nutrition will be maintained Outcome: Progressing   Problem: Coping: Goal: Level of anxiety will decrease Outcome: Progressing   Problem: Elimination: Goal: Will not experience complications related to bowel motility Outcome: Progressing   Problem: Pain Managment: Goal: General experience of comfort will improve Outcome: Progressing   Problem: Safety: Goal: Ability to remain free from injury will improve Outcome: Progressing   Problem: Skin Integrity: Goal: Risk for impaired skin integrity will decrease Outcome: Progressing

## 2023-01-21 NOTE — Progress Notes (Addendum)
Physical Therapy Treatment Patient Details Name: Judy [name] MRN: [id number] DOB: 07/02/1925 Today's Date: 01/21/2023   History of Present Illness 89 y.o. female with medical history significant of HTN, HLD, constipation, GERD, and stage 3A CKD presenting with acute respiratory failure with hypoxia, pneumonia, volume overload. Recently admitted 7/29-7/30 for lactic acidosis and severe constipation.    PT Comments  Pt is received in bed, she is agreeable to PT session. Pt cont to perform bed mobility mod A x2 and transfers mod A x2 with max A for scooting EOB with facilitation of BLE and facilitation at hip during SPT. Cuing required for hand placement, forward lean, and upright posture for transfers. Pt cont to demonstrate poor activity tolerance as seen by decreased standing time. Pt in recliner with chair alarm, nursing call bed, and phlebotomy at bedside. Vitals were assessed throughout session and maintained WNL with sats of 95-97%. Pt would benefit from skilled PT to address above deficits and promote optimal return to PLOF.    If plan is discharge home, recommend the following: Two people to help with walking and/or transfers;Two people to help with bathing/dressing/bathroom;Help with stairs or ramp for entrance;Assist for transportation;Assistance with cooking/housework   Can travel by private vehicle     No  Equipment Recommendations  None recommended by PT    Recommendations for Other Services       Precautions / Restrictions Precautions Precautions: Fall Restrictions Weight Bearing Restrictions: No     Mobility  Bed Mobility Overal bed mobility: Needs Assistance Bed Mobility: Supine to Sit     Supine to sit: Mod assist, +2 for physical assistance Sit to supine: Mod assist, +2 for physical assistance   General bed mobility comments: Improved initiation of bed mobility but cont to require mod A x2 and cuing for sequencing/hand placement. Pt required mod A x2 for  bed mobility with occasional max A for facilitation of BLE movement to EOB.    Transfers Overall transfer level: Needs assistance Equipment used: Rolling walker (2 wheels) Transfers: Sit to/from Stand, Bed to chair/wheelchair/BSC Sit to Stand: Mod assist, +2 physical assistance, From elevated surface Stand pivot transfers: Mod assist, +2 physical assistance         General transfer comment: max A for scooting to EOB, mod A x2 for stand pivot transfers to recliner. Cuing for hand placement, forward lean, and upright posture.    Ambulation/Gait               General Gait Details: NT due to poor activity tolerance   Stairs             Wheelchair Mobility     Tilt Bed    Modified Rankin (Stroke Patients Only)       Balance Overall balance assessment: Needs assistance Sitting-balance support: Bilateral upper extremity supported, Feet supported Sitting balance-Leahy Scale: Fair Sitting balance - Comments: Able to maintain EOB seated balance but required UE support. Demonstrate R lat lean with fw/slouch posture Postural control: Right lateral lean Standing balance support: Reliant on assistive device for balance, During functional activity Standing balance-Leahy Scale: Fair Standing balance comment: Forward flex posture requiring cuing to correct. Able to correct to upright posture but unable to maintain due to fatigue                            Cognition Arousal: Alert Behavior During Therapy: [facility] for tasks assessed/performed Overall Cognitive Status: Impaired/Different from baseline Area of Impairment:  Following commands                       Following Commands: Follows one step commands with increased time Safety/Judgement: Decreased awareness of deficits   Problem Solving: Slow processing, Decreased initiation, Difficulty sequencing, Requires verbal cues, Requires tactile cues General Comments: Delayed processing and facilitation  required for hand placements        Exercises      General Comments        Pertinent Vitals/Pain Pain Assessment Pain Assessment: No/denies pain Breathing: normal    Home Living                          Prior Function            PT Goals (current goals can now be found in the care plan section) Acute Rehab PT Goals Patient Stated Goal: unable to state PT Goal Formulation: With patient Time For Goal Achievement: 01/31/23 Potential to Achieve Goals: Fair Progress towards PT goals: Progressing toward goals    Frequency    Min 1X/week      PT Plan Current plan remains appropriate    Co-evaluation PT/OT/SLP Co-Evaluation/Treatment: Yes Reason for Co-Treatment: For patient/therapist safety;To address functional/ADL transfers PT goals addressed during session: Mobility/safety with mobility;Balance        AM-PAC PT "6 Clicks" Mobility   Outcome Measure  Help needed turning from your back to your side while in a flat bed without using bedrails?: A Lot Help needed moving from lying on your back to sitting on the side of a flat bed without using bedrails?: A Lot Help needed moving to and from a bed to a chair (including a wheelchair)?: A Lot Help needed standing up from a chair using your arms (e.g., wheelchair or bedside chair)?: Total Help needed to walk in hospital room?: Total Help needed climbing 3-5 steps with a railing? : Total 6 Click Score: 9    End of Session Equipment Utilized During Treatment: Gait belt Activity Tolerance: Patient tolerated treatment well;Patient limited by fatigue Patient left: in chair;with call bell/phone within reach;with bed alarm set;with family/visitor present;Other (comment) (Phlebotomy at beside at end of session) Nurse Communication: Mobility status PT Visit Diagnosis: Other abnormalities of gait and mobility (R26.89);Difficulty in walking, not elsewhere classified (R26.2);Muscle weakness (generalized) (M62.81)      Time: 1610-9604 PT Time Calculation (min) (ACUTE ONLY): 16 min  Charges:                            Elmon Else, SPT      01/21/2023, 10:54 AM

## 2023-01-21 NOTE — Plan of Care (Signed)
  Problem: Fluid Volume: Goal: Hemodynamic stability will improve Outcome: Progressing   Problem: Clinical Measurements: Goal: Diagnostic test results will improve Outcome: Progressing Goal: Signs and symptoms of infection will decrease Outcome: Progressing   Problem: Respiratory: Goal: Ability to maintain adequate ventilation will improve Outcome: Progressing   Problem: Education: Goal: Ability to demonstrate management of disease process will improve Outcome: Progressing Goal: Ability to verbalize understanding of medication therapies will improve Outcome: Progressing Goal: Individualized Educational Video(s) Outcome: Progressing   Problem: Activity: Goal: Capacity to carry out activities will improve Outcome: Progressing   Problem: Cardiac: Goal: Ability to achieve and maintain adequate cardiopulmonary perfusion will improve Outcome: Progressing   Problem: Activity: Goal: Ability to tolerate increased activity will improve Outcome: Progressing   Problem: Clinical Measurements: Goal: Ability to maintain a body temperature in the normal range will improve Outcome: Progressing   Problem: Respiratory: Goal: Ability to maintain adequate ventilation will improve Outcome: Progressing Goal: Ability to maintain a clear airway will improve Outcome: Progressing   Problem: Education: Goal: Knowledge of General Education information will improve Description: Including pain rating scale, medication(s)/side effects and non-pharmacologic comfort measures Outcome: Progressing   Problem: Health Behavior/Discharge Planning: Goal: Ability to manage health-related needs will improve Outcome: Progressing   Problem: Clinical Measurements: Goal: Ability to maintain clinical measurements within normal limits will improve Outcome: Progressing Goal: Will remain free from infection Outcome: Progressing Goal: Diagnostic test results will improve Outcome: Progressing Goal:  Respiratory complications will improve Outcome: Progressing Goal: Cardiovascular complication will be avoided Outcome: Progressing   Problem: Activity: Goal: Risk for activity intolerance will decrease Outcome: Progressing   Problem: Nutrition: Goal: Adequate nutrition will be maintained Outcome: Progressing   Problem: Coping: Goal: Level of anxiety will decrease Outcome: Progressing   Problem: Elimination: Goal: Will not experience complications related to bowel motility Outcome: Progressing Goal: Will not experience complications related to urinary retention Outcome: Progressing   Problem: Pain Managment: Goal: General experience of comfort will improve Outcome: Progressing   Problem: Safety: Goal: Ability to remain free from injury will improve Outcome: Progressing   Problem: Skin Integrity: Goal: Risk for impaired skin integrity will decrease Outcome: Progressing

## 2023-01-21 NOTE — TOC Progression Note (Signed)
Transition of Care [facility]) - Progression Note    Patient Details  Name: Judy [name] MRN: [id number] Date of Birth: [birth year]  Transition of Care [facility]) CM/SW Contact  Garret Reddish, RN Phone Number: 01/21/2023, 3:17 PM  Clinical Narrative:   Chart reviewed.  I have spoken to patient's Johnna Acosta and she informs me that she has not spoken to [name].  She reports that she is going to try and speak with [name] about Short term Rehab.    [name] was agreeable to SNF workup and faxing out to [facility] facilities.  I have informed her that [facility] has weekend Case Managers that will follow up with bed offers.    I have completed Fl 2 and faxed out to [place] area facilities.  TOC will continue to follow for discharge planning.      Expected Discharge Plan:  (Pending Medical work up) Barriers to Discharge: Continued Medical Work up  Expected Discharge Plan and Services   Discharge Planning Services: CM Consult Post Acute Care Choice:  (Home with Home health v/s SNF) Living arrangements for the past 2 months: Single Family Home                                       Social Determinants of Health (SDOH) Interventions SDOH Screenings   Food Insecurity: No Food Insecurity (01/10/2023)  Housing: Low Risk  (01/10/2023)  Transportation Needs: No Transportation Needs (01/10/2023)  Utilities: Not At Risk (01/10/2023)  Social Connections: Unknown (10/27/2021)   Received from [facility]  Tobacco Use: Low Risk  (01/16/2023)    Readmission Risk Interventions    01/20/2023    9:57 AM  Readmission Risk Prevention Plan  Transportation Screening Complete  PCP or Specialist Appt within 5-7 Days Complete  Home Care Screening Complete  Medication Review (RN CM) Complete

## 2023-01-21 NOTE — Progress Notes (Signed)
Occupational Therapy Treatment Patient Details Name: Judy [name] MRN: [id number] DOB: [birth year] Today's Date: 01/21/2023   History of present illness 89 y.o. female with medical history significant of HTN, HLD, constipation, GERD, and stage 3A CKD presenting with acute respiratory failure with hypoxia, pneumonia, volume overload. Recently admitted 7/29-7/30 for lactic acidosis and severe constipation.   OT comments  Pt seen for OT tx and cotx with PT to safely engage in ADL mobility given current functional deficits. Pt alert, agreeable, and denies pain. Pt required MOD-MAX A for sup>sit, MOD A +2 for STS and SPT with VC for hand placement, RW mgt, and sequencing. Pt tolerated standing for ~30 seconds once in front of the recliner for MAX A pericare. Pt endorsed fatigue following activity. Pt continues to require heavy +2 assist for all aspects of ADL mobility and heavy assist for ADL. Recommendations remain appropriate. Will continue to benefit to maximize return to PLOF.       If plan is discharge home, recommend the following:  A lot of help with bathing/dressing/bathroom;Assistance with cooking/housework;Direct supervision/assist for medications management;Direct supervision/assist for financial management;Help with stairs or ramp for entrance;Two people to help with walking and/or transfers   Equipment Recommendations  Other (comment) (per next venue)    Recommendations for Other Services      Precautions / Restrictions Precautions Precautions: Fall Restrictions Weight Bearing Restrictions: No       Mobility Bed Mobility Overal bed mobility: Needs Assistance Bed Mobility: Supine to Sit     Supine to sit: Mod assist, +2 for physical assistance          Transfers Overall transfer level: Needs assistance Equipment used: Rolling walker (2 wheels) Transfers: Sit to/from Stand, Bed to chair/wheelchair/BSC Sit to Stand: Mod assist, +2 physical assistance, From  elevated surface Stand pivot transfers: Mod assist, +2 physical assistance         General transfer comment: max A for scooting to EOB, mod A x2 for stand pivot transfers to recliner. Cuing for hand placement, forward lean, and upright posture.     Balance Overall balance assessment: Needs assistance Sitting-balance support: Bilateral upper extremity supported, Feet supported Sitting balance-Leahy Scale: Fair Sitting balance - Comments: Able to maintain EOB seated balance but required UE support. Demonstrate R lat lean with fw/slouch posture Postural control: Right lateral lean Standing balance support: Reliant on assistive device for balance, During functional activity Standing balance-Leahy Scale: Fair                             ADL either performed or assessed with clinical judgement   ADL Overall ADL's : Needs assistance/impaired                             Toileting- Clothing Manipulation and Hygiene: Sit to/from stand;Total assistance       Functional mobility during ADLs: Moderate assistance;+2 for physical assistance;Rolling walker (2 wheels)      Extremity/Trunk Assessment              Vision       Perception     Praxis      Cognition Arousal: Alert Behavior During Therapy: WFL for tasks assessed/performed Overall Cognitive Status: Impaired/Different from baseline Area of Impairment: Following commands, Safety/judgement, Problem solving                       Following  Commands: Follows one step commands with increased time Safety/Judgement: Decreased awareness of deficits   Problem Solving: Slow processing, Decreased initiation, Difficulty sequencing, Requires verbal cues, Requires tactile cues          Exercises      Shoulder Instructions       General Comments      Pertinent Vitals/ Pain       Pain Assessment Pain Assessment: No/denies pain  Home Living                                           Prior Functioning/Environment              Frequency  Min 1X/week        Progress Toward Goals  OT Goals(current goals can now be found in the care plan section)  Progress towards OT goals: Progressing toward goals  Acute Rehab OT Goals Patient Stated Goal: get better OT Goal Formulation: With patient/family Time For Goal Achievement: 01/31/23 Potential to Achieve Goals: Fair  Plan      Co-evaluation    PT/OT/SLP Co-Evaluation/Treatment: Yes Reason for Co-Treatment: For patient/therapist safety;To address functional/ADL transfers PT goals addressed during session: Mobility/safety with mobility;Balance OT goals addressed during session: ADL's and self-care      AM-PAC OT "6 Clicks" Daily Activity     Outcome Measure   Help from another person eating meals?: A Little Help from another person taking care of personal grooming?: A Little Help from another person toileting, which includes using toliet, bedpan, or urinal?: Total Help from another person bathing (including washing, rinsing, drying)?: A Lot Help from another person to put on and taking off regular upper body clothing?: A Lot Help from another person to put on and taking off regular lower body clothing?: A Lot 6 Click Score: 13    End of Session Equipment Utilized During Treatment: Gait belt;Rolling walker (2 wheels)  OT Visit Diagnosis: Other abnormalities of gait and mobility (R26.89);Muscle weakness (generalized) (M62.81)   Activity Tolerance Patient tolerated treatment well   Patient Left in chair;with call bell/phone within reach;with chair alarm set;with family/visitor present   Nurse Communication Mobility status        Time: 2130-8657 OT Time Calculation (min): 24 min  Charges: OT General Charges $OT Visit: 1 Visit OT Treatments $Self Care/Home Management : 8-22 mins  Arman Filter., MPH, MS, OTR/L ascom [phone] 01/21/23, 12:40 PM

## 2023-01-21 NOTE — Consult Note (Signed)
[facility] Customer service manager [facility]) Accountable Care Organization (ACO) [facility] Liaison Note  01/21/2023  ZOEH INZER 1925-08-28 [id number]  Location: [facility] RN Hospital Liaison screened the patient remotely at [facility].  Insurance:  Medicare   WINNELL CALDERARO is a 89 y.o. female who is a Primary Care Patient of Orson Eva, NP. The patient was screened for 7 and 30 day readmission hospitalization with noted low risk score for unplanned readmission risk with 2 IP/1 ED in 6 months.  The patient was assessed for potential [facility]) Care Management service needs for post hospital transition for care coordination. Review of patient's electronic medical record reveals patient was admitted with Anasarca. Pt will be discharged to SNF level of care. Will alert PAC-RN of pt's discharged if facility is followed by the liaison. Liaison collaborated with the [employer]. Pt currently pending bed offer.  Plan: [facility] Liaison will continue to follow progress and disposition to asess for post hospital community care coordination/management needs.  Referral request for community care coordination: pending disposition.   [facility] Care Management/Population Health does not replace or interfere with any arrangements made by the Inpatient Transition of Care team.   For questions contact:   Elliot Cousin, RN, [facility] Liaison [place]   Population Health Office Hours MTWF  8:00 am-6:00 pm Off on Thursday [phone] mobile [phone] [Office toll free line] Office Hours are M-F 8:30 - 5 pm [email or web address]

## 2023-01-21 NOTE — Progress Notes (Signed)
Progress Note   Patient: Judy [name] ZOX:[id number] DOB: March 27, 1926 DOA: 01/16/2023     5 DOS: the patient was seen and examined on 01/21/2023   Brief hospital course:  Judy [name] is a 89 y.o. female with medical history significant of hypertension, hypertension, constipation admitted to the hospital with acute respiratory failure with hypoxia, secondary to community-acquired pneumonia and acute diastolic dysfunction CHF.  Patient is also malnourished and has very low albumin levels contributing to her anasarca.  She lives alone and is deconditioned.  PT has recommended SNF.   Assessment and Plan: 89 y.o. female with medical history significant of hypertension, hypertension, constipation admitted to the hospital with acute respiratory failure with hypoxia, secondary to community-acquired pneumonia and acute diastolic dysfunction CHF.  Acute respiratory failure with hypoxia [facility]):  Currently on Room air (weaned off of O2 [place]) Acute respiratory failure appears to be secondary to acute diastolic CHF and community-acquired pneumonia. Improving clinically   Acute diastolic dysfunction congestive heart failure: Improving but still has diffuse edema in extremities.  Also has anasarca component (secondary to hypoalbuminemia)  Echocardiogram with LVEF of 55 to 60% with evidence of grade 1 diastolic dysfunction Imaging showed moderate bilateral pleural effusions with dependent atelectasis Continue Lasix 40 mg IV daily.  Hold beta-blockers for now due to acute exacerbation Give Ensure Enlive 3 times daily  Anemia due to GI loss: GI consulted for anemia that has responded from 6--->9 with one unit.  no plans for EGD/Colonoscopy given advanced age, diuresis with lasix, no overt GI bleeding, and patient would not want any treatment for a malignancy if one was found Recommended palliative care/hospice evaluation as patient obviously not able to care for self at home and would qualify for  hospice Discontinue checking stool for occult blood  Continue PPI F/u H and H   Community-acquired pneumonia Leukocytosis shows a downward trend. 27.8 >> 16.4 >> 11.4 Patient was treated empirically with Rocephin and Zithromax and has been switched to Augmentin to complete a 7-day course of therapy Blood culture showed no growth in 4 days   Acute kidney injury: creatinine improved to 0.63 Monitor closely while on diuretic therapy  Thrombocytosis/leukocytosis/iron deficiency anemia Most likely secondary to an acute infectious etiology Patient noted to have a drop in her H&H 7.7 >> 6.4 No obvious bleeding.  Stool for occult blood pending. Patient was transfused 1 unit of packed RBC on 01/18/23 with improvement in her H&H Continue to hold aspirin and Lovenox until Hemoccult is resulted  Gastroesophageal reflux disease without esophagitis PPI and Carafate   Hypertension BP is stable Monitor with diuresis   Hypokalemia Secondary to diuretic therapy Replace as appropriate   Moderate malnutrition Related to chronic illness as evidenced by mild muscle depletion, moderate muscle depletion, mild to moderate fat depletion and edema. MVI with minerals daily Ensure Enlive po TID, each supplement provides 350 kcal and 20 grams of protein Magic cup TID with meals, each supplement provides 290 kcal and 9 grams of protein  Liberalize diet to regular for widest variety of meal selections   Disposition: She lives alone and is deconditioned.  PT has recommended SNF.   Subjective: Patient is seen and examined at the bedside.  Patient reported mild shortness of breath, Denies fever, chills, cough Still has swelling in legs and left hand. Denies any abdominal pain, nausea, vomiting, diarrhea  Physical Exam: Vitals:   01/20/23 1956 01/21/23 0500 01/21/23 0528 01/21/23 0819  BP: 104/66  104/64 104/62  Pulse: 84  79 82  Resp: 18  16 18   Temp: 97.8 F (36.6 C)  98.2 F (36.8 C) 98 F  (36.7 C)  TempSrc: Oral     SpO2: 96%  97% 97%  Weight:  68.9 kg     Vitals and nursing note reviewed.  Constitutional:      Appearance: Appears ill, tired HENT:     Head: Normocephalic and atraumatic.     Nose: Nose normal.     Comments: On room air    Mouth/Throat:     Mouth: Mucous membranes are moist.  Eyes:     Comments: Pale conjunctivae  Cardiovascular:     Rate and Rhythm: Normal rate and regular rhythm.  Pulmonary:     Breath sounds: Rales present at the bases  Abdominal:     General: Bowel sounds are normal.     Palpations: Abdomen is soft.  Musculoskeletal:     Cervical back: Normal range of motion and neck supple.  Noted 1+ pitting edema in extremities, left upper, left lower, right lower extremity    Right lower leg: Edema present.     Left lower leg: Edema present.  Skin:    General: Skin is warm and dry.  Neurological:     Motor: Weakness present.  Psychiatric:        Mood and Affect: Mood normal.        Behavior: Behavior normal   Data Reviewed:  Labs reviewed.  Potassium 3.3, white count 11.4, hemoglobin 9.1, platelet count 548 There are no new results to review at this time.  Family Communication: Plan of care discussed with patient at the bedside as well as her HPOA Hosie Poisson over the phone. All questions and concerns have been addressed. She verbalizes understanding and agrees with the plan.  Disposition: Status is: Inpatient Remains inpatient appropriate because: Receiving IV diuresis   Planned Discharge Destination:  TBD DVT prophylaxis with SCDs  Time spent: 33  minutes  Author: Ernestene Mention, MD 01/21/2023 8:25 AM  For on call review [email or web address].

## 2023-01-21 NOTE — [place] FL2 (Signed)
[place] MEDICAID FL2 LEVEL OF CARE FORM     IDENTIFICATION  Patient Name: Judy [name] Birthdate: 08-Nov-1925 Sex: female Admission Date (Current Location): 01/16/2023  [facility] and [place] Number:  Chiropodist and Address:  [facility], [address], [place], [place] [address]      Provider Number: [id number]  Attending Physician Name and Address:  Ernestene Mention, MD  Relative Name and Phone Number:  Hosie Poisson [phone]    Current Level of Care: Hospital Recommended Level of Care: Skilled Nursing Facility Prior Approval Number:    Date Approved/Denied:   PASRR Number: [id number]  Discharge Plan: SNF    Current Diagnoses: Patient Active Problem List   Diagnosis Date Noted   Malnutrition of moderate degree 01/20/2023   Acute respiratory failure with hypoxia (HCC) 01/16/2023   Thrombocytosis 01/16/2023   Abnormal gall bladder diagnostic imaging 01/11/2023   Lactic acidosis 01/11/2023   Leukocytosis (leucocytosis) 01/11/2023   Constipation by delayed colonic transit 01/11/2023   Sepsis (HCC) 01/10/2023   Gastroesophageal reflux disease without esophagitis 01/06/2023   Presbycusis of both ears 08/02/2022   Arthritis 10/14/2021   Heart murmur 10/14/2021   Hyperlipidemia 10/14/2021   Shingles 10/14/2021   Subdural hematoma (HCC) 11/12/2019   Hypertension 08/09/2019   Stage 3a chronic kidney disease (HCC) 08/09/2019   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    Orientation RESPIRATION BLADDER Height & Weight     Self, Time, Situation, Place  Normal Continent Weight: 68.9 kg Height:     BEHAVIORAL SYMPTOMS/MOOD NEUROLOGICAL BOWEL NUTRITION STATUS      Incontinent  (See Discharge Summary)  AMBULATORY STATUS COMMUNICATION OF NEEDS Skin   Extensive Assist Verbally Normal                       Personal Care Assistance Level of Assistance  Bathing, Feeding, Dressing Bathing Assistance: Maximum  assistance Feeding assistance: Limited assistance Dressing Assistance: Maximum assistance     Functional Limitations Info  Sight, Hearing, Speech Sight Info: Adequate Hearing Info: Adequate Speech Info: Adequate    SPECIAL CARE FACTORS FREQUENCY  PT (By licensed PT), OT (By licensed OT)     PT Frequency: 5x weekly OT Frequency: 5x Weekly            Contractures Contractures Info: Not present    Additional Factors Info  Code Status, Allergies Code Status Info: DNR Allergies Info: No Known Allergies           Current Medications (01/21/2023):  This is the current hospital active medication list Current Facility-Administered Medications  Medication Dose Route Frequency Provider Last Rate Last Admin   amoxicillin-clavulanate (AUGMENTIN) 400-57 MG/5ML suspension 872 mg  875 mg of amoxicillin Oral Q12H Meegan, Eryn, RPH   872 mg at 01/21/23 1021   feeding supplement (ENSURE ENLIVE / ENSURE PLUS) liquid 237 mL  237 mL Oral TID BM Agbata, Tochukwu, MD   237 mL at 01/21/23 1337   ferrous sulfate tablet 325 mg  325 mg Oral Q breakfast Agbata, Tochukwu, MD   325 mg at 01/21/23 0839   furosemide (LASIX) injection 40 mg  40 mg Intravenous Daily Agbata, Tochukwu, MD   40 mg at 01/21/23 0840   ipratropium-albuterol (DUONEB) 0.5-2.5 (3) MG/3ML nebulizer solution 3 mL  3 mL Nebulization Q6H PRN Agbata, Tochukwu, MD   3 mL at 01/19/23 0914   multivitamin with minerals tablet 1 tablet  1 tablet Oral Daily Agbata, Tochukwu,  MD   1 tablet at 01/21/23 0839   ondansetron (ZOFRAN) injection 4 mg  4 mg Intravenous Q6H PRN Floydene Flock, MD   4 mg at 01/17/23 1638   pantoprazole (PROTONIX) EC tablet 40 mg  40 mg Oral Daily Floydene Flock, MD   40 mg at 01/21/23 0839   polyethylene glycol (MIRALAX / GLYCOLAX) packet 17 g  17 g Oral Daily Agbata, Tochukwu, MD   17 g at 01/21/23 0840   potassium chloride SA (KLOR-CON M) CR tablet 40 mEq  40 mEq Oral Daily Agbata, Tochukwu, MD   40 mEq at 01/21/23  0839   senna (SENOKOT) tablet 8.6 mg  1 tablet Oral BID Floydene Flock, MD   8.6 mg at 01/21/23 0839   sucralfate (CARAFATE) 1 GM/10ML suspension 1 g  1 g Oral TID WC & HS Floydene Flock, MD   1 g at 01/21/23 1207   traZODone (DESYREL) tablet 25 mg  25 mg Oral QHS PRN Ernestene Mention, MD         Discharge Medications: Please see discharge summary for a list of discharge medications.  Relevant Imaging Results:  Relevant Lab Results:   Additional Information [id number]  Garret Reddish, RN

## 2023-01-22 LAB — URINALYSIS, ROUTINE W REFLEX MICROSCOPIC
Bilirubin Urine: NEGATIVE
Glucose, UA: NEGATIVE mg/dL
Ketones, ur: NEGATIVE mg/dL
Leukocytes,Ua: NEGATIVE
Nitrite: NEGATIVE
Protein, ur: NEGATIVE mg/dL
Specific Gravity, Urine: 1.006 (ref 1.005–1.030)
pH: 7 (ref 5.0–8.0)

## 2023-01-22 MED ORDER — OXYCODONE-ACETAMINOPHEN 5-325 MG PO TABS
1.0000 | ORAL_TABLET | Freq: Four times a day (QID) | ORAL | Status: DC | PRN
  Administered 2023-01-22 – 2023-01-23 (×2): 1 via ORAL
  Filled 2023-01-22 (×3): qty 1

## 2023-01-22 MED ORDER — ACETAMINOPHEN 325 MG PO TABS
650.0000 mg | ORAL_TABLET | Freq: Once | ORAL | Status: AC
  Administered 2023-01-22: 650 mg via ORAL
  Filled 2023-01-22: qty 2

## 2023-01-22 MED ORDER — FUROSEMIDE 40 MG PO TABS
40.0000 mg | ORAL_TABLET | Freq: Every day | ORAL | Status: DC
  Administered 2023-01-23 – 2023-01-25 (×3): 40 mg via ORAL
  Filled 2023-01-22 (×3): qty 1

## 2023-01-22 MED ORDER — PHENAZOPYRIDINE HCL 100 MG PO TABS
100.0000 mg | ORAL_TABLET | Freq: Three times a day (TID) | ORAL | Status: AC
  Administered 2023-01-22 – 2023-01-24 (×6): 100 mg via ORAL
  Filled 2023-01-22 (×6): qty 1

## 2023-01-22 NOTE — Plan of Care (Signed)
  Problem: Fluid Volume: Goal: Hemodynamic stability will improve Outcome: Progressing   Problem: Clinical Measurements: Goal: Diagnostic test results will improve Outcome: Progressing Goal: Signs and symptoms of infection will decrease Outcome: Progressing   Problem: Respiratory: Goal: Ability to maintain adequate ventilation will improve Outcome: Progressing   Problem: Education: Goal: Ability to demonstrate management of disease process will improve Outcome: Progressing Goal: Ability to verbalize understanding of medication therapies will improve Outcome: Progressing Goal: Individualized Educational Video(s) Outcome: Progressing   Problem: Activity: Goal: Capacity to carry out activities will improve Outcome: Progressing   Problem: Cardiac: Goal: Ability to achieve and maintain adequate cardiopulmonary perfusion will improve Outcome: Progressing   Problem: Activity: Goal: Ability to tolerate increased activity will improve Outcome: Progressing   Problem: Clinical Measurements: Goal: Ability to maintain a body temperature in the normal range will improve Outcome: Progressing   Problem: Respiratory: Goal: Ability to maintain adequate ventilation will improve Outcome: Progressing Goal: Ability to maintain a clear airway will improve Outcome: Progressing   Problem: Education: Goal: Knowledge of General Education information will improve Description: Including pain rating scale, medication(s)/side effects and non-pharmacologic comfort measures Outcome: Progressing   Problem: Health Behavior/Discharge Planning: Goal: Ability to manage health-related needs will improve Outcome: Progressing   Problem: Clinical Measurements: Goal: Ability to maintain clinical measurements within normal limits will improve Outcome: Progressing Goal: Will remain free from infection Outcome: Progressing Goal: Diagnostic test results will improve Outcome: Progressing Goal:  Respiratory complications will improve Outcome: Progressing Goal: Cardiovascular complication will be avoided Outcome: Progressing   Problem: Activity: Goal: Risk for activity intolerance will decrease Outcome: Progressing   Problem: Nutrition: Goal: Adequate nutrition will be maintained Outcome: Progressing   Problem: Coping: Goal: Level of anxiety will decrease Outcome: Progressing   Problem: Elimination: Goal: Will not experience complications related to bowel motility Outcome: Progressing Goal: Will not experience complications related to urinary retention Outcome: Progressing   Problem: Pain Managment: Goal: General experience of comfort will improve Outcome: Progressing   Problem: Safety: Goal: Ability to remain free from injury will improve Outcome: Progressing   Problem: Skin Integrity: Goal: Risk for impaired skin integrity will decrease Outcome: Progressing

## 2023-01-22 NOTE — Progress Notes (Addendum)
Progress Note   Patient: Judy [name]:[id number] DOB: Feb 04, 1926 DOA: 01/16/2023     6 DOS: the patient was seen and examined on 01/22/2023   Brief hospital course:  Judy [name] is a 89 y.o. female with medical history significant of hypertension, hypertension, constipation admitted to the hospital with acute respiratory failure with hypoxia, secondary to community-acquired pneumonia and acute diastolic dysfunction CHF.  Patient is also malnourished and has very low albumin levels contributing to her anasarca.  She lives alone and is deconditioned.  PT has recommended SNF.   Assessment and Plan: 89 y.o. female with medical history significant of hypertension, hypertension, constipation admitted to the hospital with acute respiratory failure with hypoxia, secondary to community-acquired pneumonia and acute diastolic dysfunction CHF.  Upper and lower Abdominal pain likely from 2/2 Bladder spasms & ?PUD/Gastritis Give trial of Pyridium TID Obtain UA Place external cath for incontinence Continue PPI as above  Acute diastolic dysfunction congestive heart failure: Improving but still has diffuse edema in extremities.  Also has anasarca component (secondary to hypoalbuminemia) Echocardiogram with LVEF of 55 to 60% with evidence of grade 1 diastolic dysfunction Imaging showed moderate bilateral pleural effusions with dependent atelectasis Change Lasix 40 mg IV daily to Lasix PO 40mg   Hold beta-blockers for now due to acute exacerbation Give Ensure Enlive 3 times daily  Acute respiratory failure with hypoxia [facility]):  Currently on Room air (weaned off of O2 [place]) Acute respiratory failure appears to be secondary to acute diastolic CHF and community-acquired pneumonia. Improving clinically  Anemia due to GI loss: GI consulted. Hb improved from 6--->10.7 As per GI, no plans for EGD/Colonoscopy given advanced age, diuresis with lasix, no overt GI bleeding, and patient would not want  any treatment for a malignancy if one was found Recommended palliative care/hospice evaluation as patient obviously not able to care for self at home and would qualify for hospice Discontinued checking stool for occult blood  Continue PPI F/u CBC   Community-acquired pneumonia Leukocytosis improving and stable: 27.8 >> 16.4 >> 11.4--> 11.7 Patient was treated empirically with Rocephin and Zithromax and has been switched to Augmentin to complete a 7-day course of therapy Blood culture showed no growth in 4 days   Acute kidney injury: creatinine improved to 0.63 Monitor closely while on diuretic therapy  Thrombocytosis/leukocytosis/iron deficiency anemia Most likely secondary to an acute infectious etiology No obvious bleeding.  Patient was transfused 1 unit of packed RBC on 01/18/23 with improvement in her H&H Continue to hold aspirin and Lovenox until Hemoccult is resulted  Gastroesophageal reflux disease without esophagitis PPI and Carafate   Hypertension BP is stable Monitor with diuresis   Hypokalemia Secondary to diuretic therapy Replace as appropriate   Moderate malnutrition Related to chronic illness as evidenced by mild muscle depletion, moderate muscle depletion, mild to moderate fat depletion and edema. MVI with minerals daily Ensure Enlive po TID, each supplement provides 350 kcal and 20 grams of protein Magic cup TID with meals, each supplement provides 290 kcal and 9 grams of protein  Liberalize diet to regular for widest variety of meal selections   Disposition: She lives alone and is deconditioned.  PT has recommended SNF.   Subjective: Patient is seen and examined at the bedside.  Patient reported both upper ad lower abdominal pain, pain is primarily when she urinates and reported passing stools, Denies fever, chills, cough Swelling in legs and left hand improving Denies any abdominal pain, nausea, vomiting, diarrhea  Physical Exam: Vitals:  01/21/23  2125 01/22/23 0412 01/22/23 0436 01/22/23 0939  BP: 94/75 (!) 96/59  134/72  Pulse: 86 67  77  Resp: 16 20  18   Temp: 98.1 F (36.7 C) 98 F (36.7 C)  98.7 F (37.1 C)  TempSrc: Oral Axillary  Axillary  SpO2: 98% 96%  98%  Weight:   65.1 kg    Vitals and nursing note reviewed.  Constitutional:      Appearance: Appears ill, tired HENT:     Head: Normocephalic and atraumatic.     Nose: Nose normal.     Comments: On room air    Mouth/Throat:     Mouth: Mucous membranes are moist.  Eyes:     Comments: no icterus/pallor Cardiovascular:     Rate and Rhythm: Normal rate and regular rhythm.  Pulmonary:     Breath sounds: Rales present at the bases  Abdominal:     General: Bowel sounds are normal.     Palpations: Abdomen is soft.  Musculoskeletal: pitting edema in left upper, left lower, right lower extremities improving Skin:    General: Skin is warm and dry.  Neurological:     Motor: Weakness present.  Psychiatric:        Mood and Affect: Mood normal.        Behavior: Behavior normal   Data Reviewed:  Latest Reference Range & Units 01/21/23 10:12  Sodium 135 - 145 mmol/L 137  Potassium 3.5 - 5.1 mmol/L 3.8  Chloride 98 - 111 mmol/L 103  CO2 22 - 32 mmol/L 25  Glucose 70 - 99 mg/dL 97  BUN 8 - 23 mg/dL 14  Creatinine 5.28 - 4.13 mg/dL 2.44  Calcium 8.9 - 01.0 mg/dL 8.1 (L)  Anion gap 5 - 15  9  GFR, Estimated >60 mL/min >60  WBC 4.0 - 10.5 K/uL 11.7 (H)  RBC 3.87 - 5.11 MIL/uL 3.82 (L)  Hemoglobin 12.0 - 15.0 g/dL 27.2 (L)  HCT 53.6 - 64.4 % 33.3 (L)  MCV 80.0 - 100.0 fL 87.2  MCH 26.0 - 34.0 pg 28.0  MCHC 30.0 - 36.0 g/dL 03.4  RDW 74.2 - 59.5 % 15.8 (H)  Platelets 150 - 400 K/uL 729 (H)  nRBC 0.0 - 0.2 % 0.0  (L): Data is abnormally low (H): Data is abnormally high  Family Communication: Plan of care discussed with patient at the bedside as well as her HPOA Hosie Poisson over the phone. All questions and concerns have been addressed. She verbalizes  understanding and agrees with the plan.  Disposition: Status is: Inpatient  Planned Discharge Destination: Home with Home health v/s SNF , case management following  DVT prophylaxis with SCDs  Time spent: 33  minutes  Author: Ernestene Mention, MD 01/22/2023 11:31 AM  For on call review [email or web address].

## 2023-01-22 NOTE — TOC Progression Note (Signed)
Transition of Care [facility]) - Progression Note    Patient Details  Name: Judy [name] MRN: [id number] Date of Birth: [birth year]  Transition of Care [facility]) CM/SW Contact  Allena Katz, LCSW Phone Number: 01/22/2023, 9:29 AM  Clinical Narrative:   CSW spoke with pt HCPOA leslie who reports that if pt is to go to SNF she would like for patient to go to [facility] or [facility]. Pt has acceptance at [facility] but not at [facility] at this time. CSW will reach out to andrea with [facility] to see if she is able to take her. If not leslie wants to take pt home with [facility] services and would need assistance with this. [name] states that she wants to make sure pt's stomach pain is resolved first.     Expected Discharge Plan:  (Pending Medical work up) Barriers to Discharge: Continued Medical Work up  Expected Discharge Plan and Services   Discharge Planning Services: CM Consult Post Acute Care Choice:  (Home with Home health v/s SNF) Living arrangements for the past 2 months: Single Family Home                                       Social Determinants of Health (SDOH) Interventions SDOH Screenings   Food Insecurity: No Food Insecurity (01/10/2023)  Housing: Low Risk  (01/10/2023)  Transportation Needs: No Transportation Needs (01/10/2023)  Utilities: Not At Risk (01/10/2023)  Social Connections: Unknown (10/27/2021)   Received from [facility]  Tobacco Use: Low Risk  (01/16/2023)    Readmission Risk Interventions    01/20/2023    9:57 AM  Readmission Risk Prevention Plan  Transportation Screening Complete  PCP or Specialist Appt within 5-7 Days Complete  Home Care Screening Complete  Medication Review (RN CM) Complete

## 2023-01-22 NOTE — Plan of Care (Signed)
  Problem: Fluid Volume: Goal: Hemodynamic stability will improve Outcome: Progressing   Problem: Clinical Measurements: Goal: Diagnostic test results will improve Outcome: Progressing   Problem: Respiratory: Goal: Ability to maintain adequate ventilation will improve Outcome: Progressing   Problem: Education: Goal: Ability to demonstrate management of disease process will improve Outcome: Progressing   Problem: Activity: Goal: Capacity to carry out activities will improve Outcome: Progressing   Problem: Cardiac: Goal: Ability to achieve and maintain adequate cardiopulmonary perfusion will improve Outcome: Progressing   Problem: Clinical Measurements: Goal: Ability to maintain a body temperature in the normal range will improve Outcome: Progressing   Problem: Respiratory: Goal: Ability to maintain adequate ventilation will improve Outcome: Progressing   Problem: Activity: Goal: Risk for activity intolerance will decrease Outcome: Progressing

## 2023-01-23 DIAGNOSIS — J9601 Acute respiratory failure with hypoxia: Secondary | ICD-10-CM | POA: Diagnosis not present

## 2023-01-23 LAB — CBC
HCT: 25.8 % — ABNORMAL LOW (ref 36.0–46.0)
Hemoglobin: 8.3 g/dL — ABNORMAL LOW (ref 12.0–15.0)
MCH: 28 pg (ref 26.0–34.0)
MCHC: 32.2 g/dL (ref 30.0–36.0)
MCV: 87.2 fL (ref 80.0–100.0)
Platelets: 491 10*3/uL — ABNORMAL HIGH (ref 150–400)
RBC: 2.96 MIL/uL — ABNORMAL LOW (ref 3.87–5.11)
RDW: 15.4 % (ref 11.5–15.5)
WBC: 9.3 10*3/uL (ref 4.0–10.5)
nRBC: 0 % (ref 0.0–0.2)

## 2023-01-23 MED ORDER — LORATADINE 10 MG PO TABS
10.0000 mg | ORAL_TABLET | Freq: Every day | ORAL | Status: DC
  Administered 2023-01-23 – 2023-01-25 (×3): 10 mg via ORAL
  Filled 2023-01-23 (×3): qty 1

## 2023-01-23 MED ORDER — VITAMIN E 180 MG (400 UNIT) PO CAPS
400.0000 [IU] | ORAL_CAPSULE | Freq: Every day | ORAL | Status: DC
  Administered 2023-01-23 – 2023-01-25 (×3): 400 [IU] via ORAL
  Filled 2023-01-23 (×3): qty 1

## 2023-01-23 MED ORDER — CALCIUM CARBONATE 1250 (500 CA) MG PO TABS
1.0000 | ORAL_TABLET | Freq: Every day | ORAL | Status: DC
  Administered 2023-01-23 – 2023-01-24 (×2): 1250 mg via ORAL
  Filled 2023-01-23 (×2): qty 1

## 2023-01-23 MED ORDER — DOCUSATE SODIUM 100 MG PO CAPS
100.0000 mg | ORAL_CAPSULE | Freq: Two times a day (BID) | ORAL | Status: DC
  Administered 2023-01-23 – 2023-01-25 (×5): 100 mg via ORAL
  Filled 2023-01-23 (×5): qty 1

## 2023-01-23 MED ORDER — OXYBUTYNIN CHLORIDE 5 MG PO TABS
5.0000 mg | ORAL_TABLET | Freq: Two times a day (BID) | ORAL | Status: DC
  Administered 2023-01-23 – 2023-01-25 (×5): 5 mg via ORAL
  Filled 2023-01-23 (×5): qty 1

## 2023-01-23 MED ORDER — BISACODYL 5 MG PO TBEC: 5.0000 mg | DELAYED_RELEASE_TABLET | Freq: Every day | ORAL | Status: DC | PRN

## 2023-01-23 MED ORDER — VITAMIN D 25 MCG (1000 UNIT) PO TABS
1000.0000 [IU] | ORAL_TABLET | Freq: Every day | ORAL | Status: DC
  Administered 2023-01-23 – 2023-01-25 (×3): 1000 [IU] via ORAL
  Filled 2023-01-23 (×3): qty 1

## 2023-01-23 NOTE — Progress Notes (Signed)
[facility]  -  at [facility]   PATIENT NAME: Judy [name]    MR#:  [id number]  DATE OF BIRTH:  07-21-1925  SUBJECTIVE:      VITALS:  Blood pressure 108/71, pulse 81, temperature 98 F (36.7 C), resp. rate 16, weight 66.7 kg, SpO2 94%.  PHYSICAL EXAMINATION:   GENERAL:  89 y.o.-year-old patient with no acute distress.  LUNGS: Normal breath sounds bilaterally, no wheezing CARDIOVASCULAR: S1, S2 normal. No murmur   ABDOMEN: Soft, nontender, nondistended. Bowel sounds present.  EXTREMITIES: No  edema b/l.    NEUROLOGIC: nonfocal  patient is alert and awake SKIN: No obvious rash, lesion, or ulcer.   LABORATORY PANEL:  CBC Recent Labs  Lab 01/23/23 0412  WBC 9.3  HGB 8.3*  HCT 25.8*  PLT 491*    Chemistries  Recent Labs  Lab 01/17/23 0502 01/18/23 0521 01/21/23 1012  NA 137   < > 137  K 3.7   < > 3.8  CL 110   < > 103  CO2 17*   < > 25  GLUCOSE 108*   < > 97  BUN 46*   < > 14  CREATININE 1.32*   < > 0.63  CALCIUM 7.7*   < > 8.1*  AST 14*  --   --   ALT 15  --   --   ALKPHOS 52  --   --   BILITOT 0.7  --   --    < > = values in this interval not displayed.   Assessment and Plan   Judy [name] is a 89 y.o. female with medical history significant of hypertension, hypertension, constipation admitted to the hospital with acute respiratory failure with hypoxia, secondary to community-acquired pneumonia and acute diastolic dysfunction CHF.  Patient is also malnourished and has very low albumin levels contributing to her anasarca.  She lives alone and is deconditioned.  PT has recommended SNF.      Acute diastolic dysfunction congestive heart failure: Improving but still has diffuse edema in extremities.  Also has anasarca component (secondary to hypoalbuminemia) --Echocardiogram with LVEF of 55 to 60% with evidence of grade 1 diastolic dysfunction Imaging showed moderate bilateral pleural effusions with dependent  atelectasis --Change Lasix 40 mg IV daily to Lasix PO 40mg   --Hold beta-blockers for now due to acute exacerbation --Give Ensure Enlive 3 times daily   Acute respiratory failure with hypoxia [facility]):  --Currently on Room air (weaned off of O2 [place]) --Acute respiratory failure appears to be secondary to acute diastolic CHF and community-acquired pneumonia. --Improving clinically--on RA sats 94%   Anemia due to GI loss: GI consulted. --Hb improved from 6--->10.7 --As per GI, no plans for EGD/Colonoscopy given advanced age, diuresis with lasix, no overt GI bleeding, and patient would not want any treatment for a malignancy if one was found --Recommended palliative care/hospice evaluation as patient obviously not able to care for self at home and would qualify for hospice --Continue PPI   Community-acquired pneumonia --Leukocytosis improving and stable: 27.8 >> 16.4 >> 11.4--> 11.7 --Patient was treated empirically with Rocephin and Zithromax and has been switched to --Augmentin to complete a 7-day course of therapy --Blood culture showed no growth in 4 days   Acute kidney injury: creatinine improved to 0.63 Monitor closely while on diuretic therapy   Thrombocytosis/leukocytosis/iron deficiency anemia --Most likely secondary to an acute infectious etiology --No obvious bleeding.  --Patient was transfused 1 unit of packed RBC on 01/18/23 with improvement  in her H&H --Continue to hold aspirin and Lovenox   Gastroesophageal reflux disease without esophagitis PPI and Carafate   Hypertension BP is stable   Hypokalemia Secondary to diuretic therapy Replace as appropriate   Moderate malnutrition Related to chronic illness as evidenced by mild muscle depletion, moderate muscle depletion, mild to moderate fat depletion and edema. MVI with minerals daily Ensure Enlive po TID, each supplement provides 350 kcal and 20 grams of protein Magic cup TID with meals, each supplement provides 290  kcal and 9 grams of protein  Liberalize diet to regular for widest variety of meal selections    overall improving. TOC for discharge planning to rehab.  Procedures: Family communication :Ubaldo Glassing Consults :none CODE STATUS: DNR DVT Prophylaxis :SCD Level of care: Med-Surg Status is: Inpatient Remains inpatient appropriate because: throwing. Patient is at baseline. TOC for discharge planning to rehab.    TOTAL TIME TAKING CARE OF THIS PATIENT: 35 minutes.  >50% time spent on counselling and coordination of care  Note: This dictation was prepared with Dragon dictation along with smaller phrase technology. Any transcriptional errors that result from this process are unintentional.  Enedina Finner M.D    [facility]   CC: Primary care physician; Orson Eva, NP

## 2023-01-23 NOTE — Plan of Care (Signed)
  Problem: Fluid Volume: Goal: Hemodynamic stability will improve Outcome: Progressing   Problem: Clinical Measurements: Goal: Diagnostic test results will improve Outcome: Progressing Goal: Signs and symptoms of infection will decrease Outcome: Progressing   Problem: Respiratory: Goal: Ability to maintain adequate ventilation will improve Outcome: Progressing   Problem: Education: Goal: Ability to demonstrate management of disease process will improve Outcome: Progressing Goal: Ability to verbalize understanding of medication therapies will improve Outcome: Progressing Goal: Individualized Educational Video(s) Outcome: Progressing   Problem: Activity: Goal: Capacity to carry out activities will improve Outcome: Progressing   Problem: Cardiac: Goal: Ability to achieve and maintain adequate cardiopulmonary perfusion will improve Outcome: Progressing   Problem: Activity: Goal: Ability to tolerate increased activity will improve Outcome: Progressing   Problem: Clinical Measurements: Goal: Ability to maintain a body temperature in the normal range will improve Outcome: Progressing   Problem: Respiratory: Goal: Ability to maintain adequate ventilation will improve Outcome: Progressing Goal: Ability to maintain a clear airway will improve Outcome: Progressing   Problem: Education: Goal: Knowledge of General Education information will improve Description: Including pain rating scale, medication(s)/side effects and non-pharmacologic comfort measures Outcome: Progressing   Problem: Health Behavior/Discharge Planning: Goal: Ability to manage health-related needs will improve Outcome: Progressing   Problem: Clinical Measurements: Goal: Ability to maintain clinical measurements within normal limits will improve Outcome: Progressing Goal: Will remain free from infection Outcome: Progressing Goal: Diagnostic test results will improve Outcome: Progressing Goal:  Respiratory complications will improve Outcome: Progressing Goal: Cardiovascular complication will be avoided Outcome: Progressing   Problem: Activity: Goal: Risk for activity intolerance will decrease Outcome: Progressing   Problem: Nutrition: Goal: Adequate nutrition will be maintained Outcome: Progressing   Problem: Coping: Goal: Level of anxiety will decrease Outcome: Progressing   Problem: Elimination: Goal: Will not experience complications related to bowel motility Outcome: Progressing Goal: Will not experience complications related to urinary retention Outcome: Progressing   Problem: Pain Managment: Goal: General experience of comfort will improve Outcome: Progressing   Problem: Safety: Goal: Ability to remain free from injury will improve Outcome: Progressing   Problem: Skin Integrity: Goal: Risk for impaired skin integrity will decrease Outcome: Progressing

## 2023-01-24 DIAGNOSIS — J9601 Acute respiratory failure with hypoxia: Secondary | ICD-10-CM | POA: Diagnosis not present

## 2023-01-24 NOTE — Progress Notes (Signed)
Physical Therapy Treatment Patient Details Name: Judy [name] MRN: [id number] DOB: 07-11-1925 Today's Date: 01/24/2023   History of Present Illness 89 y.o. female with medical history significant of HTN, HLD, constipation, GERD, and stage 3A CKD presenting with acute respiratory failure with hypoxia, pneumonia, volume overload. Recently admitted 7/29-7/30 for lactic acidosis and severe constipation.    PT Comments  Pt is making steady progress towards PT goals. Pt received in bed, she is agreeable to PT session. Pt performs bed mobility and transfers with max A x2 for safety. Pt able to follow one word commands and initiate bed mobility but is limited by general BLE weakness and reports R hip discomfort. Able to sit EOB with feet support and BUE support but cont to demonstrate fw/slouched posture and cuing to correct. Pt cont to demonstrate BLE weakness and requires facilitation at hips for weight shifting to advance BLE during transfers. Frequent cuing required for hand placement on RW. Pt in recliner at the end of session. Pt would cont to benefit from skilled PT to address above deficits and promote optimal return to PLOF.     If plan is discharge home, recommend the following: Two people to help with walking and/or transfers;Two people to help with bathing/dressing/bathroom;Help with stairs or ramp for entrance;Assist for transportation;Assistance with cooking/housework   Can travel by private vehicle     No  Equipment Recommendations  None recommended by PT    Recommendations for Other Services       Precautions / Restrictions Precautions Precautions: Fall Restrictions Weight Bearing Restrictions: No     Mobility  Bed Mobility Overal bed mobility: Needs Assistance Bed Mobility: Supine to Sit     Supine to sit: Max assist, +2 for physical assistance Sit to supine: +2 for physical assistance, Max assist   General bed mobility comments: Cont to demonstrate slight  improvement with initiation of bed mobility but cont to require max A x2 for facilitation of BLE to sit EOB, and cuing for sequencing/hand placement.    Transfers Overall transfer level: Needs assistance Equipment used: Rolling walker (2 wheels) Transfers: Sit to/from Stand, Bed to chair/wheelchair/BSC Sit to Stand: Max assist, +2 physical assistance   Step pivot transfers: Max assist, +2 physical assistance       General transfer comment: STS x1 max A with elevated surface. max A x2 for step pivot transfer to recliner with facilitation at hips and weight shift to assist with LE advancement. Cuing for hand placement, forward lean, and upright posture.    Ambulation/Gait               General Gait Details: NT due to poor activity tolerance   Stairs             Wheelchair Mobility     Tilt Bed    Modified Rankin (Stroke Patients Only)       Balance Overall balance assessment: Needs assistance Sitting-balance support: Bilateral upper extremity supported, Feet supported Sitting balance-Leahy Scale: Fair Sitting balance - Comments: Able to maintain EOB seated balance but required BUE support. Demonstrates fw/slouch posture Postural control: Other (comment) (fw/slouch posture) Standing balance support: Reliant on assistive device for balance, During functional activity, Bilateral upper extremity supported Standing balance-Leahy Scale: Fair Standing balance comment: forward flex posture requiring cuing to correct. Cues for hand placement on RW for safety.  Cognition Arousal: Alert Behavior During Therapy: WFL for tasks assessed/performed Overall Cognitive Status: Impaired/Different from baseline Area of Impairment: Following commands, Safety/judgement, Problem solving                       Following Commands: Follows one step commands with increased time Safety/Judgement: Decreased awareness of deficits   Problem  Solving: Slow processing, Decreased initiation, Difficulty sequencing, Requires verbal cues, Requires tactile cues General Comments: Delayed processing and facilitation required for hand placements for bed mobility and transfer        Exercises      General Comments        Pertinent Vitals/Pain Pain Assessment Pain Assessment: Faces Faces Pain Scale: Hurts a little bit Pain Location: R hip Pain Descriptors / Indicators: Grimacing, Moaning Pain Intervention(s): Monitored during session, Limited activity within patient's tolerance    Home Living                          Prior Function            PT Goals (current goals can now be found in the care plan section) Acute Rehab PT Goals Patient Stated Goal: unable to state PT Goal Formulation: With patient Time For Goal Achievement: 01/31/23 Potential to Achieve Goals: Fair Progress towards PT goals: Progressing toward goals    Frequency    Min 1X/week      PT Plan Current plan remains appropriate    Co-evaluation     PT goals addressed during session: Mobility/safety with mobility;Balance        AM-PAC PT "6 Clicks" Mobility   Outcome Measure  Help needed turning from your back to your side while in a flat bed without using bedrails?: A Lot Help needed moving from lying on your back to sitting on the side of a flat bed without using bedrails?: A Lot Help needed moving to and from a bed to a chair (including a wheelchair)?: A Lot Help needed standing up from a chair using your arms (e.g., wheelchair or bedside chair)?: Total Help needed to walk in hospital room?: Total Help needed climbing 3-5 steps with a railing? : Total 6 Click Score: 9    End of Session Equipment Utilized During Treatment: Gait belt Activity Tolerance: Patient limited by fatigue;Patient limited by pain Patient left: in chair;with chair alarm set;with family/visitor present Nurse Communication: Mobility status PT Visit  Diagnosis: Other abnormalities of gait and mobility (R26.89);Difficulty in walking, not elsewhere classified (R26.2);Muscle weakness (generalized) (M62.81)     Time: 6387-5643 PT Time Calculation (min) (ACUTE ONLY): 19 min  Charges:    $Therapeutic Activity: 8-22 mins PT General Charges $$ ACUTE PT VISIT: 1 Visit                     Elmon Else, SPT      01/24/2023, 12:21 PM

## 2023-01-24 NOTE — Progress Notes (Signed)
[facility]  - [place] [facility]   PATIENT NAME: Judy [name]    MR#:  [id number]  DATE OF BIRTH:  10-08-1925  SUBJECTIVE:  Pt's friend at bedside Sitting out int he chair. No issues per RN Appears tired.    VITALS:  Blood pressure 120/63, pulse 80, temperature (!) 97.2 F (36.2 C), resp. rate 15, weight 62.4 kg, SpO2 96%.  PHYSICAL EXAMINATION:   GENERAL:  89 y.o.-year-old patient with no acute distress. weak LUNGS: Normal breath sounds bilaterally, no wheezing CARDIOVASCULAR: S1, S2 normal. No murmur   ABDOMEN: Soft, nontender, nondistended. Bowel sounds present.  EXTREMITIES: No  edema b/l.    NEUROLOGIC: nonfocal  patient is alert and awake pleasant confusion  LABORATORY PANEL:  CBC Recent Labs  Lab 01/23/23 0412  WBC 9.3  HGB 8.3*  HCT 25.8*  PLT 491*    Chemistries  Recent Labs  Lab 01/21/23 1012  NA 137  K 3.8  CL 103  CO2 25  GLUCOSE 97  BUN 14  CREATININE 0.63  CALCIUM 8.1*   Assessment and Plan   Judy [name] is a 89 y.o. female with medical history significant of hypertension, hypertension, constipation admitted to the [name] with acute respiratory failure with hypoxia, secondary to community-acquired pneumonia and acute diastolic dysfunction CHF.  Patient is also malnourished and has very low albumin levels contributing to her anasarca.  She lives alone and is deconditioned.  PT has recommended SNF.      Acute diastolic dysfunction congestive heart failure: Improving but still has diffuse edema in extremities.  Also has anasarca component (secondary to hypoalbuminemia) --Echocardiogram with LVEF of 55 to 60% with evidence of grade 1 diastolic dysfunction Imaging showed moderate bilateral pleural effusions with dependent atelectasis --Change Lasix 40 mg IV daily to Lasix PO 40mg     Acute respiratory failure with hypoxia Judy [name]):  --Currently on Room air (weaned off of O2 [place]) --Acute respiratory failure appears  to be secondary to acute diastolic CHF and community-acquired pneumonia. --Improving clinically--on RA sats 94%   Anemia due to GI loss: GI consulted. --Hb improved from 6--->10.7 --As per GI, no plans for EGD/Colonoscopy given advanced age, diuresis with lasix, no overt GI bleeding, and patient would not want any treatment for a malignancy if one was found --Recommended palliative care/hospice evaluation as patient obviously not able to care for self at home and would qualify for hospice --Continue PPI   Community-acquired pneumonia --Leukocytosis improving and stable: 27.8 >> 16.4 >> 11.4--> 11.7--9.3 --Patient was treated empirically with Rocephin and Zithromax and has been switched to --Augmentin to complete a 7-day course of therapy --Blood culture showed no growth in 4 days   Acute kidney injury: creatinine improved to 0.63 --came in with creat 1.63   Thrombocytosis/leukocytosis/iron deficiency anemia --Most likely secondary to an acute infectious etiology --No obvious bleeding.  --Patient was transfused 1 unit of packed RBC on 01/18/23 with improvement in her H&H --Continue to hold aspirin and Lovenox   Gastroesophageal reflux disease without esophagitis PPI and Carafate   Hypertension BP is stable   Hypokalemia Secondary to diuretic therapy Replace as appropriate   Moderate malnutrition Related to chronic illness as evidenced by mild muscle depletion, moderate muscle depletion, mild to moderate fat depletion and edema. MVI with minerals daily Ensure Enlive po TID, each supplement provides 350 kcal and 20 grams of protein Magic cup TID with meals, each supplement provides 290 kcal and 9 grams of protein  Liberalize diet to regular  for widest variety of meal selections    overall improving. TOC for discharge planning to rehab.  Procedures: Family communication :Ubaldo Glassing Consults :none CODE STATUS: DNR DVT Prophylaxis :SCD Level of care: Med-Surg Status is:  Inpatient Remains inpatient appropriate because: throwing. Patient is at baseline. TOC for discharge planning to rehab.  Per TOC per can d/c to [facility] on 01/24/23  TOTAL TIME TAKING CARE OF THIS PATIENT: 35 minutes.  >50% time spent on counselling and coordination of care  Note: This dictation was prepared with Dragon dictation along with smaller phrase technology. Any transcriptional errors that result from this process are unintentional.  Enedina Finner M.D    [facility]   CC: Primary care physician; Orson Eva, NP

## 2023-01-24 NOTE — Care Management Important Message (Signed)
Important Message  Patient Details  Name: Judy [name] MRN: [id number] Date of Birth: July 08, 1925   Medicare Important Message Given:  Yes  I reviewed the Important Message from Medicare with the patient's POA, Hosie Poisson by phone [phone]) and she is in agreement with the discharge. I thanked her for her time.    Olegario Messier A  01/24/2023, 3:17 PM

## 2023-01-24 NOTE — TOC Progression Note (Signed)
Transition of Care [facility]) - Progression Note    Patient Details  Name: Judy [name] MRN: [id number] Date of Birth: [birth year]  Transition of Care [facility]) CM/SW Contact  Garret Reddish, RN Phone Number: 01/24/2023, 11:09 AM  Clinical Narrative:   Chart reviewed.  I have reviewed bed offers with [name] patient's  POA. [name] has chosen [facility].  I have informed [name], Admission Coordinator at [facility] of bed offer acceptance. [name] reports that she can accept patient on tomorrow.  I have made [name] and provider aware.    I have informed [name] that [facility] will transport patient to the facility on tomorrow.    TOC will continue to follow for discharge planning.      Expected Discharge Plan:  (Pending Medical work up) Barriers to Discharge: Continued Medical Work up  Expected Discharge Plan and Services   Discharge Planning Services: CM Consult Post Acute Care Choice:  (Home with Home health v/s SNF) Living arrangements for the past 2 months: Single Family Home                                       Social Determinants of Health (SDOH) Interventions SDOH Screenings   Food Insecurity: No Food Insecurity (01/10/2023)  Housing: Low Risk  (01/10/2023)  Transportation Needs: No Transportation Needs (01/10/2023)  Utilities: Not At Risk (01/10/2023)  Social Connections: Unknown (10/27/2021)   Received from [facility]  Tobacco Use: Low Risk  (01/16/2023)    Readmission Risk Interventions    01/20/2023    9:57 AM  Readmission Risk Prevention Plan  Transportation Screening Complete  PCP or Specialist Appt within 5-7 Days Complete  Home Care Screening Complete  Medication Review (RN CM) Complete

## 2023-01-24 NOTE — Plan of Care (Signed)
  Problem: Education: Goal: Ability to demonstrate management of disease process will improve Outcome: Progressing Goal: Ability to verbalize understanding of medication therapies will improve Outcome: Progressing Goal: Individualized Educational Video(s) Outcome: Progressing   Problem: Activity: Goal: Capacity to carry out activities will improve Outcome: Progressing   Problem: Clinical Measurements: Goal: Diagnostic test results will improve Outcome: Progressing Goal: Signs and symptoms of infection will decrease Outcome: Progressing

## 2023-01-25 ENCOUNTER — Telehealth: Payer: Self-pay | Admitting: Nurse Practitioner

## 2023-01-25 ENCOUNTER — Encounter: Payer: Self-pay | Admitting: Student

## 2023-01-25 DIAGNOSIS — J9601 Acute respiratory failure with hypoxia: Secondary | ICD-10-CM | POA: Diagnosis not present

## 2023-01-25 MED ORDER — FUROSEMIDE 40 MG PO TABS: 20.0000 mg | ORAL_TABLET | ORAL | 0 refills | Status: DC

## 2023-01-25 MED ORDER — ADULT MULTIVITAMIN W/MINERALS CH: 1.0000 | ORAL_TABLET | Freq: Every day | ORAL | 0 refills | Status: AC

## 2023-01-25 MED ORDER — FUROSEMIDE 40 MG PO TABS: 20.0000 mg | ORAL_TABLET | Freq: Every day | ORAL | 0 refills | Status: DC | PRN

## 2023-01-25 MED ORDER — ENSURE ENLIVE PO LIQD: 237.0000 mL | Freq: Three times a day (TID) | ORAL | 12 refills | Status: AC

## 2023-01-25 MED ORDER — FERROUS SULFATE 325 (65 FE) MG PO TABS: 325.0000 mg | ORAL_TABLET | Freq: Two times a day (BID) | ORAL | 3 refills | Status: DC

## 2023-01-25 NOTE — Progress Notes (Unsigned)
Provider:  Dr. Earnestine Mealing Location:  Other [facility].  Nursing Home Room Number: [facility] 109A Place of Service:  SNF (31)  PCP: Orson Eva, NP Patient Care Team: Orson Eva, NP as PCP - General (Nurse Practitioner)  Extended Emergency Contact Information Primary Emergency Contact: [facility] Phone: [phone] Relation: Friend Secondary Emergency Contact: Gonzalez,Leslie Mobile Phone: [phone] Relation: Friend  Code Status: DNR Goals of Care: Advanced Directive information    01/26/2023    9:19 AM  Advanced Directives  Does Patient Have a Medical Advance Directive? No  Does patient want to make changes to medical advance directive? No - Patient declined      Chief Complaint  Patient presents with   New Admit To SNF    Admission.     HPI: Patient is a 89 y.o. female seen today for admission to [facility] after hospitalization for CHF exacerbation as well as Acute Hypoxic respiratory failure. Found to have CAP treated with Rocephin/Zithromax to Augmentin and acute blood loss anemia secondary to a GI bleed. GI was consulted and no colnoscopy or EGD due to patient's age, however, significant drop to hgb of 6. Patient also with notable malnutrition.   Patient is in her room with her friends from church present (Ms. Humphries). She states she was living at home alone and had a supportive community. She goes with friends to the stores to get vitamins, but doesn't take medications. She has a neighbor who helps her, but no family. If she is ill the person she trusts to make decisions for her is her neighbor/friend. She doesn't know why she went to the hospital. She thinks it is because she couldn't get an appointment with her PCP. She does state she does not want to return to the hospital if she gets ill. Confirms DNR status.   Denies pain. Says she is eating well.    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   transferred from [facility] to [facility] on 10/12/19, discharged from ED   Past Surgical History:  Procedure Laterality Date   CATARACT EXTRACTION W/PHACO Left 01/21/2020   Procedure: CATARACT EXTRACTION PHACO AND INTRAOCULAR LENS PLACEMENT (IOC) LEFT;  Surgeon: Nevada Crane, MD;  Location: [facility];  Service: Ophthalmology;  Laterality: Left;   CATARACT EXTRACTION W/PHACO Right 03/10/2020   Procedure: CATARACT EXTRACTION PHACO AND INTRAOCULAR LENS PLACEMENT (IOC) RIGHT 4.49  00:44.5;  Surgeon: Nevada Crane, MD;  Location: [facility];  Service: Ophthalmology;  Laterality: Right;   TOTAL HIP ARTHROPLASTY Right 2009    reports that she has never smoked. She has never used smokeless tobacco. She reports that she does not drink alcohol and does not use drugs. Social History   Socioeconomic History   Marital status: Married    Spouse name: Not on file   Number of children: Not on file   Years of education: Not on file   Highest education level: Not on file  Occupational History   Not on file  Tobacco Use   Smoking status: Never   Smokeless tobacco: Never  Substance and Sexual Activity   Alcohol use: No   Drug use: Never   Sexual activity: Not on file  Other Topics Concern   Not on file  Social History Narrative   Not on file   Social Determinants of Health   Financial Resource Strain: Not  on file  Food Insecurity: No Food Insecurity (01/10/2023)   Hunger Vital Sign    Worried About Running Out of Food in the Last Year: Never true    Ran Out of Food in the Last Year: Never true  Transportation Needs: No Transportation Needs (01/10/2023)   PRAPARE - Administrator, Civil Service (Medical): No    Lack of Transportation (Non-Medical): No  Physical Activity: Not on file  Stress: Not on file  Social Connections: Unknown (10/27/2021)   Received from [facility]   Social  Network    Social Network: Not on file  Intimate Partner Violence: Not At Risk (01/10/2023)   Humiliation, Afraid, Rape, and Kick questionnaire    Fear of Current or Ex-Partner: No    Emotionally Abused: No    Physically Abused: No    Sexually Abused: No    Functional Status Survey:    History reviewed. No pertinent family history.  Health Maintenance  Topic Date Due   Medicare Annual Wellness (AWV)  Never done   DTaP/Tdap/Td (1 - Tdap) Never done   Zoster Vaccines- Shingrix (1 of 2) Never done   Pneumonia Vaccine 49+ Years old (1 of 1 - PCV) Never done   DEXA SCAN  Never done   COVID-19 Vaccine (3 - 2023-24 season) 02/12/2022   INFLUENZA VACCINE  01/13/2023   HPV VACCINES  Aged Out    No Known Allergies  Outpatient Encounter Medications as of 01/26/2023  Medication Sig   aspirin 81 MG tablet Take 81 mg by mouth daily.   bisacodyl (DULCOLAX) 5 MG EC tablet Take 1 tablet (5 mg total) by mouth daily as needed for moderate constipation.   Calcium Carbonate 500 MG CHEW Chew 500 mg by mouth daily.   cholecalciferol (VITAMIN D3) 25 MCG (1000 UNIT) tablet Take 1,000 Units by mouth daily.   docusate sodium (COLACE) 100 MG capsule Take 1 capsule (100 mg total) by mouth 2 (two) times daily.   feeding supplement (ENSURE ENLIVE / ENSURE PLUS) LIQD Take 237 mLs by mouth 3 (three) times daily between meals.   ferrous sulfate 325 (65 FE) MG tablet Take 1 tablet (325 mg total) by mouth 2 (two) times daily with a meal.   furosemide (LASIX) 20 MG tablet Take 20 mg by mouth. Every 24 hours as needed   loratadine (CLARITIN) 10 MG tablet TAKE 1 TABLET BY MOUTH ONCE DAILY   Multiple Vitamin (MULTIVITAMIN WITH MINERALS) TABS tablet Take 1 tablet by mouth daily.   oxybutynin (DITROPAN) 5 MG tablet TAKE 1 TABLET BY MOUTH TWICE DAILY.   pantoprazole (PROTONIX) 40 MG tablet Take 1 tablet (40 mg total) by mouth daily.   senna (SENOKOT) 8.6 MG TABS tablet Take 1 tablet (8.6 mg total) by mouth 2 (two)  times daily.   vitamin E (VITAMIN E) 180 MG (400 UNITS) capsule Take 400 Units by mouth daily.   Zinc Oxide (TRIPLE PASTE) 12.8 % ointment Apply 1 Application topically. Every shift.   [DISCONTINUED] furosemide (LASIX) 40 MG tablet Take 0.5 tablets (20 mg total) by mouth every other day. (Patient taking differently: Take 20 mg by mouth every other day. As needed.)   No facility-administered encounter medications on file as of 01/26/2023.    Review of Systems  Vitals:   01/26/23 0909 01/26/23 0920  BP: (!) 144/81 120/76  Pulse: 85   Resp: 16   Temp: (!) 97.4 F (36.3 C)   SpO2: 96%   Weight: 129  lb 8 oz (58.7 kg)   Height: 5\' 7"  (1.702 m)    Body mass index is 20.28 kg/m. Physical Exam Constitutional:      Appearance: She is ill-appearing.     Comments: Temporal and buccal muscle wasting, bilateral upper extremity and lower extremity atrophied muscles.   Cardiovascular:     Rate and Rhythm: Normal rate.     Pulses: Normal pulses.  Pulmonary:     Effort: Pulmonary effort is normal.  Abdominal:     General: Abdomen is flat.     Palpations: Abdomen is soft.  Musculoskeletal:     Comments: Left lower extremity with redness and tenderness to palpation left ankle with eversion and tenderness  Neurological:     Mental Status: She is alert.     Comments: She is oriented to self, August 2024, not to place or situation     Labs reviewed: Basic Metabolic Panel: Recent Labs    01/19/23 0623 01/20/23 0901 01/21/23 1012  NA 137 134* 137  K 3.4* 3.3* 3.8  CL 110 104 103  CO2 21* 23 25  GLUCOSE 86 88 97  BUN 28* 20 14  CREATININE 0.68 0.63 0.63  CALCIUM 7.5* 7.6* 8.1*   Liver Function Tests: Recent Labs    01/11/23 0449 01/16/23 1148 01/17/23 0502  AST 18 20 14*  ALT 12 17 15   ALKPHOS 51 62 52  BILITOT 0.5 0.2* 0.7  PROT 3.0* 3.5* 3.4*  ALBUMIN <1.5* <1.5* <1.5*   Recent Labs    01/04/23 1134 01/10/23 1619  LIPASE 44 36   No results for input(s): "AMMONIA"  in the last 8760 hours. CBC: Recent Labs    01/11/23 0449 01/16/23 1148 01/17/23 0502 01/18/23 0521 01/19/23 0623 01/20/23 0901 01/21/23 1012 01/23/23 0412  WBC 11.5* 26.6*   < > 16.4*   < > 11.4* 11.7* 9.3  NEUTROABS 8.2* 22.4*  --  12.9*  --   --   --   --   HGB 8.0* 9.0*   < > 6.4*   < > 9.1* 10.7* 8.3*  HCT 25.9* 27.9*   < > 20.2*   < > 28.0* 33.3* 25.8*  MCV 94.9 89.4   < > 88.6   < > 87.2 87.2 87.2  PLT 515* 972*   < > 569*   < > 548* 729* 491*   < > = values in this interval not displayed.   Cardiac Enzymes: No results for input(s): "CKTOTAL", "CKMB", "CKMBINDEX", "TROPONINI" in the last 8760 hours. BNP: Invalid input(s): "POCBNP" Lab Results  Component Value Date   HGBA1C 5.6 08/02/2022   Lab Results  Component Value Date   TSH 2.780 08/02/2022   No results found for: "VITAMINB12" No results found for: "FOLATE" Lab Results  Component Value Date   IRON 20 (L) 01/18/2023   TIBC 123 (L) 01/18/2023    Imaging and Procedures obtained prior to SNF admission: ECHOCARDIOGRAM COMPLETE  Result Date: 01/17/2023    ECHOCARDIOGRAM REPORT   Patient Name:   Judy [name] Date of Exam: 01/17/2023 Medical Rec #:  [id number]          Height:       67.0 in Accession #:    [id number]         Weight:       151.9 lb Date of Birth:  [birth year]         BSA:          1.799 m Patient Age:  89 years           BP:           114/68 mmHg Patient Gender: F                  HR:           92 bpm. Exam Location:  [facility] Procedure: 2D Echo, Cardiac Doppler and Color Doppler Indications:     CHF---acute systolic I50.21  History:         Patient has no prior history of Echocardiogram examinations.  Sonographer:     Cristela Blue Referring Phys:  [phone] Francoise Schaumann [place] Diagnosing Phys: Yvonne Kendall MD  Sonographer Comments: Technically challenging study due to limited acoustic windows and suboptimal parasternal window. IMPRESSIONS  1. Left ventricular ejection fraction, by estimation, is 55 to 60%. The  left ventricle has normal function. The left ventricle has no regional wall motion abnormalities. Left ventricular diastolic parameters are consistent with Grade I diastolic dysfunction (impaired relaxation).  2. Right ventricular systolic function is normal. The right ventricular size is normal. There is normal pulmonary artery systolic pressure.  3. The mitral valve is degenerative. Mild mitral valve regurgitation. No evidence of mitral stenosis.  4. Tricuspid valve regurgitation is mild to moderate.  5. The aortic valve was not well visualized. There is moderate calcification of the aortic valve. There is moderate thickening of the aortic valve. Aortic valve regurgitation is not visualized. Mild aortic valve stenosis.  6. Pulmonic valve regurgitation not well-assessed.  7. The inferior vena cava is normal in size with greater than 50% respiratory variability, suggesting right atrial pressure of 3 mmHg. FINDINGS  Left Ventricle: Left ventricular ejection fraction, by estimation, is 55 to 60%. The left ventricle has normal function. The left ventricle has no regional wall motion abnormalities. The left ventricular internal cavity size was normal in size. There is  borderline left ventricular hypertrophy. Left ventricular diastolic parameters are consistent with Grade I diastolic dysfunction (impaired relaxation). Right Ventricle: The right ventricular size is normal. No increase in right ventricular wall thickness. Right ventricular systolic function is normal. There is normal pulmonary artery systolic pressure. The tricuspid regurgitant velocity is 2.82 m/s, and  with an assumed right atrial pressure of 3 mmHg, the estimated right ventricular systolic pressure is 34.8 mmHg. Left Atrium: Left atrial size was normal in size. Right Atrium: Right atrial size was normal in size. Pericardium: There is no evidence of pericardial effusion. Mitral Valve: The mitral valve is degenerative in appearance. There is mild  thickening of the mitral valve leaflet(s). There is mild calcification of the mitral valve leaflet(s). Mild mitral annular calcification. Mild mitral valve regurgitation. No evidence of mitral valve stenosis. MV peak gradient, 10.6 mmHg. The mean mitral valve gradient is 4.0 mmHg. Tricuspid Valve: The tricuspid valve is not well visualized. Tricuspid valve regurgitation is mild to moderate. Aortic Valve: The aortic valve was not well visualized. There is moderate calcification of the aortic valve. There is moderate thickening of the aortic valve. Aortic valve regurgitation is not visualized. Mild aortic stenosis is present. Aortic valve mean gradient measures 12.3 mmHg. Aortic valve peak gradient measures 22.0 mmHg. Aortic valve area, by VTI measures 2.24 cm. Pulmonic Valve: The pulmonic valve was not well visualized. Pulmonic valve regurgitation not well-assessed. Aorta: The aortic root is normal in size and structure. Venous: The inferior vena cava is normal in size with greater than 50% respiratory variability, suggesting right atrial pressure of 3 mmHg. IAS/Shunts:  The interatrial septum was not well visualized.  LEFT VENTRICLE PLAX 2D LVIDd:         2.90 cm   Diastology LVIDs:         2.00 cm   LV e' medial:    6.74 cm/s LV PW:         0.90 cm   LV E/e' medial:  11.7 LV IVS:        1.10 cm   LV e' lateral:   7.40 cm/s LVOT diam:     1.90 cm   LV E/e' lateral: 10.7 LV SV:         88 LV SV Index:   49 LVOT Area:     2.84 cm  RIGHT VENTRICLE RV Basal diam:  3.40 cm RV Mid diam:    2.60 cm RV S prime:     15.00 cm/s TAPSE (M-mode): 2.1 cm LEFT ATRIUM           Index        RIGHT ATRIUM           Index LA diam:      3.30 cm 1.83 cm/m   RA Area:     11.20 cm LA Vol (A2C): 23.2 ml 12.90 ml/m  RA Volume:   23.90 ml  13.28 ml/m LA Vol (A4C): 8.6 ml  4.79 ml/m  AORTIC VALVE AV Area (Vmax):    1.79 cm AV Area (Vmean):   1.57 cm AV Area (VTI):     2.24 cm AV Vmax:           234.67 cm/s AV Vmean:          163.667  cm/s AV VTI:            0.393 m AV Peak Grad:      22.0 mmHg AV Mean Grad:      12.3 mmHg LVOT Vmax:         148.00 cm/s LVOT Vmean:        90.400 cm/s LVOT VTI:          0.310 m LVOT/AV VTI ratio: 0.79  AORTA Ao Root diam: 2.70 cm MITRAL VALVE                TRICUSPID VALVE MV Area (PHT): 3.43 cm     TR Peak grad:   31.8 mmHg MV Area VTI:   3.37 cm     TR Vmax:        282.00 cm/s MV Peak grad:  10.6 mmHg MV Mean grad:  4.0 mmHg     SHUNTS MV Vmax:       1.63 m/s     Systemic VTI:  0.31 m MV Vmean:      96.0 cm/s    Systemic Diam: 1.90 cm MV Decel Time: 221 msec MV E velocity: 79.10 cm/s MV A velocity: 140.00 cm/s MV E/A ratio:  0.57 Christopher End MD Electronically signed by Yvonne Kendall MD Signature Date/Time: 01/17/2023/5:18:54 PM    Final     Assessment/Plan Acute respiratory failure with hypoxia (HCC)  DNR (do not resuscitate) - Plan: Do not attempt resuscitation (DNR)  Subdural hematoma (HCC), Chronic  Stage 3a chronic kidney disease (HCC)  Malnutrition of moderate degree  Primary hypertension Patient with CHF exacerbation complicated by acute hypoxic respiratory failure. No on room air. Patient with muscle wasting on protein supplementation. Albumin low during hospialization. GOC DNR/DNH. Hx of subdural in 09/2019. Continue ASA and iron supplementation. No signs of  bleeding. Leg tenderness and redness unclear etiology, will plan for ultrasound to make sure there is no blood clot. Given extent of tenderness could impact physical therapy. Uric Acid to evaluate for gout as well. F/u results. Continue oxybutinin, however, will need to discontinue if patient has persistent signs of memory deficits that were not previously apparent.   Family/ staff Communication: Nursing  Labs/tests ordered: CBC, CMP, [place] LLE, Uric Acid

## 2023-01-25 NOTE — Discharge Summary (Signed)
Physician Discharge Summary   Patient: Judy [name] MRN: [id number] DOB: 04-19-1926  Admit date:     01/16/2023  Discharge date: 01/25/23  Discharge Physician: Enedina Finner   PCP: Orson Eva, NP   Recommendations at discharge:   F/u PCP in 1-2 weeks  Discharge Diagnoses: Principal Problem:   Acute respiratory failure with hypoxia (HCC) Active Problems:   Leukocytosis (leucocytosis)   Hypertension   Stage 3a chronic kidney disease (HCC)   Gastroesophageal reflux disease without esophagitis   Thrombocytosis   Malnutrition of moderate degree   Judy [name] is a 89 y.o. female with medical history significant of hypertension, hypertension, constipation admitted to the hospital with acute respiratory failure with hypoxia, secondary to community-acquired pneumonia and acute diastolic dysfunction CHF.  Patient is also malnourished and has very low albumin levels contributing to her anasarca.       Acute diastolic dysfunction congestive heart failure: Improving    --Echocardiogram with LVEF of 55 to 60% with evidence of grade 1 diastolic dysfunction Imaging showed moderate bilateral pleural effusions with dependent atelectasis --Change Lasix 40 mg IV daily to Lasix PO 20 mg every other day --on RA   Acute respiratory failure with hypoxia [facility]):  --Currently on Room air (weaned off of O2 [place]) --Acute respiratory failure appears to be secondary to acute diastolic CHF and community-acquired pneumonia. --Improving clinically--on RA sats 94%   Anemia due to GI loss: GI consulted. --Hb improved from 6--->10.7--8.3 --As per GI, no plans for EGD/Colonoscopy given advanced age, diuresis with lasix, no overt GI bleeding, and patient would not want any treatment for a malignancy if one was found --Continue PPI and iron sulfate   Community-acquired pneumonia --Leukocytosis improving and stable: 27.8 >> 16.4 >> 11.4--> 11.7--9.3 --Patient was treated empirically with Rocephin and  Zithromax and has been switched to --Augmentin to complete a 7-day course of therapy --Blood culture showed no growth   Acute kidney injury: creatinine improved to 0.63 --came in with creat 1.63   Thrombocytosis/leukocytosis/iron deficiency anemia --Most likely secondary to an acute infectious etiology --No obvious bleeding.  --Patient was transfused 1 unit of packed RBC on 01/18/23 with improvement in her H&H   Gastroesophageal reflux disease without esophagitis PPI and Carafate   Hypertension BP is stable   Hypokalemia Secondary to diuretic therapy Replace as appropriate K 3.8   Moderate malnutrition --MVI with minerals daily --Ensure Enlive po TID --Liberalize diet to regular for widest variety of meal selections    overall improving. TOC for discharge planning to rehab.   Family communication :neighbor friend in the room Consults :GI CODE STATUS: DNR DVT Prophylaxis :SCD      Disposition: Rehabilitation facility Diet recommendation:  Discharge Diet Orders (From admission, onward)     Start     Ordered   01/25/23 0000  Diet - low sodium heart healthy        01/25/23 0947           Cardiac diet DISCHARGE MEDICATION: Allergies as of 01/25/2023   No Known Allergies      Medication List     STOP taking these medications    benzonatate 100 MG capsule Commonly known as: Tessalon Perles   ondansetron 4 MG disintegrating tablet Commonly known as: ZOFRAN-ODT   sucralfate 1 GM/10ML suspension Commonly known as: Carafate       TAKE these medications    aspirin 81 MG tablet Take 81 mg by mouth daily.   bisacodyl 5 MG EC tablet Commonly  known as: DULCOLAX Take 1 tablet (5 mg total) by mouth daily as needed for moderate constipation.   Calcium Carbonate 500 MG Chew Chew 500 mg by mouth daily.   cholecalciferol 25 MCG (1000 UNIT) tablet Commonly known as: VITAMIN D3 Take 1,000 Units by mouth daily.   docusate sodium 100 MG capsule Commonly  known as: COLACE Take 1 capsule (100 mg total) by mouth 2 (two) times daily.   feeding supplement Liqd Take 237 mLs by mouth 3 (three) times daily between meals.   ferrous sulfate 325 (65 FE) MG tablet Take 1 tablet (325 mg total) by mouth 2 (two) times daily with a meal.   furosemide 40 MG tablet Commonly known as: LASIX Take 0.5 tablets (20 mg total) by mouth daily as needed. For leg edema or shortness of breath   loratadine 10 MG tablet Commonly known as: CLARITIN TAKE 1 TABLET BY MOUTH ONCE DAILY   multivitamin with minerals Tabs tablet Take 1 tablet by mouth daily. Start taking on: January 26, 2023   oxybutynin 5 MG tablet Commonly known as: DITROPAN TAKE 1 TABLET BY MOUTH TWICE DAILY.   pantoprazole 40 MG tablet Commonly known as: Protonix Take 1 tablet (40 mg total) by mouth daily. What changed: when to take this   senna 8.6 MG Tabs tablet Commonly known as: SENOKOT Take 1 tablet (8.6 mg total) by mouth 2 (two) times daily.   vitamin E 180 MG (400 UNITS) capsule Take 400 Units by mouth daily.        Discharge Exam: Filed Weights   01/23/23 0349 01/24/23 0500 01/25/23 0500  Weight: 66.7 kg 62.4 kg 59.4 kg   GENERAL:  89 y.o.-year-old patient with no acute distress. weak LUNGS: Normal breath sounds bilaterally, no wheezing CARDIOVASCULAR: S1, S2 normal. No murmur   ABDOMEN: Soft, nontender, nondistended. Bowel sounds present.  EXTREMITIES: 1+ edema b/l.    NEUROLOGIC: nonfocal  patient is alert and awake  Condition at discharge: fair  The results of significant diagnostics from this hospitalization (including imaging, microbiology, ancillary and laboratory) are listed below for reference.   Imaging Studies: DG Chest Port 1 View  Result Date: 01/19/2023 CLINICAL DATA:  Shortness of breath EXAM: PORTABLE CHEST 1 VIEW COMPARISON:  Chest radiograph 01/16/2023 FINDINGS: The cardiomediastinal silhouette is stable, allowing for differences in patient rotation.  Small to moderate-sized right and small left left pleural effusions are again seen, increased on the right since the prior study, with adjacent airspace opacity in the lung bases. The upper lobes are clear. There is no pneumothorax There is no acute osseous abnormality. There is advanced degenerative change of both shoulders. The nonunited distal left clavicle fracture is unchanged. IMPRESSION: Right shoulder than left pleural effusions with adjacent airspace opacities which may reflect atelectasis or infection, slightly increased on the right since the prior study. Electronically Signed   By: Lesia Hausen M.D.   On: 01/19/2023 09:40   ECHOCARDIOGRAM COMPLETE  Result Date: 01/17/2023    ECHOCARDIOGRAM REPORT   Patient Name:   NIRMALA MATERNE Date of Exam: 01/17/2023 Medical Rec #:  [id number]          Height:       67.0 in Accession #:    [id number]         Weight:       151.9 lb Date of Birth:  06/01/1926         BSA:          1.799 m Patient Age:  89 years           BP:           114/68 mmHg Patient Gender: F                  HR:           92 bpm. Exam Location:  [facility] Procedure: 2D Echo, Cardiac Doppler and Color Doppler Indications:     CHF---acute systolic I50.21  History:         Patient has no prior history of Echocardiogram examinations.  Sonographer:     Cristela Blue Referring Phys:  [phone] Francoise Schaumann [place] Diagnosing Phys: Yvonne Kendall MD  Sonographer Comments: Technically challenging study due to limited acoustic windows and suboptimal parasternal window. IMPRESSIONS  1. Left ventricular ejection fraction, by estimation, is 55 to 60%. The left ventricle has normal function. The left ventricle has no regional wall motion abnormalities. Left ventricular diastolic parameters are consistent with Grade I diastolic dysfunction (impaired relaxation).  2. Right ventricular systolic function is normal. The right ventricular size is normal. There is normal pulmonary artery systolic pressure.  3. The mitral valve  is degenerative. Mild mitral valve regurgitation. No evidence of mitral stenosis.  4. Tricuspid valve regurgitation is mild to moderate.  5. The aortic valve was not well visualized. There is moderate calcification of the aortic valve. There is moderate thickening of the aortic valve. Aortic valve regurgitation is not visualized. Mild aortic valve stenosis.  6. Pulmonic valve regurgitation not well-assessed.  7. The inferior vena cava is normal in size with greater than 50% respiratory variability, suggesting right atrial pressure of 3 mmHg. FINDINGS  Left Ventricle: Left ventricular ejection fraction, by estimation, is 55 to 60%. The left ventricle has normal function. The left ventricle has no regional wall motion abnormalities. The left ventricular internal cavity size was normal in size. There is  borderline left ventricular hypertrophy. Left ventricular diastolic parameters are consistent with Grade I diastolic dysfunction (impaired relaxation). Right Ventricle: The right ventricular size is normal. No increase in right ventricular wall thickness. Right ventricular systolic function is normal. There is normal pulmonary artery systolic pressure. The tricuspid regurgitant velocity is 2.82 m/s, and  with an assumed right atrial pressure of 3 mmHg, the estimated right ventricular systolic pressure is 34.8 mmHg. Left Atrium: Left atrial size was normal in size. Right Atrium: Right atrial size was normal in size. Pericardium: There is no evidence of pericardial effusion. Mitral Valve: The mitral valve is degenerative in appearance. There is mild thickening of the mitral valve leaflet(s). There is mild calcification of the mitral valve leaflet(s). Mild mitral annular calcification. Mild mitral valve regurgitation. No evidence of mitral valve stenosis. MV peak gradient, 10.6 mmHg. The mean mitral valve gradient is 4.0 mmHg. Tricuspid Valve: The tricuspid valve is not well visualized. Tricuspid valve regurgitation is  mild to moderate. Aortic Valve: The aortic valve was not well visualized. There is moderate calcification of the aortic valve. There is moderate thickening of the aortic valve. Aortic valve regurgitation is not visualized. Mild aortic stenosis is present. Aortic valve mean gradient measures 12.3 mmHg. Aortic valve peak gradient measures 22.0 mmHg. Aortic valve area, by VTI measures 2.24 cm. Pulmonic Valve: The pulmonic valve was not well visualized. Pulmonic valve regurgitation not well-assessed. Aorta: The aortic root is normal in size and structure. Venous: The inferior vena cava is normal in size with greater than 50% respiratory variability, suggesting right atrial pressure of 3 mmHg. IAS/Shunts:  The interatrial septum was not well visualized.  LEFT VENTRICLE PLAX 2D LVIDd:         2.90 cm   Diastology LVIDs:         2.00 cm   LV e' medial:    6.74 cm/s LV PW:         0.90 cm   LV E/e' medial:  11.7 LV IVS:        1.10 cm   LV e' lateral:   7.40 cm/s LVOT diam:     1.90 cm   LV E/e' lateral: 10.7 LV SV:         88 LV SV Index:   49 LVOT Area:     2.84 cm  RIGHT VENTRICLE RV Basal diam:  3.40 cm RV Mid diam:    2.60 cm RV S prime:     15.00 cm/s TAPSE (M-mode): 2.1 cm LEFT ATRIUM           Index        RIGHT ATRIUM           Index LA diam:      3.30 cm 1.83 cm/m   RA Area:     11.20 cm LA Vol (A2C): 23.2 ml 12.90 ml/m  RA Volume:   23.90 ml  13.28 ml/m LA Vol (A4C): 8.6 ml  4.79 ml/m  AORTIC VALVE AV Area (Vmax):    1.79 cm AV Area (Vmean):   1.57 cm AV Area (VTI):     2.24 cm AV Vmax:           234.67 cm/s AV Vmean:          163.667 cm/s AV VTI:            0.393 m AV Peak Grad:      22.0 mmHg AV Mean Grad:      12.3 mmHg LVOT Vmax:         148.00 cm/s LVOT Vmean:        90.400 cm/s LVOT VTI:          0.310 m LVOT/AV VTI ratio: 0.79  AORTA Ao Root diam: 2.70 cm MITRAL VALVE                TRICUSPID VALVE MV Area (PHT): 3.43 cm     TR Peak grad:   31.8 mmHg MV Area VTI:   3.37 cm     TR Vmax:         282.00 cm/s MV Peak grad:  10.6 mmHg MV Mean grad:  4.0 mmHg     SHUNTS MV Vmax:       1.63 m/s     Systemic VTI:  0.31 m MV Vmean:      96.0 cm/s    Systemic Diam: 1.90 cm MV Decel Time: 221 msec MV E velocity: 79.10 cm/s MV A velocity: 140.00 cm/s MV E/A ratio:  0.57 Cristal [place] MD Electronically signed by Yvonne Kendall MD Signature Date/Time: 01/17/2023/5:18:54 PM    Final    CT CHEST ABDOMEN PELVIS WO CONTRAST  Result Date: 01/16/2023 CLINICAL DATA:  Sepsis. EXAM: CT CHEST, ABDOMEN AND PELVIS WITHOUT CONTRAST TECHNIQUE: Multidetector CT imaging of the chest, abdomen and pelvis was performed following the standard protocol without IV contrast. RADIATION DOSE REDUCTION: This exam was performed according to the departmental dose-optimization program which includes automated exposure control, adjustment of the mA and/or kV according to patient size and/or use of iterative reconstruction technique. COMPARISON:  Chest x-ray from  same day. Right upper quadrant ultrasound and CT abdomen pelvis dated January 10, 2023. FINDINGS: CT CHEST FINDINGS Cardiovascular: No significant vascular findings. Normal heart size. No pericardial effusion. No thoracic aortic aneurysm. Coronary, aortic arch, and branch vessel atherosclerotic vascular disease. Mediastinum/Nodes: No enlarged mediastinal, hilar, or axillary lymph nodes. Thyroid gland, trachea, and esophagus demonstrate no significant findings. Lungs/Pleura: Moderate bilateral pleural effusions. Dependent atelectasis in both posterior lower lobes. No consolidation or pneumothorax. Musculoskeletal: No acute or significant osseous findings. Nonunited left distal clavicle fracture, likely chronic. End-stage bilateral glenohumeral osteoarthritis. CT ABDOMEN PELVIS FINDINGS Hepatobiliary: No focal liver abnormality. Unchanged large gallstone. No gallbladder wall thickening or biliary dilatation. Pancreas: Unremarkable. No pancreatic ductal dilatation or surrounding inflammatory  changes. Spleen: Normal in size without focal abnormality. Adrenals/Urinary Tract: Adrenal glands are unremarkable. Unchanged 1.4 cm left renal simple cyst. No follow-up imaging is recommended. No renal calculi or hydronephrosis. The bladder is unremarkable. Stomach/Bowel: Stomach is within normal limits. Appendix appears normal. No evidence of bowel wall thickening, distention, or inflammatory changes. Sigmoid colonic diverticulosis. Vascular/Lymphatic: Aortic atherosclerosis. No enlarged abdominal or pelvic lymph nodes. Reproductive: Uterus and bilateral adnexa are unremarkable. Other: Small amount of free fluid in the pelvis. No pneumoperitoneum. Musculoskeletal: No acute or significant osseous findings. Mild anasarca. IMPRESSION: 1. Moderate bilateral pleural effusions with dependent atelectasis in both posterior lower lobes. 2. Small pelvic ascites. 3. Unchanged cholelithiasis. 4.  Aortic Atherosclerosis (ICD10-I70.0). Electronically Signed   By: Obie Dredge M.D.   On: 01/16/2023 14:28   DG Chest Port 1 View  Result Date: 01/16/2023 CLINICAL DATA:  Leukocytosis.  Possible sepsis. EXAM: PORTABLE CHEST 1 VIEW COMPARISON:  01/10/2023 FINDINGS: Low volume film with bibasilar atelectasis and small bilateral pleural effusions. No pulmonary edema. Cardiopericardial silhouette is at upper limits of normal for size. No acute bony abnormality. Degenerative changes noted in both shoulders with chronic posttraumatic deformity left distal clavicle, similar to prior. Telemetry leads overlie the chest. IMPRESSION: Low volume film with bibasilar atelectasis and small bilateral pleural effusions. Electronically Signed   By: Kennith Center M.D.   On: 01/16/2023 12:11   US Abdomen Limited RUQ (LIVER/GB)  Result Date: 01/10/2023 CLINICAL DATA:  Gallbladder mass. EXAM: ULTRASOUND ABDOMEN LIMITED RIGHT UPPER QUADRANT COMPARISON:  None Available. FINDINGS: Gallbladder: A 1.3 cm x 1.0 cm x 0.9 cm partially calcified  echogenic area is seen within the gallbladder lumen. Flow is not clearly identified within this region on color Doppler evaluation. The gallbladder wall measures 1.8 mm in thickness. No sonographic Murphy sign noted by sonographer. Common bile duct: Diameter: 2.5 mm Liver: No focal lesion identified. Diffusely increased echogenicity of the liver parenchyma is noted. Portal vein is patent on color Doppler imaging with normal direction of blood flow towards the liver. Other: Limited study secondary to patient motion, as per the ultrasound technologist. IMPRESSION: 1. Findings suspicious for a partially calcified gallbladder mass. MRI correlation is recommended to further exclude the presence of an underlying neoplastic process. 2. Hepatic steatosis without focal liver lesions. Electronically Signed   By: Aram Candela M.D.   On: 01/10/2023 19:09   DG Chest Portable 1 View  Result Date: 01/10/2023 CLINICAL DATA:  weakness EXAM: PORTABLE CHEST 1 VIEW COMPARISON:  None Available. FINDINGS: Low lung volume. There are probable atelectatic changes at the left lung base with associated trace left pleural effusion. There is apparent blunting of right lateral costophrenic angle, which may be due to overlying soft tissue. Normal cardio-mediastinal silhouette. No acute osseous abnormalities. Old nonunited fracture  of lateral left clavicle noted. Note is made of bilateral high riding humeri in relation to glenoid, concerning for complete rotator cuff tear. The soft tissues are within normal limits. IMPRESSION: Probable atelectatic changes at the left lung base with associated trace left pleural effusion. Electronically Signed   By: Jules Schick M.D.   On: 01/10/2023 15:51   CT ABDOMEN PELVIS W CONTRAST  Result Date: 01/10/2023 CLINICAL DATA:  abd pain EXAM: CT ABDOMEN AND PELVIS WITH CONTRAST TECHNIQUE: Multidetector CT imaging of the abdomen and pelvis was performed using the standard protocol following bolus  administration of intravenous contrast. RADIATION DOSE REDUCTION: This exam was performed according to the departmental dose-optimization program which includes automated exposure control, adjustment of the mA and/or kV according to patient size and/or use of iterative reconstruction technique. CONTRAST:  75mL OMNIPAQUE IOHEXOL 300 MG/ML  SOLN COMPARISON:  None Available. FINDINGS: Lower chest: There are bilateral small pleural effusions. There are dependent changes in bilateral lungs. Bilateral lungs are otherwise clear. The heart is normal in size. No pericardial effusion. Hepatobiliary: The liver is normal in size. Non-cirrhotic configuration. No suspicious mass. These is mild diffuse hepatic steatosis. No intrahepatic or extrahepatic bile duct dilation. Physiologically distended gallbladder. There is a predominantly hyperattenuating approximately 2.6 x 3.1 cm rounded masslike area nearly completely filling the gallbladder lumen. There is central faint dystrophic calcification. This is incompletely characterized on the current examination and may represent tumefactive sludge; however, nonemergent right upper quadrant ultrasound is recommended to exclude mass lesion. No pericholecystic fat stranding. Pancreas: Unremarkable. No pancreatic ductal dilatation or surrounding inflammatory changes. Spleen: Within normal limits. No focal lesion. 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 Adrenals/Urinary Tract: Adrenal glands are unremarkable. No suspicious renal mass. There is a 1.4 x 1.8 cm simple cyst arising from the left kidney upper pole, laterally. No hydronephrosis. No renal or ureteric calculi. 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 Urinary bladder is collapsed and not diagnostically evaluated due to extensive streak artifacts. Stomach/Bowel: Stomach is distended with fluid  and dependent food material. There is a medium sized diverticulum arising from the second part of duodenum. No disproportionate dilation of the small or large bowel loops. No evidence of abnormal bowel wall thickening or inflammatory changes. The appendix is unremarkable. There are scattered diverticula mainly in the sigmoid colon, without imaging signs of diverticulitis. Moderate stool burden noted. 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 No abnormal wall thickening or perirectal fat stranding. Vascular/Lymphatic: No ascites or pneumoperitoneum. No abdominal or pelvic lymphadenopathy, by size criteria. No aneurysmal dilation of the major abdominal arteries. There are mild peripheral atherosclerotic vascular calcifications of the aorta and its major branches. Reproductive: Not diagnostically evaluated due to extensive streak artifacts. Other: The visualized soft tissues and abdominal wall are unremarkable. Musculoskeletal: No suspicious osseous lesions. Right total hip arthroplasty noted. There are moderate multilevel degenerative changes in the visualized spine. IMPRESSION: 1. No acute inflammatory process within the abdomen or pelvis. 2. Rectal fecal impaction. Moderate stool burden. No bowel obstruction. 3. Masslike area in the gallbladder lumen. Nonemergent right upper quadrant ultrasound is recommended for further evaluation. 4. Bilateral small pleural effusions. 5. Multiple other nonacute observations, as described above. Electronically Signed   By: Jules Schick M.D.   On: 01/10/2023 13:28   DG Abdomen 1 View  Result Date: 01/10/2023 CLINICAL DATA:  Constipation. EXAM: ABDOMEN - 1 VIEW COMPARISON:  None Available. FINDINGS: The bowel gas pattern is normal. Large stool ball in the rectum. Mildly increased colonic stool burden elsewhere. No radio-opaque calculi or other significant radiographic abnormality are seen. No acute osseous abnormality. Prior right hip  arthroplasty. IMPRESSION: 1. Large stool ball in the rectum. Mildly increased colonic stool burden. Electronically Signed   By: Obie Dredge M.D.   On: 01/10/2023 12:05    Microbiology: Results for orders placed or performed during the hospital encounter of 01/16/23  Blood Culture (routine x 2)     Status: None   Collection Time: 01/16/23 11:48 AM   Specimen: BLOOD  Result Value Ref Range Status   Specimen Description BLOOD LEFT ANTECUBITAL  Final   Special Requests   Final    BOTTLES DRAWN AEROBIC AND ANAEROBIC Blood Culture results may not be optimal due to an inadequate volume of blood received in culture bottles   Culture   Final    NO GROWTH 5 DAYS Performed at [facility], [address], [place], [place] [address]    Report Status 01/21/2023 FINAL  Final  Blood Culture (routine x 2)     Status: Abnormal   Collection Time: 01/16/23  2:45 PM   Specimen: BLOOD  Result Value Ref Range Status   Specimen Description   Final    BLOOD BLOOD LEFT FOREARM Performed at [facility], [address] [place], [place] [address]    Special Requests   Final    BOTTLES DRAWN AEROBIC AND ANAEROBIC Blood Culture adequate volume Performed at [facility], [address] [place], [place] [address]    Culture  Setup Time   Final    GRAM POSITIVE COCCI IN CLUSTERS AEROBIC BOTTLE ONLY Organism ID to follow CRITICAL RESULT CALLED TO, READ BACK BY AND VERIFIED WITH: KRISTEN MERRILL 01/18/23 0931 MW    Culture (A)  Final    KOCURIA SPECIES THE SIGNIFICANCE OF ISOLATING THIS ORGANISM FROM A SINGLE SET OF BLOOD CULTURES WHEN MULTIPLE SETS ARE DRAWN IS UNCERTAIN. PLEASE NOTIFY THE MICROBIOLOGY DEPARTMENT WITHIN ONE WEEK IF SPECIATION AND SENSITIVITIES ARE REQUIRED. Performed at [facility], [address], [place], [place] [address]    Report Status 01/22/2023 FINAL  Final  Blood Culture ID Panel (Reflexed)     Status: None   Collection Time: 01/16/23  2:45  PM  Result Value Ref Range Status   Enterococcus faecalis NOT DETECTED NOT DETECTED Final   Enterococcus Faecium NOT DETECTED NOT DETECTED Final   Listeria monocytogenes NOT DETECTED NOT DETECTED Final   Staphylococcus species NOT DETECTED NOT DETECTED Final   Staphylococcus aureus (BCID) NOT DETECTED NOT DETECTED Final   Staphylococcus epidermidis NOT DETECTED NOT DETECTED Final   Staphylococcus lugdunensis NOT DETECTED NOT DETECTED Final   Streptococcus species NOT DETECTED NOT DETECTED Final   Streptococcus agalactiae NOT DETECTED NOT DETECTED Final   Streptococcus pneumoniae NOT DETECTED NOT DETECTED Final   Streptococcus pyogenes NOT DETECTED NOT DETECTED Final   A.calcoaceticus-baumannii NOT DETECTED NOT DETECTED Final   Bacteroides fragilis NOT DETECTED NOT DETECTED Final   Enterobacterales NOT DETECTED NOT DETECTED Final   Enterobacter cloacae complex NOT DETECTED NOT DETECTED Final   Escherichia coli NOT DETECTED NOT DETECTED Final   Klebsiella aerogenes NOT DETECTED NOT DETECTED Final   Klebsiella oxytoca NOT DETECTED NOT DETECTED Final   Klebsiella pneumoniae NOT DETECTED NOT DETECTED Final   Proteus species NOT DETECTED NOT DETECTED Final  Salmonella species NOT DETECTED NOT DETECTED Final   Serratia marcescens NOT DETECTED NOT DETECTED Final   Haemophilus influenzae NOT DETECTED NOT DETECTED Final   Neisseria meningitidis NOT DETECTED NOT DETECTED Final   Pseudomonas aeruginosa NOT DETECTED NOT DETECTED Final   Stenotrophomonas maltophilia NOT DETECTED NOT DETECTED Final   Candida albicans NOT DETECTED NOT DETECTED Final   Candida auris NOT DETECTED NOT DETECTED Final   Candida glabrata NOT DETECTED NOT DETECTED Final   Candida krusei NOT DETECTED NOT DETECTED Final   Candida parapsilosis NOT DETECTED NOT DETECTED Final   Candida tropicalis NOT DETECTED NOT DETECTED Final   Cryptococcus neoformans/gattii NOT DETECTED NOT DETECTED Final    Comment: Performed at  [facility], [address] [place], [place] [address]    Labs: CBC: Recent Labs  Lab 01/19/23 [phone] 01/20/23 0901 01/21/23 1012 01/23/23 0412  WBC 11.5* 11.4* 11.7* 9.3  HGB 8.5* 9.1* 10.7* 8.3*  HCT 26.2* 28.0* 33.3* 25.8*  MCV 87.6 87.2 87.2 87.2  PLT 539* 548* 729* 491*   Basic Metabolic Panel: Recent Labs  Lab 01/19/23 0623 01/20/23 0901 01/21/23 1012  NA 137 134* 137  K 3.4* 3.3* 3.8  CL 110 104 103  CO2 21* 23 25  GLUCOSE 86 88 97  BUN 28* 20 14  CREATININE 0.68 0.63 0.63  CALCIUM 7.5* 7.6* 8.1*   Discharge time spent: greater than 30 minutes.  Signed: Enedina Finner, MD [facility] 01/25/2023

## 2023-01-25 NOTE — Progress Notes (Signed)
Patient OOF via EMS via stretcher in stable condition.

## 2023-01-25 NOTE — TOC Transition Note (Signed)
Transition of Care [facility]) - CM/SW Discharge Note   Patient Details  Name: Judy [name] MRN: [id number] Date of Birth: 1926-02-12  [facility] CM[facility]SW Contact:  Hetty Ely, RN Phone Number: 01/25/2023, 10:14 AM   Clinical Narrative: Patient to discharge to [facility] room [address]. Transportation arranged for 11 am. RN notified and to call report [phone].      Final next level of care: Skilled Nursing Facility Barriers to Discharge: Barriers Resolved   Patient Goals and CMS Choice CMS Medicare.gov Compare Post Acute Care list provided to::  (Patient reports that she would like to go home with suppor tof her friends.)    Discharge Placement                Patient chooses bed at: [facility] Patient to be transferred to facility by: [facility]   Patient and family notified of of transfer: 01/24/23  Discharge Plan and Services Additional resources added to the After Visit Summary for     Discharge Planning Services: CM Consult Post Acute Care Choice:  (Home with Home health v/s SNF)          DME Arranged: N/A DME Agency: NA       HH Arranged: NA HH Agency: NA        Social Determinants of Health (SDOH) Interventions SDOH Screenings   Food Insecurity: No Food Insecurity (01/10/2023)  Housing: Low Risk  (01/10/2023)  Transportation Needs: No Transportation Needs (01/10/2023)  Utilities: Not At Risk (01/10/2023)  Social Connections: Unknown (10/27/2021)   Received from [facility], [facility]  Tobacco Use: Low Risk  (01/16/2023)     Readmission Risk Interventions    01/20/2023    9:57 AM  Readmission Risk Prevention Plan  Transportation Screening Complete  PCP or Specialist Appt within 5-7 Days Complete  Home Care Screening Complete  Medication Review (RN CM) Complete

## 2023-01-25 NOTE — Telephone Encounter (Signed)
Patient is at [facility] now for rehab and they need to know if she has had her RSV or pneumonia vaccines. Can you check and see if they're in the old EMR?

## 2023-01-26 ENCOUNTER — Encounter: Payer: Self-pay | Admitting: Student

## 2023-01-26 ENCOUNTER — Non-Acute Institutional Stay (SKILLED_NURSING_FACILITY): Payer: Medicare Other | Admitting: Student

## 2023-01-26 DIAGNOSIS — Z66 Do not resuscitate: Secondary | ICD-10-CM | POA: Diagnosis not present

## 2023-01-26 DIAGNOSIS — J9601 Acute respiratory failure with hypoxia: Secondary | ICD-10-CM

## 2023-01-26 DIAGNOSIS — S065XAA Traumatic subdural hemorrhage with loss of consciousness status unknown, initial encounter: Secondary | ICD-10-CM

## 2023-01-26 DIAGNOSIS — I10 Essential (primary) hypertension: Secondary | ICD-10-CM

## 2023-01-26 DIAGNOSIS — E440 Moderate protein-calorie malnutrition: Secondary | ICD-10-CM

## 2023-01-26 DIAGNOSIS — N1831 Chronic kidney disease, stage 3a: Secondary | ICD-10-CM

## 2023-01-27 ENCOUNTER — Encounter: Payer: Self-pay | Admitting: Student

## 2023-01-31 ENCOUNTER — Ambulatory Visit: Payer: Medicare Other | Admitting: Cardiology

## 2023-02-03 LAB — CBC AND DIFFERENTIAL
HCT: 26 — AB (ref 36–46)
Hemoglobin: 7.9 — AB (ref 12.0–16.0)
Neutrophils Absolute: 6624
Platelets: 568 10*3/uL — AB (ref 150–400)
WBC: 9.2

## 2023-02-03 LAB — HEPATIC FUNCTION PANEL
ALT: 13 U/L (ref 7–35)
AST: 14 (ref 13–35)
Alkaline Phosphatase: 71 (ref 25–125)
Bilirubin, Total: 0.2

## 2023-02-03 LAB — COMPREHENSIVE METABOLIC PANEL
Albumin: 1.9 — AB (ref 3.5–5.0)
Calcium: 7 — AB (ref 8.7–10.7)
Globulin: 2
eGFR: 90

## 2023-02-03 LAB — CBC: RBC: 3.04 — AB (ref 3.87–5.11)

## 2023-02-03 LAB — BASIC METABOLIC PANEL
BUN: 11 (ref 4–21)
CO2: 28 — AB (ref 13–22)
Chloride: 100 (ref 99–108)
Creatinine: 0.4 — AB (ref 0.5–1.1)
Glucose: 69
Potassium: 3.2 meq/L — AB (ref 3.5–5.1)
Sodium: 137 (ref 137–147)

## 2023-02-07 ENCOUNTER — Non-Acute Institutional Stay (SKILLED_NURSING_FACILITY): Payer: Medicare Other | Admitting: Student

## 2023-02-07 ENCOUNTER — Encounter: Payer: Self-pay | Admitting: Student

## 2023-02-07 DIAGNOSIS — R112 Nausea with vomiting, unspecified: Secondary | ICD-10-CM | POA: Diagnosis not present

## 2023-02-07 NOTE — Progress Notes (Signed)
Location:  Other Nursing Home Room Number: [facility] [address]A Place of Service:  SNF [phone]) Provider:  Asa Lente, Gareth Morgan, NP (Inactive)  Patient Care Team: Orson Eva, NP (Inactive) as PCP - General (Nurse Practitioner)  Extended Emergency Contact Information Primary Emergency Contact: Gonzales,Hector Mobile Phone: [phone] Relation: Friend Secondary Emergency Contact: Gonzalez,Leslie Mobile Phone: [phone] Relation: Friend  Code Status:  DNR Goals of care: Advanced Directive information    01/26/2023    9:19 AM  Advanced Directives  Does Patient Have a Medical Advance Directive? No  Does patient want to make changes to medical advance directive? No - Patient declined     No chief complaint on file.   HPI:  Pt is a 89 y.o. female seen today for an acute visit for follow up of nausea and vomiting.   Patient had some n/v over the weekend. She had gone 2-3 days without a bowel movement. Her neighbor and HCPOA is at bedside and wonders when she would be able to go home. Discussed Dispo weighs heavily on progress with therapy.   Today patient is eating wel. Denies nausea/vomiting.   Patient is originally from [place].    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   transferred from [facility] to [facility] on 10/12/19, discharged from ED   Past Surgical History:  Procedure Laterality Date   CATARACT EXTRACTION W/PHACO Left 01/21/2020   Procedure: CATARACT EXTRACTION PHACO AND INTRAOCULAR LENS PLACEMENT (IOC) LEFT;  Surgeon: Nevada Crane, MD;  Location: [facility];  Service: Ophthalmology;  Laterality: Left;   CATARACT EXTRACTION W/PHACO Right 03/10/2020   Procedure: CATARACT EXTRACTION PHACO AND INTRAOCULAR LENS PLACEMENT (IOC) RIGHT 4.49  00:44.5;  Surgeon: Nevada Crane, MD;  Location: [facility];  Service: Ophthalmology;  Laterality: Right;    TOTAL HIP ARTHROPLASTY Right 2009    No Known Allergies  Outpatient Encounter Medications as of 02/07/2023  Medication Sig   aspirin 81 MG tablet Take 81 mg by mouth daily.   bisacodyl (DULCOLAX) 5 MG EC tablet Take 1 tablet (5 mg total) by mouth daily as needed for moderate constipation.   Calcium Carbonate 500 MG CHEW Chew 500 mg by mouth daily.   cholecalciferol (VITAMIN D3) 25 MCG (1000 UNIT) tablet Take 1,000 Units by mouth daily.   docusate sodium (COLACE) 100 MG capsule Take 1 capsule (100 mg total) by mouth 2 (two) times daily.   feeding supplement (ENSURE ENLIVE / ENSURE PLUS) LIQD Take 237 mLs by mouth 3 (three) times daily between meals.   ferrous sulfate 325 (65 FE) MG tablet Take 1 tablet (325 mg total) by mouth 2 (two) times daily with a meal.   furosemide (LASIX) 20 MG tablet Take 20 mg by mouth. Every 24 hours as needed   loratadine (CLARITIN) 10 MG tablet TAKE 1 TABLET BY MOUTH ONCE DAILY   Multiple Vitamin (MULTIVITAMIN WITH MINERALS) TABS tablet Take 1 tablet by mouth daily.   oxybutynin (DITROPAN) 5 MG tablet TAKE 1 TABLET BY MOUTH TWICE DAILY.   pantoprazole (PROTONIX) 40 MG tablet Take 1 tablet (40 mg total) by mouth daily.   senna (SENOKOT) 8.6 MG TABS tablet Take 1 tablet (8.6 mg total) by mouth 2 (two) times daily.   vitamin E (VITAMIN E) 180 MG (400 UNITS) capsule Take 400 Units by mouth daily.   Zinc Oxide (TRIPLE PASTE) 12.8 % ointment Apply  1 Application topically. Every shift.   No facility-administered encounter medications on file as of 02/07/2023.    Review of Systems  Immunization History  Administered Date(s) Administered   Moderna Sars-Covid-2 Vaccination 08/09/2019, 09/06/2019   Pertinent  Health Maintenance Due  Topic Date Due   DEXA SCAN  Never done   INFLUENZA VACCINE  01/13/2023      10/12/2019    3:01 PM 10/12/2019    9:03 PM 10/18/2019   12:11 AM 01/21/2020   10:53 AM 03/10/2020    7:53 AM  Fall Risk  (RETIRED) Patient Fall Risk Level  High fall risk High fall risk Moderate fall risk Moderate fall risk High fall risk   Functional Status Survey:    Vitals:   02/07/23 0930  BP: 117/71  Pulse: 100  Resp: 20  Temp: 98.4 F (36.9 C)  SpO2: 92%   There is no height or weight on file to calculate BMI. Physical Exam Constitutional:      Comments: Thin, edentulous  Cardiovascular:     Rate and Rhythm: Normal rate.     Pulses: Normal pulses.     Heart sounds: Murmur heard.  Pulmonary:     Effort: Pulmonary effort is normal.     Breath sounds: Normal breath sounds.  Psychiatric:        Mood and Affect: Mood normal.     Labs reviewed: Recent Labs    01/19/23 0623 01/20/23 0901 01/21/23 1012  NA 137 134* 137  K 3.4* 3.3* 3.8  CL 110 104 103  CO2 21* 23 25  GLUCOSE 86 88 97  BUN 28* 20 14  CREATININE 0.68 0.63 0.63  CALCIUM 7.5* 7.6* 8.1*   Recent Labs    01/11/23 0449 01/16/23 1148 01/17/23 0502  AST 18 20 14*  ALT 12 17 15   ALKPHOS 51 62 52  BILITOT 0.5 0.2* 0.7  PROT 3.0* 3.5* 3.4*  ALBUMIN <1.5* <1.5* <1.5*   Recent Labs    01/11/23 0449 01/16/23 1148 01/17/23 0502 01/18/23 0521 01/19/23 0623 01/20/23 0901 01/21/23 1012 01/23/23 0412  WBC 11.5* 26.6*   < > 16.4*   < > 11.4* 11.7* 9.3  NEUTROABS 8.2* 22.4*  --  12.9*  --   --   --   --   HGB 8.0* 9.0*   < > 6.4*   < > 9.1* 10.7* 8.3*  HCT 25.9* 27.9*   < > 20.2*   < > 28.0* 33.3* 25.8*  MCV 94.9 89.4   < > 88.6   < > 87.2 87.2 87.2  PLT 515* 972*   < > 569*   < > 548* 729* 491*   < > = values in this interval not displayed.   Lab Results  Component Value Date   TSH 2.780 08/02/2022   Lab Results  Component Value Date   HGBA1C 5.6 08/02/2022   Lab Results  Component Value Date   CHOL 190 08/02/2022   HDL 64 08/02/2022   LDLCALC 107 (H) 08/02/2022   TRIG 107 08/02/2022   CHOLHDL 3.0 08/02/2022    Significant Diagnostic Results in last 30 days:  DG Chest Port 1 View  Result Date: 01/19/2023 CLINICAL DATA:  Shortness of  breath EXAM: PORTABLE CHEST 1 VIEW COMPARISON:  Chest radiograph 01/16/2023 FINDINGS: The cardiomediastinal silhouette is stable, allowing for differences in patient rotation. Small to moderate-sized right and small left left pleural effusions are again seen, increased on the right since the prior study, with adjacent airspace  opacity in the lung bases. The upper lobes are clear. There is no pneumothorax There is no acute osseous abnormality. There is advanced degenerative change of both shoulders. The nonunited distal left clavicle fracture is unchanged. IMPRESSION: Right shoulder than left pleural effusions with adjacent airspace opacities which may reflect atelectasis or infection, slightly increased on the right since the prior study. Electronically Signed   By: Lesia Hausen M.D.   On: 01/19/2023 09:40   ECHOCARDIOGRAM COMPLETE  Result Date: 01/17/2023    ECHOCARDIOGRAM REPORT   Patient Name:   RUBY BELONGIA Date of Exam: 01/17/2023 Medical Rec #:  [id number]          Height:       67.0 in Accession #:    [id number]         Weight:       151.9 lb Date of Birth:  Jun 29, 1925         BSA:          1.799 m Patient Age:    89 years           BP:           114/68 mmHg Patient Gender: F                  HR:           92 bpm. Exam Location:  [facility] Procedure: 2D Echo, Cardiac Doppler and Color Doppler Indications:     CHF---acute systolic I50.21  History:         Patient has no prior history of Echocardiogram examinations.  Sonographer:     Cristela Blue Referring Phys:  [phone] Francoise Schaumann [place] Diagnosing Phys: Yvonne Kendall MD  Sonographer Comments: Technically challenging study due to limited acoustic windows and suboptimal parasternal window. IMPRESSIONS  1. Left ventricular ejection fraction, by estimation, is 55 to 60%. The left ventricle has normal function. The left ventricle has no regional wall motion abnormalities. Left ventricular diastolic parameters are consistent with Grade I diastolic dysfunction  (impaired relaxation).  2. Right ventricular systolic function is normal. The right ventricular size is normal. There is normal pulmonary artery systolic pressure.  3. The mitral valve is degenerative. Mild mitral valve regurgitation. No evidence of mitral stenosis.  4. Tricuspid valve regurgitation is mild to moderate.  5. The aortic valve was not well visualized. There is moderate calcification of the aortic valve. There is moderate thickening of the aortic valve. Aortic valve regurgitation is not visualized. Mild aortic valve stenosis.  6. Pulmonic valve regurgitation not well-assessed.  7. The inferior vena cava is normal in size with greater than 50% respiratory variability, suggesting right atrial pressure of 3 mmHg. FINDINGS  Left Ventricle: Left ventricular ejection fraction, by estimation, is 55 to 60%. The left ventricle has normal function. The left ventricle has no regional wall motion abnormalities. The left ventricular internal cavity size was normal in size. There is  borderline left ventricular hypertrophy. Left ventricular diastolic parameters are consistent with Grade I diastolic dysfunction (impaired relaxation). Right Ventricle: The right ventricular size is normal. No increase in right ventricular wall thickness. Right ventricular systolic function is normal. There is normal pulmonary artery systolic pressure. The tricuspid regurgitant velocity is 2.82 m/s, and  with an assumed right atrial pressure of 3 mmHg, the estimated right ventricular systolic pressure is 34.8 mmHg. Left Atrium: Left atrial size was normal in size. Right Atrium: Right atrial size was normal in size. Pericardium: There is no evidence of  pericardial effusion. Mitral Valve: The mitral valve is degenerative in appearance. There is mild thickening of the mitral valve leaflet(s). There is mild calcification of the mitral valve leaflet(s). Mild mitral annular calcification. Mild mitral valve regurgitation. No evidence of mitral  valve stenosis. MV peak gradient, 10.6 mmHg. The mean mitral valve gradient is 4.0 mmHg. Tricuspid Valve: The tricuspid valve is not well visualized. Tricuspid valve regurgitation is mild to moderate. Aortic Valve: The aortic valve was not well visualized. There is moderate calcification of the aortic valve. There is moderate thickening of the aortic valve. Aortic valve regurgitation is not visualized. Mild aortic stenosis is present. Aortic valve mean gradient measures 12.3 mmHg. Aortic valve peak gradient measures 22.0 mmHg. Aortic valve area, by VTI measures 2.24 cm. Pulmonic Valve: The pulmonic valve was not well visualized. Pulmonic valve regurgitation not well-assessed. Aorta: The aortic root is normal in size and structure. Venous: The inferior vena cava is normal in size with greater than 50% respiratory variability, suggesting right atrial pressure of 3 mmHg. IAS/Shunts: The interatrial septum was not well visualized.  LEFT VENTRICLE PLAX 2D LVIDd:         2.90 cm   Diastology LVIDs:         2.00 cm   LV e' medial:    6.74 cm/s LV PW:         0.90 cm   LV E/e' medial:  11.7 LV IVS:        1.10 cm   LV e' lateral:   7.40 cm/s LVOT diam:     1.90 cm   LV E/e' lateral: 10.7 LV SV:         88 LV SV Index:   49 LVOT Area:     2.84 cm  RIGHT VENTRICLE RV Basal diam:  3.40 cm RV Mid diam:    2.60 cm RV S prime:     15.00 cm/s TAPSE (M-mode): 2.1 cm LEFT ATRIUM           Index        RIGHT ATRIUM           Index LA diam:      3.30 cm 1.83 cm/m   RA Area:     11.20 cm LA Vol (A2C): 23.2 ml 12.90 ml/m  RA Volume:   23.90 ml  13.28 ml/m LA Vol (A4C): 8.6 ml  4.79 ml/m  AORTIC VALVE AV Area (Vmax):    1.79 cm AV Area (Vmean):   1.57 cm AV Area (VTI):     2.24 cm AV Vmax:           234.67 cm/s AV Vmean:          163.667 cm/s AV VTI:            0.393 m AV Peak Grad:      22.0 mmHg AV Mean Grad:      12.3 mmHg LVOT Vmax:         148.00 cm/s LVOT Vmean:        90.400 cm/s LVOT VTI:          0.310 m LVOT/AV VTI  ratio: 0.79  AORTA Ao Root diam: 2.70 cm MITRAL VALVE                TRICUSPID VALVE MV Area (PHT): 3.43 cm     TR Peak grad:   31.8 mmHg MV Area VTI:   3.37 cm     TR Vmax:  282.00 cm/s MV Peak grad:  10.6 mmHg MV Mean grad:  4.0 mmHg     SHUNTS MV Vmax:       1.63 m/s     Systemic VTI:  0.31 m MV Vmean:      96.0 cm/s    Systemic Diam: 1.90 cm MV Decel Time: 221 msec MV E velocity: 79.10 cm/s MV A velocity: 140.00 cm/s MV E/A ratio:  0.57 Yvonne Kendall MD Electronically signed by Yvonne Kendall MD Signature Date/Time: 01/17/2023/5:18:54 PM    Final    CT CHEST ABDOMEN PELVIS WO CONTRAST  Result Date: 01/16/2023 CLINICAL DATA:  Sepsis. EXAM: CT CHEST, ABDOMEN AND PELVIS WITHOUT CONTRAST TECHNIQUE: Multidetector CT imaging of the chest, abdomen and pelvis was performed following the standard protocol without IV contrast. RADIATION DOSE REDUCTION: This exam was performed according to the departmental dose-optimization program which includes automated exposure control, adjustment of the mA and/or kV according to patient size and/or use of iterative reconstruction technique. COMPARISON:  Chest x-ray from same day. Right upper quadrant ultrasound and CT abdomen pelvis dated January 10, 2023. FINDINGS: CT CHEST FINDINGS Cardiovascular: No significant vascular findings. Normal heart size. No pericardial effusion. No thoracic aortic aneurysm. Coronary, aortic arch, and branch vessel atherosclerotic vascular disease. Mediastinum/Nodes: No enlarged mediastinal, hilar, or axillary lymph nodes. Thyroid gland, trachea, and esophagus demonstrate no significant findings. Lungs/Pleura: Moderate bilateral pleural effusions. Dependent atelectasis in both posterior lower lobes. No consolidation or pneumothorax. Musculoskeletal: No acute or significant osseous findings. Nonunited left distal clavicle fracture, likely chronic. End-stage bilateral glenohumeral osteoarthritis. CT ABDOMEN PELVIS FINDINGS Hepatobiliary: No focal  liver abnormality. Unchanged large gallstone. No gallbladder wall thickening or biliary dilatation. Pancreas: Unremarkable. No pancreatic ductal dilatation or surrounding inflammatory changes. Spleen: Normal in size without focal abnormality. Adrenals/Urinary Tract: Adrenal glands are unremarkable. Unchanged 1.4 cm left renal simple cyst. No follow-up imaging is recommended. No renal calculi or hydronephrosis. The bladder is unremarkable. Stomach/Bowel: Stomach is within normal limits. Appendix appears normal. No evidence of bowel wall thickening, distention, or inflammatory changes. Sigmoid colonic diverticulosis. Vascular/Lymphatic: Aortic atherosclerosis. No enlarged abdominal or pelvic lymph nodes. Reproductive: Uterus and bilateral adnexa are unremarkable. Other: Small amount of free fluid in the pelvis. No pneumoperitoneum. Musculoskeletal: No acute or significant osseous findings. Mild anasarca. IMPRESSION: 1. Moderate bilateral pleural effusions with dependent atelectasis in both posterior lower lobes. 2. Small pelvic ascites. 3. Unchanged cholelithiasis. 4.  Aortic Atherosclerosis (ICD10-I70.0). Electronically Signed   By: Obie Dredge M.D.   On: 01/16/2023 14:28   DG Chest Port 1 View  Result Date: 01/16/2023 CLINICAL DATA:  Leukocytosis.  Possible sepsis. EXAM: PORTABLE CHEST 1 VIEW COMPARISON:  01/10/2023 FINDINGS: Low volume film with bibasilar atelectasis and small bilateral pleural effusions. No pulmonary edema. Cardiopericardial silhouette is at upper limits of normal for size. No acute bony abnormality. Degenerative changes noted in both shoulders with chronic posttraumatic deformity left distal clavicle, similar to prior. Telemetry leads overlie the chest. IMPRESSION: Low volume film with bibasilar atelectasis and small bilateral pleural effusions. Electronically Signed   By: Kennith Center M.D.   On: 01/16/2023 12:11   US Abdomen Limited RUQ (LIVER/GB)  Result Date: 01/10/2023 CLINICAL  DATA:  Gallbladder mass. EXAM: ULTRASOUND ABDOMEN LIMITED RIGHT UPPER QUADRANT COMPARISON:  None Available. FINDINGS: Gallbladder: A 1.3 cm x 1.0 cm x 0.9 cm partially calcified echogenic area is seen within the gallbladder lumen. Flow is not clearly identified within this region on color Doppler evaluation. The gallbladder wall measures 1.8 mm  in thickness. No sonographic Murphy sign noted by sonographer. Common bile duct: Diameter: 2.5 mm Liver: No focal lesion identified. Diffusely increased echogenicity of the liver parenchyma is noted. Portal vein is patent on color Doppler imaging with normal direction of blood flow towards the liver. Other: Limited study secondary to patient motion, as per the ultrasound technologist. IMPRESSION: 1. Findings suspicious for a partially calcified gallbladder mass. MRI correlation is recommended to further exclude the presence of an underlying neoplastic process. 2. Hepatic steatosis without focal liver lesions. Electronically Signed   By: Aram Candela M.D.   On: 01/10/2023 19:09   DG Chest Portable 1 View  Result Date: 01/10/2023 CLINICAL DATA:  weakness EXAM: PORTABLE CHEST 1 VIEW COMPARISON:  None Available. FINDINGS: Low lung volume. There are probable atelectatic changes at the left lung base with associated trace left pleural effusion. There is apparent blunting of right lateral costophrenic angle, which may be due to overlying soft tissue. Normal cardio-mediastinal silhouette. No acute osseous abnormalities. Old nonunited fracture of lateral left clavicle noted. Note is made of bilateral high riding humeri in relation to glenoid, concerning for complete rotator cuff tear. The soft tissues are within normal limits. IMPRESSION: Probable atelectatic changes at the left lung base with associated trace left pleural effusion. Electronically Signed   By: Jules Schick M.D.   On: 01/10/2023 15:51   CT ABDOMEN PELVIS W CONTRAST  Result Date: 01/10/2023 CLINICAL DATA:   abd pain EXAM: CT ABDOMEN AND PELVIS WITH CONTRAST TECHNIQUE: Multidetector CT imaging of the abdomen and pelvis was performed using the standard protocol following bolus administration of intravenous contrast. RADIATION DOSE REDUCTION: This exam was performed according to the departmental dose-optimization program which includes automated exposure control, adjustment of the mA and/or kV according to patient size and/or use of iterative reconstruction technique. CONTRAST:  75mL OMNIPAQUE IOHEXOL 300 MG/ML  SOLN COMPARISON:  None Available. FINDINGS: Lower chest: There are bilateral small pleural effusions. There are dependent changes in bilateral lungs. Bilateral lungs are otherwise clear. The heart is normal in size. No pericardial effusion. Hepatobiliary: The liver is normal in size. Non-cirrhotic configuration. No suspicious mass. These is mild diffuse hepatic steatosis. No intrahepatic or extrahepatic bile duct dilation. Physiologically distended gallbladder. There is a predominantly hyperattenuating approximately 2.6 x 3.1 cm rounded masslike area nearly completely filling the gallbladder lumen. There is central faint dystrophic calcification. This is incompletely characterized on the current examination and may represent tumefactive sludge; however, nonemergent right upper quadrant ultrasound is recommended to exclude mass lesion. No pericholecystic fat stranding. Pancreas: Unremarkable. No pancreatic ductal dilatation or surrounding inflammatory changes. Spleen: Within normal limits. No focal lesion. 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 Adrenals/Urinary Tract: Adrenal glands are unremarkable. No suspicious renal mass. There is a 1.4 x 1.8 cm simple cyst arising from the left kidney upper pole, laterally. No hydronephrosis. No renal or ureteric calculi. PLEASE NOTE THAT THE EVALUATION OF PELVIC VISCERA IS MILDLY LIMITED DUE TO STREAK  ARTIFACTS FROM RIGHT HIP PROSTHESIS. Urinary bladder is collapsed and not diagnostically evaluated due to extensive streak artifacts. Stomach/Bowel: Stomach is distended with fluid and dependent food material. There is a medium sized diverticulum arising from the second part of duodenum. No disproportionate dilation of the small or large bowel loops. No evidence of abnormal bowel wall thickening or inflammatory changes. The appendix is unremarkable. There are scattered diverticula mainly in the sigmoid colon, without imaging signs of diverticulitis. Moderate stool  burden noted. 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 No abnormal wall thickening or perirectal fat stranding. Vascular/Lymphatic: No ascites or pneumoperitoneum. No abdominal or pelvic lymphadenopathy, by size criteria. No aneurysmal dilation of the major abdominal arteries. There are mild peripheral atherosclerotic vascular calcifications of the aorta and its major branches. Reproductive: Not diagnostically evaluated due to extensive streak artifacts. Other: The visualized soft tissues and abdominal wall are unremarkable. Musculoskeletal: No suspicious osseous lesions. Right total hip arthroplasty noted. There are moderate multilevel degenerative changes in the visualized spine. IMPRESSION: 1. No acute inflammatory process within the abdomen or pelvis. 2. Rectal fecal impaction. Moderate stool burden. No bowel obstruction. 3. Masslike area in the gallbladder lumen. Nonemergent right upper quadrant ultrasound is recommended for further evaluation. 4. Bilateral small pleural effusions. 5. Multiple other nonacute observations, as described above. Electronically Signed   By: Jules Schick M.D.   On: 01/10/2023 13:28   DG Abdomen 1 View  Result Date: 01/10/2023 CLINICAL DATA:  Constipation. EXAM: ABDOMEN - 1 VIEW COMPARISON:  None Available. FINDINGS: The bowel gas pattern is normal. Large stool ball in the rectum.  Mildly increased colonic stool burden elsewhere. No radio-opaque calculi or other significant radiographic abnormality are seen. No acute osseous abnormality. Prior right hip arthroplasty. IMPRESSION: 1. Large stool ball in the rectum. Mildly increased colonic stool burden. Electronically Signed   By: Obie Dredge M.D.   On: 01/10/2023 12:05    Assessment/Plan Nausea and vomiting, unspecified vomiting type Patient presents with n/v today. No symptoms at this time. Encourage daily Bm to prevent. Patient is alert and oriented at this time. Recommend medication adherence for times of symptom. Patient and family reiterate DNR/DNH for patient.   Family/ staff Communication: nursing  Labs/tests ordered:  none

## 2023-02-16 ENCOUNTER — Telehealth: Payer: Medicare Other | Admitting: Nurse Practitioner

## 2023-02-16 NOTE — Telephone Encounter (Signed)
Facility nurse called and reported the patient c/o R+L heel pain, Tylenol at 4pm was not adequate for pain control. Reported the patient walked with therapy earlier today, no injury, skin intact, no redness/warmth, there is mild BLE swelling as usual. Recommended to try Tylenol 650mg  q6hr x 24 hours, observe the patient.

## 2023-02-17 LAB — BASIC METABOLIC PANEL: Potassium: 3.4 meq/L — AB (ref 3.5–5.1)

## 2023-02-25 ENCOUNTER — Non-Acute Institutional Stay (SKILLED_NURSING_FACILITY): Payer: Self-pay | Admitting: Student

## 2023-02-25 ENCOUNTER — Encounter: Payer: Self-pay | Admitting: Student

## 2023-02-25 DIAGNOSIS — I509 Heart failure, unspecified: Secondary | ICD-10-CM

## 2023-02-25 DIAGNOSIS — T39395A Adverse effect of other nonsteroidal anti-inflammatory drugs [NSAID], initial encounter: Secondary | ICD-10-CM

## 2023-02-25 DIAGNOSIS — E440 Moderate protein-calorie malnutrition: Secondary | ICD-10-CM

## 2023-02-25 DIAGNOSIS — E782 Mixed hyperlipidemia: Secondary | ICD-10-CM

## 2023-02-25 DIAGNOSIS — S065XAA Traumatic subdural hemorrhage with loss of consciousness status unknown, initial encounter: Secondary | ICD-10-CM

## 2023-02-25 DIAGNOSIS — N1831 Chronic kidney disease, stage 3a: Secondary | ICD-10-CM | POA: Diagnosis not present

## 2023-02-25 DIAGNOSIS — R112 Nausea with vomiting, unspecified: Secondary | ICD-10-CM | POA: Diagnosis not present

## 2023-02-25 DIAGNOSIS — I10 Essential (primary) hypertension: Secondary | ICD-10-CM

## 2023-02-25 DIAGNOSIS — K922 Gastrointestinal hemorrhage, unspecified: Secondary | ICD-10-CM

## 2023-02-25 NOTE — Progress Notes (Unsigned)
Location:  Other [place].  Nursing Home Room Number: [facility] [address] Place of Service:  SNF [phone]) Provider:  Dr. Earnestine Mealing  PCP: Orson Eva, NP (Inactive)  Patient Care Team: Orson Eva, NP (Inactive) as PCP - General (Nurse Practitioner)  Extended Emergency Contact Information Primary Emergency Contact: [facility] Phone: [phone] Relation: Friend Secondary Emergency Contact: Gonzalez,Leslie Mobile Phone: [phone] Relation: Friend  Code Status:  Full Code.  Goals of care: Advanced Directive information    02/28/2023    3:39 PM  Advanced Directives  Does Patient Have a Medical Advance Directive? No  Does patient want to make changes to medical advance directive? No - Patient declined     Chief Complaint  Patient presents with   Medical Management of Chronic Issues    Medical Management of Chronic Issues.     HPI:  Pt is a 89 y.o. female seen today for medical management of chronic diseases.    Patient is doing well and has no concerns today. Eating lunch with friends and family.    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   transferred from [facility] to [facility] on 10/12/19, discharged from ED   Past Surgical History:  Procedure Laterality Date   CATARACT EXTRACTION W/PHACO Left 01/21/2020   Procedure: CATARACT EXTRACTION PHACO AND INTRAOCULAR LENS PLACEMENT (IOC) LEFT;  Surgeon: Nevada Crane, MD;  Location: [facility];  Service: Ophthalmology;  Laterality: Left;   CATARACT EXTRACTION W/PHACO Right 03/10/2020   Procedure: CATARACT EXTRACTION PHACO AND INTRAOCULAR LENS PLACEMENT (IOC) RIGHT 4.49  00:44.5;  Surgeon: Nevada Crane, MD;  Location: [facility];  Service: Ophthalmology;  Laterality: Right;   TOTAL HIP ARTHROPLASTY Right 2009    No Known Allergies  Outpatient Encounter Medications as of 02/25/2023   Medication Sig   acetaminophen (TYLENOL) 325 MG tablet Take 650 mg by mouth every 6 (six) hours as needed.   acetaminophen (TYLENOL) 500 MG tablet Take 1,000 mg by mouth every 8 (eight) hours as needed.   aspirin 81 MG tablet Take 81 mg by mouth daily.   bisacodyl (DULCOLAX) 5 MG EC tablet Take 1 tablet (5 mg total) by mouth daily as needed for moderate constipation.   Calcium Carbonate 500 MG CHEW Chew 500 mg by mouth daily.   cholecalciferol (VITAMIN D3) 25 MCG (1000 UNIT) tablet Take 1,000 Units by mouth daily.   docusate sodium (COLACE) 100 MG capsule Take 1 capsule (100 mg total) by mouth 2 (two) times daily.   feeding supplement (ENSURE ENLIVE / ENSURE PLUS) LIQD Take 237 mLs by mouth 3 (three) times daily between meals.   ferrous sulfate 325 (65 FE) MG tablet Take 1 tablet (325 mg total) by mouth 2 (two) times daily with a meal.   furosemide (LASIX) 20 MG tablet Take 20 mg by mouth. Every 24 hours as needed   magnesium oxide (MAG-OX) 400 (240 Mg) MG tablet Take 400 mg by mouth 2 (two) times daily.   Multiple Vitamin (MULTIVITAMIN WITH MINERALS) TABS tablet Take 1 tablet by mouth daily.   ondansetron (ZOFRAN) 4 MG tablet Take 4 mg by mouth every 8 (eight) hours as needed for nausea or vomiting.   pantoprazole (PROTONIX) 40 MG tablet Take 1 tablet (40 mg total) by mouth daily.   polyethylene glycol (MIRALAX / GLYCOLAX) 17 g packet Take 17 g by mouth every other day.  senna (SENOKOT) 8.6 MG TABS tablet Take 1 tablet (8.6 mg total) by mouth 2 (two) times daily.   vitamin E (VITAMIN E) 180 MG (400 UNITS) capsule Take 400 Units by mouth daily.   Zinc Oxide (TRIPLE PASTE) 12.8 % ointment Apply 1 Application topically. Every shift.   [DISCONTINUED] loratadine (CLARITIN) 10 MG tablet TAKE 1 TABLET BY MOUTH ONCE DAILY   [DISCONTINUED] oxybutynin (DITROPAN) 5 MG tablet TAKE 1 TABLET BY MOUTH TWICE DAILY.   No facility-administered encounter medications on file as of 02/25/2023.    Review of  Systems  Immunization History  Administered Date(s) Administered   Moderna Sars-Covid-2 Vaccination 08/09/2019, 09/06/2019   Pertinent  Health Maintenance Due  Topic Date Due   DEXA SCAN  Never done   INFLUENZA VACCINE  Never done      10/12/2019    3:01 PM 10/12/2019    9:03 PM 10/18/2019   12:11 AM 01/21/2020   10:53 AM 03/10/2020    7:53 AM  Fall Risk  (RETIRED) Patient Fall Risk Level High fall risk High fall risk Moderate fall risk Moderate fall risk High fall risk   Functional Status Survey:    Vitals:   02/25/23 0926  BP: 131/75  Pulse: 84  Resp: 18  Temp: (!) 97.5 F (36.4 C)  SpO2: 95%  Weight: 132 lb 6.4 oz (60.1 kg)  Height: 5\' 7"  (1.702 m)   Body mass index is 20.74 kg/m. Physical Exam Constitutional:      Comments: Thin, frail  Cardiovascular:     Rate and Rhythm: Normal rate.     Pulses: Normal pulses.  Pulmonary:     Effort: Pulmonary effort is normal.  Abdominal:     General: Abdomen is flat.  Neurological:     Mental Status: She is alert.     Labs reviewed: Recent Labs    01/19/23 0623 01/20/23 0901 01/21/23 1012 02/03/23 0000 02/17/23 0000  NA 137 134* 137 137  --   K 3.4* 3.3* 3.8 3.2* 3.4*  CL 110 104 103 100  --   CO2 21* 23 25 28*  --   GLUCOSE 86 88 97  --   --   BUN 28* 20 14 11   --   CREATININE 0.68 0.63 0.63 0.4*  --   CALCIUM 7.5* 7.6* 8.1* 7.0*  --    Recent Labs    01/11/23 0449 01/16/23 1148 01/17/23 0502 02/03/23 0000  AST 18 20 14* 14  ALT 12 17 15 13   ALKPHOS 51 62 52 71  BILITOT 0.5 0.2* 0.7  --   PROT 3.0* 3.5* 3.4*  --   ALBUMIN <1.5* <1.5* <1.5* 1.9*   Recent Labs    01/16/23 1148 01/17/23 0502 01/18/23 0521 01/19/23 0623 01/20/23 0901 01/21/23 1012 01/23/23 0412 02/03/23 0000  WBC 26.6*   < > 16.4*   < > 11.4* 11.7* 9.3 9.2  NEUTROABS 22.4*  --  12.9*  --   --   --   --  6,624.00  HGB 9.0*   < > 6.4*   < > 9.1* 10.7* 8.3* 7.9*  HCT 27.9*   < > 20.2*   < > 28.0* 33.3* 25.8* 26*  MCV 89.4   <  > 88.6   < > 87.2 87.2 87.2  --   PLT 972*   < > 569*   < > 548* 729* 491* 568*   < > = values in this interval not displayed.   Lab Results  Component  Value Date   TSH 2.780 08/02/2022   Lab Results  Component Value Date   HGBA1C 5.6 08/02/2022   Lab Results  Component Value Date   CHOL 190 08/02/2022   HDL 64 08/02/2022   LDLCALC 107 (H) 08/02/2022   TRIG 107 08/02/2022   CHOLHDL 3.0 08/02/2022    Significant Diagnostic Results in last 30 days:  No results found.  Assessment/Plan Congestive heart failure, unspecified HF chronicity, unspecified heart failure type (HCC)  Subdural hematoma (HCC)  Stage 3a chronic kidney disease (HCC)  Nausea and vomiting, unspecified vomiting type  GI bleed due to NSAIDs  Malnutrition of moderate degree  Primary hypertension  Mixed hyperlipidemia Patient appears euvolemic on exam. Weight stable. Discontinued NSAID due to GI bleed. Subdural in 2021, no concerns at this time. No signs of bleeding at this time. Continue physical therapy and supportive care. Protein supplementation with meals as tolerated.   Family/ staff Communication: nursing  Labs/tests ordered:  none

## 2023-02-28 ENCOUNTER — Encounter: Payer: Self-pay | Admitting: Student

## 2023-02-28 ENCOUNTER — Non-Acute Institutional Stay (SKILLED_NURSING_FACILITY): Payer: Self-pay | Admitting: Student

## 2023-02-28 DIAGNOSIS — M25562 Pain in left knee: Secondary | ICD-10-CM

## 2023-02-28 DIAGNOSIS — G8929 Other chronic pain: Secondary | ICD-10-CM | POA: Diagnosis not present

## 2023-02-28 MED ORDER — TRAMADOL HCL 25 MG PO TABS
1.0000 | ORAL_TABLET | Freq: Four times a day (QID) | ORAL | 0 refills | Status: DC | PRN
Start: 2023-02-28 — End: 2023-03-24

## 2023-02-28 NOTE — Progress Notes (Unsigned)
Location:  Other [place].  Nursing Home Room Number: [facility] [address]A Place of Service:  SNF [phone]) Provider:  Dr. Earnestine Mealing  PCP: Orson Eva, NP (Inactive)  Patient Care Team: Orson Eva, NP (Inactive) as PCP - General (Nurse Practitioner)  Extended Emergency Contact Information Primary Emergency Contact: [facility] Phone: [phone] Relation: Friend Secondary Emergency Contact: Gonzalez,Leslie Mobile Phone: [phone] Relation: Friend  Code Status:  Full Code Goals of care: Advanced Directive information    02/28/2023    3:39 PM  Advanced Directives  Does Patient Have a Medical Advance Directive? No  Does patient want to make changes to medical advance directive? No - Patient declined     Chief Complaint  Patient presents with  . Acute Visit    Knee Pain.     HPI:  Pt is a 89 y.o. female seen today for an acute visit for Knee Pain.   She wants to know why people won't talk to her when she pays the bills to stay there. She has pain in the knee. It's not new.  Past Medical History:  Diagnosis Date  . Heart murmur   . High cholesterol   . Hypertension   . 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 CATARACT EXTRACTION W/PHACO Left 01/21/2020   Procedure: CATARACT EXTRACTION PHACO AND INTRAOCULAR LENS PLACEMENT (IOC) LEFT;  Surgeon: Nevada Crane, MD;  Location: [facility];  Service: Ophthalmology;  Laterality: Left;  . CATARACT EXTRACTION W/PHACO Right 03/10/2020   Procedure: CATARACT EXTRACTION PHACO AND INTRAOCULAR LENS PLACEMENT (IOC) RIGHT 4.49  00:44.5;  Surgeon: Nevada Crane, MD;  Location: [facility];  Service: Ophthalmology;  Laterality: Right;  . TOTAL HIP ARTHROPLASTY Right 2009    No Known Allergies  Outpatient Encounter Medications as of 02/28/2023  Medication Sig  .  acetaminophen (TYLENOL) 325 MG tablet Take 650 mg by mouth every 6 (six) hours as needed.  Marland Kitchen acetaminophen (TYLENOL) 500 MG tablet Take 1,000 mg by mouth every 8 (eight) hours as needed.  Marland Kitchen aspirin 81 MG tablet Take 81 mg by mouth daily.  . bisacodyl (DULCOLAX) 5 MG EC tablet Take 1 tablet (5 mg total) by mouth daily as needed for moderate constipation.  . Calcium Carbonate 500 MG CHEW Chew 500 mg by mouth daily.  . cholecalciferol (VITAMIN D3) 25 MCG (1000 UNIT) tablet Take 1,000 Units by mouth daily.  Marland Kitchen docusate sodium (COLACE) 100 MG capsule Take 1 capsule (100 mg total) by mouth 2 (two) times daily.  . feeding supplement (ENSURE ENLIVE / ENSURE PLUS) LIQD Take 237 mLs by mouth 3 (three) times daily between meals.  . ferrous sulfate 325 (65 FE) MG tablet Take 1 tablet (325 mg total) by mouth 2 (two) times daily with a meal.  . furosemide (LASIX) 20 MG tablet Take 20 mg by mouth. Every 24 hours as needed  . loratadine (CLARITIN) 10 MG tablet Take 10 mg by mouth daily.  . magnesium oxide (MAG-OX) 400 (240 Mg) MG tablet Take 400 mg by mouth 2 (two) times daily.  . Multiple Vitamin (MULTIVITAMIN WITH MINERALS) TABS tablet Take 1 tablet by mouth daily.  . ondansetron (ZOFRAN) 4 MG tablet Take 4 mg by mouth every 8 (eight) hours as needed for nausea or vomiting.  . pantoprazole (PROTONIX) 40 MG tablet Take 1 tablet (40 mg total) by mouth daily.  Marland Kitchen  polyethylene glycol (MIRALAX / GLYCOLAX) 17 g packet Take 17 g by mouth every other day.  . senna (SENOKOT) 8.6 MG TABS tablet Take 1 tablet (8.6 mg total) by mouth 2 (two) times daily.  . traMADol HCl 25 MG TABS Take 1 tablet by mouth every 8 (eight) hours as needed.  . vitamin E (VITAMIN E) 180 MG (400 UNITS) capsule Take 400 Units by mouth daily.  . Zinc Oxide (TRIPLE PASTE) 12.8 % ointment Apply 1 Application topically. Every shift.   No facility-administered encounter medications on file as of 02/28/2023.    Review of Systems  Immunization  History  Administered Date(s) Administered  . Moderna Sars-Covid-2 Vaccination 08/09/2019, 09/06/2019   Pertinent  Health Maintenance Due  Topic Date Due  . DEXA SCAN  Never done  . INFLUENZA VACCINE  Never done      10/12/2019    3:01 PM 10/12/2019    9:03 PM 10/18/2019   12:11 AM 01/21/2020   10:53 AM 03/10/2020    7:53 AM  Fall Risk  (RETIRED) Patient Fall Risk Level High fall risk High fall risk Moderate fall risk Moderate fall risk High fall risk   Functional Status Survey:    Vitals:   02/28/23 1458 02/28/23 1542  BP: (!) 150/84 (!) 150/76  Pulse: 99   Resp: 18   Temp: (!) 97.5 F (36.4 C)   SpO2: 99%   Weight: 132 lb 6.4 oz (60.1 kg)   Height: 5\' 7"  (1.702 m)    Body mass index is 20.74 kg/m. Physical Exam  Labs reviewed: Recent Labs    01/19/23 0623 01/20/23 0901 01/21/23 1012 02/03/23 0000 02/17/23 0000  NA 137 134* 137 137  --   K 3.4* 3.3* 3.8 3.2* 3.4*  CL 110 104 103 100  --   CO2 21* 23 25 28*  --   GLUCOSE 86 88 97  --   --   BUN 28* 20 14 11   --   CREATININE 0.68 0.63 0.63 0.4*  --   CALCIUM 7.5* 7.6* 8.1* 7.0*  --    Recent Labs    01/11/23 0449 01/16/23 1148 01/17/23 0502 02/03/23 0000  AST 18 20 14* 14  ALT 12 17 15 13   ALKPHOS 51 62 52 71  BILITOT 0.5 0.2* 0.7  --   PROT 3.0* 3.5* 3.4*  --   ALBUMIN <1.5* <1.5* <1.5* 1.9*   Recent Labs    01/16/23 1148 01/17/23 0502 01/18/23 0521 01/19/23 0623 01/20/23 0901 01/21/23 1012 01/23/23 0412 02/03/23 0000  WBC 26.6*   < > 16.4*   < > 11.4* 11.7* 9.3 9.2  NEUTROABS 22.4*  --  12.9*  --   --   --   --  6,624.00  HGB 9.0*   < > 6.4*   < > 9.1* 10.7* 8.3* 7.9*  HCT 27.9*   < > 20.2*   < > 28.0* 33.3* 25.8* 26*  MCV 89.4   < > 88.6   < > 87.2 87.2 87.2  --   PLT 972*   < > 569*   < > 548* 729* 491* 568*   < > = values in this interval not displayed.   Lab Results  Component Value Date   TSH 2.780 08/02/2022   Lab Results  Component Value Date   HGBA1C 5.6 08/02/2022   Lab  Results  Component Value Date   CHOL 190 08/02/2022   HDL 64 08/02/2022   LDLCALC 107 (H) 08/02/2022  TRIG 107 08/02/2022   CHOLHDL 3.0 08/02/2022    Significant Diagnostic Results in last 30 days:  No results found.  Assessment/Plan There are no diagnoses linked to this encounter.   Family/ staff Communication: ***  Labs/tests ordered:  ***

## 2023-03-24 ENCOUNTER — Non-Acute Institutional Stay (SKILLED_NURSING_FACILITY): Payer: Medicare Other | Admitting: Nurse Practitioner

## 2023-03-24 ENCOUNTER — Encounter: Payer: Self-pay | Admitting: Nurse Practitioner

## 2023-03-24 DIAGNOSIS — I509 Heart failure, unspecified: Secondary | ICD-10-CM

## 2023-03-24 DIAGNOSIS — I10 Essential (primary) hypertension: Secondary | ICD-10-CM | POA: Diagnosis not present

## 2023-03-24 DIAGNOSIS — K922 Gastrointestinal hemorrhage, unspecified: Secondary | ICD-10-CM | POA: Diagnosis not present

## 2023-03-24 DIAGNOSIS — R5381 Other malaise: Secondary | ICD-10-CM

## 2023-03-24 DIAGNOSIS — G8929 Other chronic pain: Secondary | ICD-10-CM

## 2023-03-24 DIAGNOSIS — M25562 Pain in left knee: Secondary | ICD-10-CM

## 2023-03-24 DIAGNOSIS — D649 Anemia, unspecified: Secondary | ICD-10-CM

## 2023-03-24 DIAGNOSIS — E440 Moderate protein-calorie malnutrition: Secondary | ICD-10-CM

## 2023-03-24 DIAGNOSIS — K219 Gastro-esophageal reflux disease without esophagitis: Secondary | ICD-10-CM

## 2023-03-24 DIAGNOSIS — T39395A Adverse effect of other nonsteroidal anti-inflammatory drugs [NSAID], initial encounter: Secondary | ICD-10-CM

## 2023-03-24 NOTE — Progress Notes (Signed)
Location:  Other Nursing Home Room Number: [facility] [address]A Place of Service:  SNF (31)  Orson Eva, NP  Patient Care Team: Orson Eva, NP as PCP - General (Nurse Practitioner)  Extended Emergency Contact Information Primary Emergency Contact: Gonzales,Hector Mobile Phone: [phone] Relation: Friend Secondary Emergency Contact: Gonzalez,Leslie Mobile Phone: [phone] Relation: Friend  Goals of care: Advanced Directive information    02/28/2023    3:39 PM  Advanced Directives  Does Patient Have a Medical Advance Directive? No  Does patient want to make changes to medical advance directive? No - Patient declined     Chief Complaint  Patient presents with   Medical Management of Chronic Issues    Medical Management of Chronic Issues.     HPI:  Pt is a 89 y.o. female seen today for medical management of chronic disease.  Pt reports she is planning on going home next week.  Staff working with her to get resources in places.  Staff reports she requires assistance with transfers, dressing, bathing.  She is incontinent of bladder and bladder and requires staff to help her change.  Pt reports she is not having any pain States she is doing well.    Anemia- on supplement   GERD- controlled on protonix   Constipation- controlled on senna and miralax  OA- on voltaren tablet but POA thought this had been stopped. 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   transferred from [facility] to [facility] on 10/12/19, discharged from ED   Past Surgical History:  Procedure Laterality Date   CATARACT EXTRACTION W/PHACO Left 01/21/2020   Procedure: CATARACT EXTRACTION PHACO AND INTRAOCULAR LENS PLACEMENT (IOC) LEFT;  Surgeon: Nevada Crane, MD;  Location: [facility];  Service: Ophthalmology;  Laterality: Left;    CATARACT EXTRACTION W/PHACO Right 03/10/2020   Procedure: CATARACT EXTRACTION PHACO AND INTRAOCULAR LENS PLACEMENT (IOC) RIGHT 4.49  00:44.5;  Surgeon: Nevada Crane, MD;  Location: [facility];  Service: Ophthalmology;  Laterality: Right;   TOTAL HIP ARTHROPLASTY Right 2009    No Known Allergies  Outpatient Encounter Medications as of 03/24/2023  Medication Sig   aspirin 81 MG tablet Take 81 mg by mouth daily.   bisacodyl (DULCOLAX) 5 MG EC tablet Take 1 tablet (5 mg total) by mouth daily as needed for moderate constipation.   Calcium Carbonate 500 MG CHEW Chew 500 mg by mouth daily.   cholecalciferol (VITAMIN D3) 25 MCG (1000 UNIT) tablet Take 25 mcg by mouth daily.   diclofenac (VOLTAREN) 75 MG EC tablet Take 75 mg by mouth 2 (two) times daily.   docusate sodium (COLACE) 100 MG capsule Take 1 capsule (100 mg total) by mouth 2 (two) times daily.   feeding supplement (ENSURE ENLIVE / ENSURE PLUS) LIQD Take 237 mLs by mouth 3 (three) times daily between meals.   ferrous sulfate 325 (65 FE) MG EC tablet Take 325 mg by mouth daily with breakfast. Every Monday, Wednesday and Friday   furosemide (LASIX) 20 MG tablet Take 20 mg by mouth. Every 24 hours as needed   loratadine (CLARITIN) 10 MG tablet Take 10 mg by mouth daily.   magnesium oxide (MAG-OX) 400 (240 Mg) MG tablet Take 400 mg by mouth 2 (two) times daily.   Multiple Vitamin (MULTIVITAMIN WITH MINERALS) TABS tablet Take 1 tablet by mouth  daily.   pantoprazole (PROTONIX) 40 MG tablet Take 1 tablet (40 mg total) by mouth daily.   polyethylene glycol (MIRALAX / GLYCOLAX) 17 g packet Take 17 g by mouth every other day.   senna (SENOKOT) 8.6 MG TABS tablet Take 1 tablet (8.6 mg total) by mouth 2 (two) times daily.   Zinc Oxide (TRIPLE PASTE) 12.8 % ointment Apply 1 Application topically. Every shift.   [DISCONTINUED] ferrous sulfate 325 (65 FE) MG tablet Take 1 tablet (325 mg total) by mouth 2 (two) times daily with a meal.  (Patient taking differently: Take 325 mg by mouth daily with breakfast. Every Monday,Wednesday and Friday)   acetaminophen (TYLENOL) 325 MG tablet Take 650 mg by mouth every 6 (six) hours as needed.   acetaminophen (TYLENOL) 500 MG tablet Take 1,000 mg by mouth every 8 (eight) hours as needed.   ondansetron (ZOFRAN) 4 MG tablet Take 4 mg by mouth every 8 (eight) hours as needed for nausea or vomiting.   vitamin E (VITAMIN E) 180 MG (400 UNITS) capsule Take 400 Units by mouth daily.   [DISCONTINUED] traMADol HCl 25 MG TABS Take 1 tablet by mouth every 6 (six) hours as needed. (Patient taking differently: Take 1 tablet by mouth every 8 (eight) hours as needed.)   No facility-administered encounter medications on file as of 03/24/2023.    Review of Systems  Constitutional:  Negative for activity change, appetite change, fatigue and unexpected weight change.  HENT:  Negative for congestion and hearing loss.   Eyes: Negative.   Respiratory:  Negative for cough and shortness of breath.   Cardiovascular:  Negative for chest pain, palpitations and leg swelling.  Gastrointestinal:  Negative for abdominal pain, constipation and diarrhea.  Genitourinary:  Negative for difficulty urinating and dysuria.  Musculoskeletal:  Negative for arthralgias and myalgias.  Skin:  Negative for color change and wound.  Neurological:  Negative for dizziness and weakness.  Psychiatric/Behavioral:  Negative for agitation, behavioral problems and confusion.      Immunization History  Administered Date(s) Administered   Moderna Sars-Covid-2 Vaccination 08/09/2019, 09/06/2019   Pertinent  Health Maintenance Due  Topic Date Due   DEXA SCAN  Never done   INFLUENZA VACCINE  Never done      10/12/2019    3:01 PM 10/12/2019    9:03 PM 10/18/2019   12:11 AM 01/21/2020   10:53 AM 03/10/2020    7:53 AM  Fall Risk  (RETIRED) Patient Fall Risk Level High fall risk High fall risk Moderate fall risk Moderate fall risk High  fall risk   Functional Status Survey:    Vitals:   03/24/23 1424  BP: (!) 143/84  Pulse: 88  Resp: 16  Temp: 97.8 F (36.6 C)  SpO2: 94%  Weight: 126 lb 3.2 oz (57.2 kg)  Height: 5\' 7"  (1.702 m)   Body mass index is 19.77 kg/m. Physical Exam Constitutional:      General: She is not in acute distress.    Appearance: She is well-developed. She is not diaphoretic.  HENT:     Head: Normocephalic and atraumatic.     Mouth/Throat:     Pharynx: No oropharyngeal exudate.  Eyes:     Conjunctiva/sclera: Conjunctivae normal.     Pupils: Pupils are equal, round, and reactive to light.  Cardiovascular:     Rate and Rhythm: Normal rate and regular rhythm.     Heart sounds: Normal heart sounds.  Pulmonary:     Effort: Pulmonary effort is normal.  Breath sounds: Normal breath sounds.  Abdominal:     General: Bowel sounds are normal.     Palpations: Abdomen is soft.  Musculoskeletal:     Cervical back: Normal range of motion and neck supple.     Right lower leg: No edema.     Left lower leg: No edema.  Skin:    General: Skin is warm and dry.  Neurological:     Mental Status: She is alert.  Psychiatric:        Mood and Affect: Mood normal.     Labs reviewed: Recent Labs    01/19/23 0623 01/20/23 0901 01/21/23 1012 02/03/23 0000 02/17/23 0000  NA 137 134* 137 137  --   K 3.4* 3.3* 3.8 3.2* 3.4*  CL 110 104 103 100  --   CO2 21* 23 25 28*  --   GLUCOSE 86 88 97  --   --   BUN 28* 20 14 11   --   CREATININE 0.68 0.63 0.63 0.4*  --   CALCIUM 7.5* 7.6* 8.1* 7.0*  --    Recent Labs    01/11/23 0449 01/16/23 1148 01/17/23 0502 02/03/23 0000  AST 18 20 14* 14  ALT 12 17 15 13   ALKPHOS 51 62 52 71  BILITOT 0.5 0.2* 0.7  --   PROT 3.0* 3.5* 3.4*  --   ALBUMIN <1.5* <1.5* <1.5* 1.9*   Recent Labs    01/16/23 1148 01/17/23 0502 01/18/23 0521 01/19/23 0623 01/20/23 0901 01/21/23 1012 01/23/23 0412 02/03/23 0000  WBC 26.6*   < > 16.4*   < > 11.4* 11.7* 9.3  9.2  NEUTROABS 22.4*  --  12.9*  --   --   --   --  6,624.00  HGB 9.0*   < > 6.4*   < > 9.1* 10.7* 8.3* 7.9*  HCT 27.9*   < > 20.2*   < > 28.0* 33.3* 25.8* 26*  MCV 89.4   < > 88.6   < > 87.2 87.2 87.2  --   PLT 972*   < > 569*   < > 548* 729* 491* 568*   < > = values in this interval not displayed.   Lab Results  Component Value Date   TSH 2.780 08/02/2022   Lab Results  Component Value Date   HGBA1C 5.6 08/02/2022   Lab Results  Component Value Date   CHOL 190 08/02/2022   HDL 64 08/02/2022   LDLCALC 107 (H) 08/02/2022   TRIG 107 08/02/2022   CHOLHDL 3.0 08/02/2022    Significant Diagnostic Results in last 30 days:  No results found.  Assessment/Plan 1. Chronic pain of left knee Recently got injection to knee, will dc voltaren, continue tylenol PRN  Will use voltaren gel PRN  2. GI bleed due to NSAIDs Stop voltaren   4. Primary hypertension Stable at this time  5. Congestive heart failure, unspecified HF chronicity, unspecified heart failure type (HCC) Stable, on lasix PRN  6. Gastroesophageal reflux disease without esophagitis Continues on protonix   7. Malnutrition of moderate degree -continue to offer 3 meals a day with supplement   8. Anemia, unspecified type -continues on iron, will follow up cbc prior to discharge  9. Debility Plans to go home next week, spoke with POA and planning on getting more help in the home to help with ADLs  Abdalrahman Clementson K. Biagio Borg [facility] [phone]

## 2023-03-24 NOTE — Progress Notes (Signed)
error 

## 2023-03-25 LAB — CBC AND DIFFERENTIAL
HCT: 26 — AB (ref 36–46)
Hemoglobin: 7.5 — AB (ref 12.0–16.0)
Neutrophils Absolute: 7351
Platelets: 615 10*3/uL — AB (ref 150–400)
WBC: 9.4

## 2023-03-25 LAB — CBC: RBC: 2.78 — AB (ref 3.87–5.11)

## 2023-03-28 ENCOUNTER — Encounter: Payer: Self-pay | Admitting: Adult Health

## 2023-03-28 ENCOUNTER — Non-Acute Institutional Stay (SKILLED_NURSING_FACILITY): Payer: Medicare Other | Admitting: Adult Health

## 2023-03-28 DIAGNOSIS — I10 Essential (primary) hypertension: Secondary | ICD-10-CM | POA: Diagnosis not present

## 2023-03-28 DIAGNOSIS — I5032 Chronic diastolic (congestive) heart failure: Secondary | ICD-10-CM

## 2023-03-28 DIAGNOSIS — D500 Iron deficiency anemia secondary to blood loss (chronic): Secondary | ICD-10-CM

## 2023-03-28 DIAGNOSIS — K5901 Slow transit constipation: Secondary | ICD-10-CM

## 2023-03-28 DIAGNOSIS — K219 Gastro-esophageal reflux disease without esophagitis: Secondary | ICD-10-CM

## 2023-03-28 DIAGNOSIS — M1712 Unilateral primary osteoarthritis, left knee: Secondary | ICD-10-CM

## 2023-03-28 DIAGNOSIS — R5381 Other malaise: Secondary | ICD-10-CM

## 2023-03-28 MED ORDER — POLYETHYLENE GLYCOL 3350 17 G PO PACK
17.0000 g | PACK | ORAL | Status: DC
Start: 2023-03-28 — End: 2023-12-12

## 2023-03-28 MED ORDER — MAGNESIUM OXIDE -MG SUPPLEMENT 400 (240 MG) MG PO TABS: 400.0000 mg | ORAL_TABLET | Freq: Two times a day (BID) | ORAL | 0 refills | Status: AC

## 2023-03-28 MED ORDER — SENNA 8.6 MG PO TABS
1.0000 | ORAL_TABLET | Freq: Two times a day (BID) | ORAL | Status: DC
Start: 2023-03-28 — End: 2023-12-12

## 2023-03-28 MED ORDER — LORATADINE 10 MG PO TABS: 10.0000 mg | ORAL_TABLET | Freq: Every day | ORAL | Status: DC

## 2023-03-28 MED ORDER — ACETAMINOPHEN 500 MG PO TABS
1000.0000 mg | ORAL_TABLET | Freq: Three times a day (TID) | ORAL | Status: AC | PRN
Start: 2023-03-28 — End: ?

## 2023-03-28 MED ORDER — ONDANSETRON HCL 4 MG PO TABS: 4.0000 mg | ORAL_TABLET | Freq: Three times a day (TID) | ORAL | 0 refills | Status: DC | PRN

## 2023-03-28 MED ORDER — DOCUSATE SODIUM 100 MG PO CAPS
100.0000 mg | ORAL_CAPSULE | Freq: Two times a day (BID) | ORAL | Status: DC
Start: 2023-03-28 — End: 2023-04-13

## 2023-03-28 MED ORDER — BISACODYL 5 MG PO TBEC
5.0000 mg | DELAYED_RELEASE_TABLET | Freq: Every day | ORAL | Status: DC | PRN
Start: 2023-03-28 — End: 2023-04-13

## 2023-03-28 MED ORDER — DICLOFENAC SODIUM 1 % EX GEL: 2.0000 g | Freq: Four times a day (QID) | CUTANEOUS | 0 refills | Status: AC | PRN

## 2023-03-28 MED ORDER — TRAMADOL HCL 50 MG PO TABS
25.0000 mg | ORAL_TABLET | Freq: Three times a day (TID) | ORAL | 0 refills | Status: AC | PRN
Start: 2023-03-28 — End: ?

## 2023-03-28 MED ORDER — VITAMIN D 25 MCG (1000 UNIT) PO TABS: 25.0000 ug | ORAL_TABLET | Freq: Every day | ORAL | Status: AC

## 2023-03-28 MED ORDER — FUROSEMIDE 20 MG PO TABS
20.0000 mg | ORAL_TABLET | Freq: Every day | ORAL | 0 refills | Status: DC | PRN
Start: 2023-03-28 — End: 2023-12-12

## 2023-03-28 MED ORDER — PANTOPRAZOLE SODIUM 40 MG PO TBEC: 40.0000 mg | DELAYED_RELEASE_TABLET | Freq: Every day | ORAL | 0 refills | Status: DC

## 2023-03-28 MED ORDER — FERROUS SULFATE 325 (65 FE) MG PO TBEC
325.0000 mg | DELAYED_RELEASE_TABLET | Freq: Every day | ORAL | Status: DC
Start: 2023-03-28 — End: 2023-04-13

## 2023-03-28 NOTE — Progress Notes (Signed)
Location:  Other [facility]) Nursing Home Room Number: [place] [facility] [facility]) Place of Service:  SNF (31) Provider:  Kenard Gower, DNP, FNP-BC  Patient Care Team: Orson Eva, NP as PCP - General (Nurse Practitioner)  Extended Emergency Contact Information Primary Emergency Contact: Gonzales,Hector Mobile Phone: [phone] Relation: Friend Secondary Emergency Contact: Gonzalez,Leslie Mobile Phone: [phone] Relation: Friend  Code Status:  Full Code  Goals of care: Advanced Directive information    03/28/2023   10:18 AM  Advanced Directives  Does Patient Have a Medical Advance Directive? No  Does patient want to make changes to medical advance directive? No - Patient declined  Would patient like information on creating a medical advance directive? No - Patient declined     Chief Complaint  Patient presents with   Acute Visit    for discharge    HPI:  Pt is a 89 y.o. [name] seen today for discharge home on 03/29/23 with [facility] PT and OT and DME:  shower chair.  She was admitted to [facility] on 01/26/23 post hospitalization 01/16/23 to 01/26/23. She has a PMH of hypertension and constipation. She was admitted with acute respiratory failure with hypoxia secondary to community acquired pneumonia and acute diastolic dysfunction CHF. She was empirically treated with Rocephin and Zithromax then switched to Augmentin to complete 7-day course of therapy. She was given Lasix 40 mg IV daily then changed to Lasix PO 20 mg every other day. She was weaned to room air on discharge.   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   transferred from [facility] to [facility] on 10/12/19, discharged from ED   Past Surgical History:  Procedure Laterality Date   CATARACT EXTRACTION W/PHACO Left 01/21/2020   Procedure: CATARACT EXTRACTION PHACO AND  INTRAOCULAR LENS PLACEMENT (IOC) LEFT;  Surgeon: Nevada Crane, MD;  Location: [facility];  Service: Ophthalmology;  Laterality: Left;   CATARACT EXTRACTION W/PHACO Right 03/10/2020   Procedure: CATARACT EXTRACTION PHACO AND INTRAOCULAR LENS PLACEMENT (IOC) RIGHT 4.49  00:44.5;  Surgeon: Nevada Crane, MD;  Location: [facility];  Service: Ophthalmology;  Laterality: Right;   TOTAL HIP ARTHROPLASTY Right 2009    No Known Allergies  Outpatient Encounter Medications as of 03/28/2023  Medication Sig   acetaminophen (TYLENOL) 325 MG tablet Take 650 mg by mouth every 6 (six) hours as needed.   acetaminophen (TYLENOL) 500 MG tablet Take 1,000 mg by mouth every 8 (eight) hours as needed.   aspirin 81 MG tablet Take 81 mg by mouth daily.   bisacodyl (DULCOLAX) 5 MG EC tablet Take 1 tablet (5 mg total) by mouth daily as needed for moderate constipation.   Calcium Carbonate 500 MG CHEW Chew 500 mg by mouth daily.   cholecalciferol (VITAMIN D3) 25 MCG (1000 UNIT) tablet Take 25 mcg by mouth daily.   diclofenac (VOLTAREN) 75 MG EC tablet Take 75 mg by mouth 2 (two) times daily.   docusate sodium (COLACE) 100 MG capsule Take 1 capsule (100 mg total) by mouth 2 (two) times daily.   feeding supplement (ENSURE ENLIVE / ENSURE PLUS) LIQD Take 237 mLs by mouth 3 (three) times daily between meals.   ferrous sulfate 325 (65 FE) MG EC tablet Take 325 mg by mouth daily with breakfast. Every Monday, Wednesday and Friday   furosemide (LASIX) 20 MG tablet Take 20  mg by mouth. Every 24 hours as needed   loratadine (CLARITIN) 10 MG tablet Take 10 mg by mouth daily.   magnesium oxide (MAG-OX) 400 (240 Mg) MG tablet Take 400 mg by mouth 2 (two) times daily.   Multiple Vitamin (MULTIVITAMIN WITH MINERALS) TABS tablet Take 1 tablet by mouth daily.   ondansetron (ZOFRAN) 4 MG tablet Take 4 mg by mouth every 8 (eight) hours as needed for nausea or vomiting.   pantoprazole (PROTONIX) 40 MG tablet  Take 1 tablet (40 mg total) by mouth daily.   polyethylene glycol (MIRALAX / GLYCOLAX) 17 g packet Take 17 g by mouth every other day.   senna (SENOKOT) 8.6 MG TABS tablet Take 1 tablet (8.6 mg total) by mouth 2 (two) times daily.   traMADol HCl 25 MG TABS Take 1 tablet by mouth every 8 (eight) hours as needed.   vitamin E (VITAMIN E) 180 MG (400 UNITS) capsule Take 400 Units by mouth daily.   Zinc Oxide (TRIPLE PASTE) 12.8 % ointment Apply 1 Application topically. Every shift.   No facility-administered encounter medications on file as of 03/28/2023.    Review of Systems  Constitutional:  Negative for appetite change, chills, fatigue and fever.  HENT:  Negative for congestion, hearing loss, rhinorrhea and sore throat.   Eyes: Negative.   Respiratory:  Negative for cough, shortness of breath and wheezing.   Cardiovascular:  Negative for chest pain, palpitations and leg swelling.  Gastrointestinal:  Negative for abdominal pain, constipation, diarrhea, nausea and vomiting.  Genitourinary:  Negative for dysuria.  Musculoskeletal:  Negative for arthralgias, back pain and myalgias.  Skin:  Negative for color change, rash and wound.  Neurological:  Negative for dizziness, weakness and headaches.  Psychiatric/Behavioral:  Negative for behavioral problems. The patient is not nervous/anxious.       Immunization History  Administered Date(s) Administered   Moderna Covid-19 Fall Seasonal Vaccine 60yrs & older 03/11/2023   Moderna Sars-Covid-2 Vaccination 08/09/2019, 09/06/2019   Pertinent  Health Maintenance Due  Topic Date Due   DEXA SCAN  Never done   INFLUENZA VACCINE  Never done      10/12/2019    3:01 PM 10/12/2019    9:03 PM 10/18/2019   12:11 AM 01/21/2020   10:53 AM 03/10/2020    7:53 AM  Fall Risk  (RETIRED) Patient Fall Risk Level High fall risk High fall risk Moderate fall risk Moderate fall risk High fall risk     Vitals:   03/28/23 0958  BP: 137/85  Pulse: 99  Resp: 14   Temp: 97.6 F (36.4 C)  SpO2: 96%  Weight: 126 lb 3.2 oz (57.2 kg)  Height: 5\' 7"  (1.702 m)   Body mass index is 19.77 kg/m.  Physical Exam Constitutional:      General: She is not in acute distress.    Appearance: Normal appearance.  HENT:     Head: Normocephalic and atraumatic.     Nose: Nose normal.     Mouth/Throat:     Mouth: Mucous membranes are moist.     Comments: Has upper dentures Eyes:     Conjunctiva/sclera: Conjunctivae normal.  Cardiovascular:     Rate and Rhythm: Normal rate and regular rhythm.  Pulmonary:     Effort: Pulmonary effort is normal.     Breath sounds: Normal breath sounds.  Abdominal:     General: Bowel sounds are normal.     Palpations: Abdomen is soft.  Musculoskeletal:  General: Normal range of motion.     Cervical back: Normal range of motion.     Comments: Has arthritic deformities of bilateral hand fingers  Skin:    General: Skin is warm and dry.  Neurological:     General: No focal deficit present.     Mental Status: She is alert and oriented to person, place, and time.  Psychiatric:        Mood and Affect: Mood normal.        Behavior: Behavior normal.      Labs reviewed: Recent Labs    01/19/23 0623 01/20/23 0901 01/21/23 1012 02/03/23 0000 02/17/23 0000  NA 137 134* 137 137  --   K 3.4* 3.3* 3.8 3.2* 3.4*  CL 110 104 103 100  --   CO2 21* 23 25 28*  --   GLUCOSE 86 88 97  --   --   BUN 28* 20 14 11   --   CREATININE 0.68 0.63 0.63 0.4*  --   CALCIUM 7.5* 7.6* 8.1* 7.0*  --    Recent Labs    01/11/23 0449 01/16/23 1148 01/17/23 0502 02/03/23 0000  AST 18 20 14* 14  ALT 12 17 15 13   ALKPHOS 51 62 52 71  BILITOT 0.5 0.2* 0.7  --   PROT 3.0* 3.5* 3.4*  --   ALBUMIN <1.5* <1.5* <1.5* 1.9*   Recent Labs    01/18/23 0521 01/19/23 0623 01/20/23 0901 01/21/23 1012 01/23/23 0412 02/03/23 0000 03/25/23 0000  WBC 16.4*   < > 11.4* 11.7* 9.3 9.2 9.4  NEUTROABS 12.9*  --   --   --   --  6,624.00  7,351.00  HGB 6.4*   < > 9.1* 10.7* 8.3* 7.9* 7.5*  HCT 20.2*   < > 28.0* 33.3* 25.8* 26* 26*  MCV 88.6   < > 87.2 87.2 87.2  --   --   PLT 569*   < > 548* 729* 491* 568* 615*   < > = values in this interval not displayed.   Lab Results  Component Value Date   TSH 2.780 08/02/2022   Lab Results  Component Value Date   HGBA1C 5.6 08/02/2022   Lab Results  Component Value Date   CHOL 190 08/02/2022   HDL 64 08/02/2022   LDLCALC 107 (H) 08/02/2022   TRIG 107 08/02/2022   CHOLHDL 3.0 08/02/2022    Significant Diagnostic Results in last 30 days:  No results found.  Assessment/Plan  1. Chronic diastolic (congestive) heart failure (HCC) -  no SOB - furosemide (LASIX) 20 MG tablet; Take 1 tablet (20 mg total) by mouth daily as needed. Every 24 hours as needed  Dispense: 30 tablet; Refill: 0  2. Iron deficiency anemia due to chronic blood loss Lab Results  Component Value Date   HGB 7.5 (A) 03/25/2023    -  thought to be due to an acute etiology -  Was transfused 1 unit PRBC on 01/18/23 -  continue Ferrous sulfate - ferrous sulfate 325 (65 FE) MG EC tablet; Take 1 tablet (325 mg total) by mouth daily with breakfast. Every Monday, Wednesday and Friday  3. Primary hypertension -  BP stable  4. Slow transit constipation - senna (SENOKOT) 8.6 MG TABS tablet; Take 1 tablet (8.6 mg total) by mouth 2 (two) times daily. - polyethylene glycol (MIRALAX / GLYCOLAX) 17 g packet; Take 17 g by mouth every other day. - docusate sodium (COLACE) 100 MG capsule; Take 1  capsule (100 mg total) by mouth 2 (two) times daily. - bisacodyl 5 MG EC tablet; Take 1 tablet (5 mg total) by mouth daily as needed for moderate constipation.  5. Gastroesophageal reflux disease without esophagitis - pantoprazole (PROTONIX) 40 MG tablet; Take 1 tablet (40 mg total) by mouth daily.  Dispense: 30 tablet; Refill: 0  6. Primary osteoarthritis of left knee - diclofenac Sodium (VOLTAREN) 1 % GEL; Apply 2 g  topically every 6 (six) hours as needed.  Dispense: 150 g; Refill: 0 - acetaminophen (TYLENOL) 500 MG tablet; Take 2 tablets (1,000 mg total) by mouth every 8 (eight) hours as needed. - cholecalciferol (VITAMIN D3) 25 MCG (1000 UNIT) tablet; Take 1 tablet (25 mcg total) by mouth daily. - traMADol (ULTRAM) 50 MG tablet; Take 0.5 tablets (25 mg total) by mouth every 8 (eight) hours as needed.  Dispense: 30 tablet; Refill: 0  7. Physical deconditioning -  for Home health PT and OT for therapeutic strengthening exercises -  fall precautions     I have e-prescribed medications.  Patient will have [facility] PT and OT.  DME provided: shower chair  Total discharge time: Greater than 30 minutes  Discharge time involved coordination of the discharge process with nursing staff.    Kenard Gower, DNP, MSN, FNP-BC [facility] [phone] (Monday-Friday 8:00 a.m. - 5:00 p.m.) [phone] (after hours)

## 2023-03-30 ENCOUNTER — Telehealth: Payer: Self-pay | Admitting: Cardiology

## 2023-03-30 NOTE — Telephone Encounter (Signed)
Thayer Ohm, PT with [facility], left VM reporting patient just got home from [facility] and he needs verbal orders for PT 1w9 and for a skilled nursing eval. Reports that patient is unsure what she is supposed to be taking as far as meds. States she is not taking what is on her list that she was d/c from [facility] and unsure what she should be taking.

## 2023-03-31 ENCOUNTER — Ambulatory Visit: Payer: Medicare Other | Admitting: Cardiology

## 2023-04-04 ENCOUNTER — Telehealth: Payer: Self-pay | Admitting: Cardiology

## 2023-04-04 NOTE — Telephone Encounter (Signed)
Need verbal order for wound dressing 1w4 and 2 PRNs. Verbal given.

## 2023-04-13 ENCOUNTER — Ambulatory Visit (INDEPENDENT_AMBULATORY_CARE_PROVIDER_SITE_OTHER): Payer: Medicare Other | Admitting: Cardiology

## 2023-04-13 ENCOUNTER — Encounter: Payer: Self-pay | Admitting: Cardiology

## 2023-04-13 VITALS — BP 108/68 | HR 69 | Ht 65.0 in | Wt 115.0 lb

## 2023-04-13 DIAGNOSIS — K5901 Slow transit constipation: Secondary | ICD-10-CM | POA: Diagnosis not present

## 2023-04-13 DIAGNOSIS — Z0130 Encounter for examination of blood pressure without abnormal findings: Secondary | ICD-10-CM

## 2023-04-13 DIAGNOSIS — R350 Frequency of micturition: Secondary | ICD-10-CM | POA: Diagnosis not present

## 2023-04-13 LAB — POCT URINALYSIS DIPSTICK
Bilirubin, UA: NEGATIVE
Blood, UA: NEGATIVE
Glucose, UA: NEGATIVE
Ketones, UA: NEGATIVE
Leukocytes, UA: NEGATIVE
Nitrite, UA: NEGATIVE
Protein, UA: POSITIVE — AB
Spec Grav, UA: 1.025 (ref 1.010–1.025)
Urobilinogen, UA: 0.2 U/dL
pH, UA: 6 (ref 5.0–8.0)

## 2023-04-13 MED ORDER — BISACODYL 5 MG PO TBEC
5.0000 mg | DELAYED_RELEASE_TABLET | Freq: Every day | ORAL | Status: DC | PRN
Start: 2023-04-13 — End: 2023-04-15

## 2023-04-13 NOTE — Progress Notes (Signed)
Established Patient Office Visit  Subjective:  Patient ID: Judy [name], female    DOB: [birth year]  Age: 89 y.o. MRN: [id number]  Chief Complaint  Patient presents with   Urinary Frequency    Patient in office complaining of frequent urination. Denies pain or burning. States last bowel movement was 3 days ago. Patient has Miralax and Senna on her medication list, not taking either. UA gives no indication of UTI, will send for culture. Recommend restarting Miralax for regular bowel movements.   Urinary Frequency  This is a new problem. The current episode started in the past 7 days. The problem occurs every urination. The problem has been unchanged. The patient is experiencing no pain. There has been no fever. She is Not sexually active. There is No history of pyelonephritis. Associated symptoms include frequency. Pertinent negatives include no flank pain or hematuria.    No other concerns at this time.   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   [facility] to [facility] on 10/12/19, discharged from ED    Past Surgical History:  Procedure Laterality Date   CATARACT EXTRACTION W/PHACO Left 01/21/2020   Procedure: CATARACT EXTRACTION PHACO AND INTRAOCULAR LENS PLACEMENT (IOC) LEFT;  Surgeon: Nevada Crane, MD;  Location: [facility];  Service: Ophthalmology;  Laterality: Left;   CATARACT EXTRACTION W/PHACO Right 03/10/2020   Procedure: CATARACT EXTRACTION PHACO AND INTRAOCULAR LENS PLACEMENT (IOC) RIGHT 4.49  00:44.5;  Surgeon: Nevada Crane, MD;  Location: [facility];  Service: Ophthalmology;  Laterality: Right;   TOTAL HIP ARTHROPLASTY Right 2009    Social History   Socioeconomic History   Marital status: Married    Spouse name: Not on file   Number of children: Not on file   Years of education: Not on file   Highest education level: Not on  file  Occupational History   Not on file  Tobacco Use   Smoking status: Never   Smokeless tobacco: Never  Substance and Sexual Activity   Alcohol use: No   Drug use: Never   Sexual activity: Not on file  Other Topics Concern   Not on file  Social History Narrative   Not on file   Social Determinants of Health   Financial Resource Strain: Not on file  Food Insecurity: No Food Insecurity (01/10/2023)   Hunger Vital Sign    Worried About Running Out of Food in the Last Year: Never true    Ran Out of Food in the Last Year: Never true  Transportation Needs: No Transportation Needs (01/10/2023)   PRAPARE - Administrator, Civil Service (Medical): No    Lack of Transportation (Non-Medical): No  Physical Activity: Not on file  Stress: Not on file  Social Connections: Unknown (10/27/2021)   Received from [facility]   Social Network    Social Network: Not on file  Intimate Partner Violence: Not At Risk (01/10/2023)   Humiliation, Afraid, Rape, and Kick questionnaire    Fear of Current or Ex-Partner: No    Emotionally Abused: No    Physically Abused: No    Sexually Abused: No    History reviewed. No pertinent family history.  No Known Allergies  Review of Systems  Constitutional: Negative.   HENT: Negative.    Eyes: Negative.   Respiratory: Negative.  Negative for shortness of breath.  Cardiovascular: Negative.  Negative for chest pain.  Gastrointestinal: Negative.  Negative for abdominal pain, constipation and diarrhea.  Genitourinary:  Positive for frequency. Negative for dysuria, flank pain and hematuria.  Musculoskeletal:  Negative for joint pain and myalgias.  Skin: Negative.   Neurological: Negative.  Negative for dizziness and headaches.  Endo/Heme/Allergies: Negative.   All other systems reviewed and are negative.      Objective:   BP 108/68   Pulse 69   Ht 5\' 5"  (1.651 m)   Wt 115 lb (52.2 kg)   SpO2 98%   BMI 19.14 kg/m    Vitals:   04/13/23 1011  BP: 108/68  Pulse: 69  Height: 5\' 5"  (1.651 m)  Weight: 115 lb (52.2 kg)  SpO2: 98%  BMI (Calculated): 19.14    Physical Exam Vitals and nursing note reviewed.  Constitutional:      Appearance: Normal appearance. She is normal weight.  HENT:     Head: Normocephalic and atraumatic.     Nose: Nose normal.     Mouth/Throat:     Mouth: Mucous membranes are moist.  Eyes:     Extraocular Movements: Extraocular movements intact.     Conjunctiva/sclera: Conjunctivae normal.     Pupils: Pupils are equal, round, and reactive to light.  Cardiovascular:     Rate and Rhythm: Normal rate and regular rhythm.     Pulses: Normal pulses.     Heart sounds: Normal heart sounds.  Pulmonary:     Effort: Pulmonary effort is normal.     Breath sounds: Normal breath sounds.  Abdominal:     General: Abdomen is flat. Bowel sounds are normal.     Palpations: Abdomen is soft.  Musculoskeletal:        General: Normal range of motion.     Cervical back: Normal range of motion.  Skin:    General: Skin is warm and dry.  Neurological:     General: No focal deficit present.     Mental Status: She is alert and oriented to person, place, and time.  Psychiatric:        Mood and Affect: Mood normal.        Behavior: Behavior normal.        Thought Content: Thought content normal.        Judgment: Judgment normal.      Results for orders placed or performed in visit on 04/13/23  POCT Urinalysis Dipstick (81002)  Result Value Ref Range   Color, UA yellow    Clarity, UA clear    Glucose, UA Negative Negative   Bilirubin, UA neg    Ketones, UA neg    Spec Grav, UA 1.025 1.010 - 1.025   Blood, UA neg    pH, UA 6.0 5.0 - 8.0   Protein, UA Positive (A) Negative   Urobilinogen, UA 0.2 0.2 or 1.0 E.U./dL   Nitrite, UA neg    Leukocytes, UA Negative Negative   Appearance clear    Odor no     Recent Results (from the past 2160 hour(s))  Lactic acid, plasma     Status:  None   Collection Time: 01/16/23 11:25 AM  Result Value Ref Range   Lactic Acid, Venous 1.9 0.5 - 1.9 mmol/L    Comment: Performed at [facility], [address] [place], [place] [address]  Brain natriuretic peptide     Status: None   Collection Time: 01/16/23 11:25 AM  Result Value Ref Range  B Natriuretic Peptide 57.0 0.0 - 100.0 pg/mL    Comment: Performed at [facility], [address] [place], [place] [address]  Comprehensive metabolic panel     Status: Abnormal   Collection Time: 01/16/23 11:48 AM  Result Value Ref Range   Sodium 136 135 - 145 mmol/L   Potassium 3.9 3.5 - 5.1 mmol/L   Chloride 108 98 - 111 mmol/L   CO2 18 (L) 22 - 32 mmol/L   Glucose, Bld 147 (H) 70 - 99 mg/dL    Comment: Glucose reference range applies only to samples taken after fasting for at least 8 hours.   BUN 39 (H) 8 - 23 mg/dL   Creatinine, Ser 2.95 (H) 0.44 - 1.00 mg/dL   Calcium 7.9 (L) 8.9 - 10.3 mg/dL   Total Protein 3.5 (L) 6.5 - 8.1 g/dL   Albumin <6.2 (L) 3.5 - 5.0 g/dL   AST 20 15 - 41 U/L   ALT 17 0 - 44 U/L   Alkaline Phosphatase 62 38 - 126 U/L   Total Bilirubin 0.2 (L) 0.3 - 1.2 mg/dL   GFR, Estimated 39 (L) >60 mL/min    Comment: (NOTE) Calculated using the CKD-EPI Creatinine Equation (2021)    Anion gap 10 5 - 15    Comment: Performed at [facility], [address] [place], [place] [address]  CBC with Differential     Status: Abnormal   Collection Time: 01/16/23 11:48 AM  Result Value Ref Range   WBC 26.6 (H) 4.0 - 10.5 K/uL   RBC 3.12 (L) 3.87 - 5.11 MIL/uL   Hemoglobin 9.0 (L) 12.0 - 15.0 g/dL   HCT 57.8 (L) 46.9 - 62.9 %   MCV 89.4 80.0 - 100.0 fL   MCH 28.8 26.0 - 34.0 pg   MCHC 32.3 30.0 - 36.0 g/dL   RDW 52.8 (H) 41.3 - 24.4 %   Platelets 972 (HH) 150 - 400 K/uL    Comment: REPEATED TO VERIFY PLATELET COUNT CONFIRMED BY SMEAR THIS CRITICAL RESULT HAS VERIFIED AND BEEN CALLED TO KATE MCCAFFETY BY PAULA MARY FERREE ON 08 04 2024 AT  1256, AND HAS BEEN READ BACK.     nRBC 0.5 (H) 0.0 - 0.2 %   Neutrophils Relative % 85 %   Neutro Abs 22.4 (H) 1.7 - 7.7 K/uL   Lymphocytes Relative 4 %   Lymphs Abs 1.2 0.7 - 4.0 K/uL   Monocytes Relative 9 %   Monocytes Absolute 2.5 (H) 0.1 - 1.0 K/uL   Eosinophils Relative 0 %   Eosinophils Absolute 0.1 0.0 - 0.5 K/uL   Basophils Relative 1 %   Basophils Absolute 0.1 0.0 - 0.1 K/uL   WBC Morphology INCREASED BANDS (>20% BANDS)    Smear Review PLATELETS APPEAR INCREASED    Immature Granulocytes 1 %   Abs Immature Granulocytes 0.33 (H) 0.00 - 0.07 K/uL   Acanthocytes PRESENT    Burr Cells PRESENT    Polychromasia PRESENT     Comment: Performed at [facility], [address] [place], [place] [address]  Blood Culture (routine x 2)     Status: None   Collection Time: 01/16/23 11:48 AM   Specimen: BLOOD  Result Value Ref Range   Specimen Description BLOOD LEFT ANTECUBITAL    Special Requests      BOTTLES DRAWN AEROBIC AND ANAEROBIC Blood Culture results may not be optimal due to an inadequate volume of blood received in culture bottles   Culture  NO GROWTH 5 DAYS Performed at [facility], [address] , [place] [address]    Report Status 01/21/2023 FINAL   Blood Culture (routine x 2)     Status: Abnormal   Collection Time: 01/16/23  2:45 PM   Specimen: BLOOD  Result Value Ref Range   Specimen Description      BLOOD BLOOD LEFT FOREARM Performed at [facility], [address] [place], [place] [address]    Special Requests      BOTTLES DRAWN AEROBIC AND ANAEROBIC Blood Culture adequate volume Performed at [facility], [address] [place], [place] [address]    Culture  Setup Time      GRAM POSITIVE COCCI IN CLUSTERS AEROBIC BOTTLE ONLY Organism ID to follow CRITICAL RESULT CALLED TO, READ BACK BY AND VERIFIED WITH: KRISTEN MERRILL 01/18/23 0931 MW    Culture (A)     KOCURIA SPECIES THE SIGNIFICANCE OF  ISOLATING THIS ORGANISM FROM A SINGLE SET OF BLOOD CULTURES WHEN MULTIPLE SETS ARE DRAWN IS UNCERTAIN. PLEASE NOTIFY THE MICROBIOLOGY DEPARTMENT WITHIN ONE WEEK IF SPECIATION AND SENSITIVITIES ARE REQUIRED. Performed at [facility], [address], [place], [place] [address]    Report Status 01/22/2023 FINAL   Blood Culture ID Panel (Reflexed)     Status: None   Collection Time: 01/16/23  2:45 PM  Result Value Ref Range   Enterococcus faecalis NOT DETECTED NOT DETECTED   Enterococcus Faecium NOT DETECTED NOT DETECTED   Listeria monocytogenes NOT DETECTED NOT DETECTED   Staphylococcus species NOT DETECTED NOT DETECTED   Staphylococcus aureus (BCID) NOT DETECTED NOT DETECTED   Staphylococcus epidermidis NOT DETECTED NOT DETECTED   Staphylococcus lugdunensis NOT DETECTED NOT DETECTED   Streptococcus species NOT DETECTED NOT DETECTED   Streptococcus agalactiae NOT DETECTED NOT DETECTED   Streptococcus pneumoniae NOT DETECTED NOT DETECTED   Streptococcus pyogenes NOT DETECTED NOT DETECTED   A.calcoaceticus-baumannii NOT DETECTED NOT DETECTED   Bacteroides fragilis NOT DETECTED NOT DETECTED   Enterobacterales NOT DETECTED NOT DETECTED   Enterobacter cloacae complex NOT DETECTED NOT DETECTED   Escherichia coli NOT DETECTED NOT DETECTED   Klebsiella aerogenes NOT DETECTED NOT DETECTED   Klebsiella oxytoca NOT DETECTED NOT DETECTED   Klebsiella pneumoniae NOT DETECTED NOT DETECTED   Proteus species NOT DETECTED NOT DETECTED   Salmonella species NOT DETECTED NOT DETECTED   Serratia marcescens NOT DETECTED NOT DETECTED   Haemophilus influenzae NOT DETECTED NOT DETECTED   Neisseria meningitidis NOT DETECTED NOT DETECTED   Pseudomonas aeruginosa NOT DETECTED NOT DETECTED   Stenotrophomonas maltophilia NOT DETECTED NOT DETECTED   Candida albicans NOT DETECTED NOT DETECTED   Candida auris NOT DETECTED NOT DETECTED   Candida glabrata NOT DETECTED NOT DETECTED   Candida krusei NOT DETECTED  NOT DETECTED   Candida parapsilosis NOT DETECTED NOT DETECTED   Candida tropicalis NOT DETECTED NOT DETECTED   Cryptococcus neoformans/gattii NOT DETECTED NOT DETECTED    Comment: Performed at [facility], [address] [place], [place] [address]  CBC     Status: Abnormal   Collection Time: 01/17/23  5:02 AM  Result Value Ref Range   WBC 27.8 (H) 4.0 - 10.5 K/uL   RBC 2.66 (L) 3.87 - 5.11 MIL/uL   Hemoglobin 7.7 (L) 12.0 - 15.0 g/dL   HCT 53.6 (L) 64.4 - 03.4 %   MCV 87.2 80.0 - 100.0 fL   MCH 28.9 26.0 - 34.0 pg   MCHC 33.2 30.0 -  36.0 g/dL   RDW 95.2 (H) 84.1 - 32.4 %   Platelets 743 (H) 150 - 400 K/uL   nRBC 0.3 (H) 0.0 - 0.2 %    Comment: Performed at [facility], [address] [place], [place] [address]  Comprehensive metabolic panel     Status: Abnormal   Collection Time: 01/17/23  5:02 AM  Result Value Ref Range   Sodium 137 135 - 145 mmol/L   Potassium 3.7 3.5 - 5.1 mmol/L   Chloride 110 98 - 111 mmol/L   CO2 17 (L) 22 - 32 mmol/L   Glucose, Bld 108 (H) 70 - 99 mg/dL    Comment: Glucose reference range applies only to samples taken after fasting for at least 8 hours.   BUN 46 (H) 8 - 23 mg/dL   Creatinine, Ser 7.25 (H) 0.44 - 1.00 mg/dL   Calcium 7.7 (L) 8.9 - 10.3 mg/dL   Total Protein 3.4 (L) 6.5 - 8.1 g/dL   Albumin <3.6 (L) 3.5 - 5.0 g/dL   AST 14 (L) 15 - 41 U/L   ALT 15 0 - 44 U/L   Alkaline Phosphatase 52 38 - 126 U/L   Total Bilirubin 0.7 0.3 - 1.2 mg/dL   GFR, Estimated 37 (L) >60 mL/min    Comment: (NOTE) Calculated using the CKD-EPI Creatinine Equation (2021)    Anion gap 10 5 - 15    Comment: Performed at [facility], [address] [place], [place] [address]  Urinalysis, w/ Reflex to Culture (Infection Suspected) -Urine, Clean Catch     Status: Abnormal   Collection Time: 01/17/23 10:59 AM  Result Value Ref Range   Specimen Source URINE, CLEAN CATCH    Color, Urine YELLOW (A) YELLOW   APPearance CLEAR (A) CLEAR    Specific Gravity, Urine 1.015 1.005 - 1.030   pH 5.0 5.0 - 8.0   Glucose, UA NEGATIVE NEGATIVE mg/dL   Hgb urine dipstick NEGATIVE NEGATIVE   Bilirubin Urine NEGATIVE NEGATIVE   Ketones, ur NEGATIVE NEGATIVE mg/dL   Protein, ur NEGATIVE NEGATIVE mg/dL   Nitrite NEGATIVE NEGATIVE   Leukocytes,Ua NEGATIVE NEGATIVE   RBC / HPF 0-5 0 - 5 RBC/hpf   WBC, UA 0-5 0 - 5 WBC/hpf    Comment:        Reflex urine culture not performed if WBC <=10, OR if Squamous epithelial cells >5. If Squamous epithelial cells >5 suggest recollection.    Bacteria, UA RARE (A) NONE SEEN   Squamous Epithelial / HPF 0-5 0 - 5 /HPF   Mucus PRESENT    Hyaline Casts, UA PRESENT     Comment: Performed at [facility], [address], [place], [place] [address]  Legionella Pneumophila Serogp 1 Ur Ag     Status: None   Collection Time: 01/17/23 10:59 AM  Result Value Ref Range   L. pneumophila Serogp 1 Ur Ag Negative Negative    Comment: (NOTE) Presumptive negative for L. pneumophila serogroup 1 antigen in urine, suggesting no recent or current infection. Legionnaires' disease cannot be ruled out since other serogroups and species may also cause disease. Performed At: [facility] [address] [place], [place] [id number] Jolene Schimke MD PP:[phone]    Source of Sample URINE, RANDOM     Comment: Performed at [facility], [address] [place], [place] [address]  Strep pneumoniae urinary antigen     Status: None   Collection Time: 01/17/23 10:59 AM  Result Value Ref  Range   Strep Pneumo Urinary Antigen NEGATIVE NEGATIVE    Comment:        Infection due to S. pneumoniae cannot be absolutely ruled out since the antigen present may be below the detection limit of the test. Performed at [facility], [address], [place], [place] [address]   ECHOCARDIOGRAM COMPLETE     Status: None   Collection Time: 01/17/23 12:49 PM  Result Value Ref Range   Weight 2,430.35  oz   BP 114/68 mmHg   Ao pk vel 2.35 m/s   AV Area VTI 2.24 cm2   AR max vel 1.79 cm2   AV Mean grad 12.3 mmHg   AV Peak grad 22.0 mmHg   S' Lateral 2.00 cm   AV Area mean vel 1.57 cm2   Area-P 1/2 3.43 cm2   MV VTI 3.37 cm2   Est EF 55 - 60%   CBC with Differential/Platelet     Status: Abnormal   Collection Time: 01/18/23  5:21 AM  Result Value Ref Range   WBC 16.4 (H) 4.0 - 10.5 K/uL   RBC 2.28 (L) 3.87 - 5.11 MIL/uL   Hemoglobin 6.4 (L) 12.0 - 15.0 g/dL   HCT 60.4 (L) 54.0 - 98.1 %   MCV 88.6 80.0 - 100.0 fL   MCH 28.1 26.0 - 34.0 pg   MCHC 31.7 30.0 - 36.0 g/dL   RDW 19.1 (H) 47.8 - 29.5 %   Platelets 569 (H) 150 - 400 K/uL   nRBC 0.2 0.0 - 0.2 %   Neutrophils Relative % 80 %   Neutro Abs 12.9 (H) 1.7 - 7.7 K/uL   Lymphocytes Relative 9 %   Lymphs Abs 1.4 0.7 - 4.0 K/uL   Monocytes Relative 7 %   Monocytes Absolute 1.2 (H) 0.1 - 1.0 K/uL   Eosinophils Relative 3 %   Eosinophils Absolute 0.5 0.0 - 0.5 K/uL   Basophils Relative 0 %   Basophils Absolute 0.0 0.0 - 0.1 K/uL   Immature Granulocytes 1 %   Abs Immature Granulocytes 0.21 (H) 0.00 - 0.07 K/uL    Comment: Performed at [facility], [address] [place], [place] [address]  Basic metabolic panel     Status: Abnormal   Collection Time: 01/18/23  5:21 AM  Result Value Ref Range   Sodium 137 135 - 145 mmol/L   Potassium 3.2 (L) 3.5 - 5.1 mmol/L   Chloride 110 98 - 111 mmol/L   CO2 20 (L) 22 - 32 mmol/L   Glucose, Bld 80 70 - 99 mg/dL    Comment: Glucose reference range applies only to samples taken after fasting for at least 8 hours.   BUN 41 (H) 8 - 23 mg/dL   Creatinine, Ser 8.65 (H) 0.44 - 1.00 mg/dL   Calcium 7.4 (L) 8.9 - 10.3 mg/dL   GFR, Estimated 48 (L) >60 mL/min    Comment: (NOTE) Calculated using the CKD-EPI Creatinine Equation (2021)    Anion gap 7 5 - 15    Comment: Performed at [facility], [address], [place], [place] [address]  ABO/Rh     Status: None    Collection Time: 01/18/23  5:21 AM  Result Value Ref Range   ABO/RH(D)      O POS Performed at [facility], [address], [place], [place] [address]   Iron and TIBC     Status: Abnormal   Collection Time: 01/18/23  5:21 AM  Result Value Ref Range   Iron 20 (L) 28 - 170 ug/dL   TIBC 409 (L) 811 - 914 ug/dL   Saturation Ratios 16 10.4 - 31.8 %   UIBC 103 ug/dL    Comment: Performed at [facility], [address] [place], [place] [address]  Prepare RBC (crossmatch)     Status: None   Collection Time: 01/18/23  8:05 AM  Result Value Ref Range   Order Confirmation      ORDER PROCESSED BY BLOOD BANK Performed at [facility], [address] [place], [place] [address]   Type and screen [facility]     Status: None   Collection Time: 01/18/23  8:40 AM  Result Value Ref Range   ABO/RH(D) O POS    Antibody Screen NEG    Sample Expiration 01/21/2023,2359    Unit Number [id number]    Blood Component Type RED CELLS,LR    Unit division 00    Status of Unit ISSUED,FINAL    Transfusion Status OK TO TRANSFUSE    Crossmatch Result      Compatible Performed at [facility], [address] [place], [place] [address]   BPAM RBC     Status: None   Collection Time: 01/18/23  8:40 AM  Result Value Ref Range   ISSUE DATE / TIME [id number]    Blood Product Unit Number [id number]    PRODUCT CODE [id number]    Unit Type and Rh 9500    Blood Product Expiration Date [id number]   CBC     Status: Abnormal   Collection Time: 01/19/23  6:23 AM  Result Value Ref Range   WBC 11.5 (H) 4.0 - 10.5 K/uL   RBC 2.99 (L) 3.87 - 5.11 MIL/uL   Hemoglobin 8.5 (L) 12.0 - 15.0 g/dL    Comment: REPEATED TO VERIFY   HCT 26.2 (L) 36.0 - 46.0 %   MCV 87.6 80.0 - 100.0 fL   MCH 28.4 26.0 - 34.0 pg   MCHC 32.4 30.0 - 36.0 g/dL   RDW 84.1 (H) 66.0 - 63.0 %   Platelets 539 (H) 150 - 400 K/uL   nRBC 0.2 0.0 - 0.2 %    Comment: Performed at  [facility], [address] [place], [place] [address]  Basic metabolic panel     Status: Abnormal   Collection Time: 01/19/23  6:23 AM  Result Value Ref Range   Sodium 137 135 - 145 mmol/L   Potassium 3.4 (L) 3.5 - 5.1 mmol/L   Chloride 110 98 - 111 mmol/L   CO2 21 (L) 22 - 32 mmol/L   Glucose, Bld 86 70 - 99 mg/dL    Comment: Glucose reference range applies only to samples taken after fasting for at least 8 hours.   BUN 28 (H) 8 - 23 mg/dL   Creatinine, Ser 9.32 0.44 - 1.00 mg/dL   Calcium 7.5 (L) 8.9 - 10.3 mg/dL   GFR, Estimated >35 >57 mL/min    Comment: (NOTE) Calculated using the CKD-EPI Creatinine Equation (2021)    Anion gap 6 5 - 15    Comment: Performed at [facility], [address] [place], [place] [address]  CBC     Status: Abnormal   Collection Time: 01/20/23  9:01 AM  Result Value Ref Range   WBC 11.4 (H) 4.0 - 10.5 K/uL   RBC 3.21 (L) 3.87 - 5.11 MIL/uL   Hemoglobin 9.1 (L) 12.0 - 15.0 g/dL  HCT 28.0 (L) 36.0 - 46.0 %   MCV 87.2 80.0 - 100.0 fL   MCH 28.3 26.0 - 34.0 pg   MCHC 32.5 30.0 - 36.0 g/dL   RDW 16.1 (H) 09.6 - 04.5 %   Platelets 548 (H) 150 - 400 K/uL   nRBC 0.0 0.0 - 0.2 %    Comment: Performed at [facility], [address] [place], [place] [address]  Basic metabolic panel     Status: Abnormal   Collection Time: 01/20/23  9:01 AM  Result Value Ref Range   Sodium 134 (L) 135 - 145 mmol/L   Potassium 3.3 (L) 3.5 - 5.1 mmol/L   Chloride 104 98 - 111 mmol/L   CO2 23 22 - 32 mmol/L   Glucose, Bld 88 70 - 99 mg/dL    Comment: Glucose reference range applies only to samples taken after fasting for at least 8 hours.   BUN 20 8 - 23 mg/dL   Creatinine, Ser 1.91 0.44 - 1.00 mg/dL   Calcium 7.6 (L) 8.9 - 10.3 mg/dL   GFR, Estimated >47 >82 mL/min    Comment: (NOTE) Calculated using the CKD-EPI Creatinine Equation (2021)    Anion gap 7 5 - 15    Comment: Performed at [facility], [address]  [place], [place] [address]  Occult blood card to lab, stool     Status: Abnormal   Collection Time: 01/20/23 10:47 AM  Result Value Ref Range   Fecal Occult Bld POSITIVE (A) NEGATIVE    Comment: Performed at [facility], [address] [place], [place] [address]  CBC     Status: Abnormal   Collection Time: 01/21/23 10:12 AM  Result Value Ref Range   WBC 11.7 (H) 4.0 - 10.5 K/uL   RBC 3.82 (L) 3.87 - 5.11 MIL/uL   Hemoglobin 10.7 (L) 12.0 - 15.0 g/dL   HCT 78.4 (L) 69.6 - 29.5 %   MCV 87.2 80.0 - 100.0 fL   MCH 28.0 26.0 - 34.0 pg   MCHC 32.1 30.0 - 36.0 g/dL   RDW 28.4 (H) 13.2 - 44.0 %   Platelets 729 (H) 150 - 400 K/uL   nRBC 0.0 0.0 - 0.2 %    Comment: Performed at [facility], [address] [place], [place] [address]  Basic metabolic panel     Status: Abnormal   Collection Time: 01/21/23 10:12 AM  Result Value Ref Range   Sodium 137 135 - 145 mmol/L   Potassium 3.8 3.5 - 5.1 mmol/L   Chloride 103 98 - 111 mmol/L   CO2 25 22 - 32 mmol/L   Glucose, Bld 97 70 - 99 mg/dL    Comment: Glucose reference range applies only to samples taken after fasting for at least 8 hours.   BUN 14 8 - 23 mg/dL   Creatinine, Ser 5.36 0.44 - 1.00 mg/dL   Calcium 8.1 (L) 8.9 - 10.3 mg/dL   GFR, Estimated >64 >40 mL/min    Comment: (NOTE) Calculated using the CKD-EPI Creatinine Equation (2021)    Anion gap 9 5 - 15    Comment: Performed at [facility], [address] [place], [place] [address]  Urinalysis, Routine w reflex microscopic -Urine, Clean Catch     Status: Abnormal   Collection Time: 01/22/23  8:25 AM  Result Value Ref Range   Color, Urine STRAW (A) YELLOW   APPearance CLEAR (A) CLEAR   Specific Gravity, Urine 1.006 1.005 -  1.030   pH 7.0 5.0 - 8.0   Glucose, UA NEGATIVE NEGATIVE mg/dL   Hgb urine dipstick MODERATE (A) NEGATIVE   Bilirubin Urine NEGATIVE NEGATIVE   Ketones, ur NEGATIVE NEGATIVE mg/dL   Protein, ur NEGATIVE NEGATIVE mg/dL   Nitrite  NEGATIVE NEGATIVE   Leukocytes,Ua NEGATIVE NEGATIVE   RBC / HPF 0-5 0 - 5 RBC/hpf   WBC, UA 0-5 0 - 5 WBC/hpf   Bacteria, UA RARE (A) NONE SEEN   Squamous Epithelial / HPF 0-5 0 - 5 /HPF   Mucus PRESENT     Comment: Performed at [facility], [address] [place], [place] [address]  CBC     Status: Abnormal   Collection Time: 01/23/23  4:12 AM  Result Value Ref Range   WBC 9.3 4.0 - 10.5 K/uL   RBC 2.96 (L) 3.87 - 5.11 MIL/uL   Hemoglobin 8.3 (L) 12.0 - 15.0 g/dL   HCT 14.7 (L) 82.9 - 56.2 %   MCV 87.2 80.0 - 100.0 fL   MCH 28.0 26.0 - 34.0 pg   MCHC 32.2 30.0 - 36.0 g/dL   RDW 13.0 86.5 - 78.4 %   Platelets 491 (H) 150 - 400 K/uL   nRBC 0.0 0.0 - 0.2 %    Comment: Performed at [facility], [address] [place], [place] [address]  CBC and differential     Status: Abnormal   Collection Time: 02/03/23 12:00 AM  Result Value Ref Range   Hemoglobin 7.9 (A) 12.0 - 16.0   HCT 26 (A) 36 - 46   Neutrophils Absolute 6,624.00    Platelets 568 (A) 150 - 400 K/uL   WBC 9.2   CBC     Status: Abnormal   Collection Time: 02/03/23 12:00 AM  Result Value Ref Range   RBC 3.04 (A) 3.87 - 5.11  Basic metabolic panel     Status: Abnormal   Collection Time: 02/03/23 12:00 AM  Result Value Ref Range   Glucose 69    BUN 11 4 - 21   CO2 28 (A) 13 - 22   Creatinine 0.4 (A) 0.5 - 1.1   Potassium 3.2 (A) 3.5 - 5.1 mEq/L   Sodium 137 137 - 147   Chloride 100 99 - 108  Comprehensive metabolic panel     Status: Abnormal   Collection Time: 02/03/23 12:00 AM  Result Value Ref Range   Globulin 2.0    eGFR 90    Calcium 7.0 (A) 8.7 - 10.7   Albumin 1.9 (A) 3.5 - 5.0  Hepatic function panel     Status: None   Collection Time: 02/03/23 12:00 AM  Result Value Ref Range   Alkaline Phosphatase 71 25 - 125   ALT 13 7 - 35 U/L   AST 14 13 - 35   Bilirubin, Total 0.2   Basic metabolic panel     Status: Abnormal   Collection Time: 02/17/23 12:00 AM  Result Value Ref Range    Potassium 3.4 (A) 3.5 - 5.1 mEq/L  CBC and differential     Status: Abnormal   Collection Time: 03/25/23 12:00 AM  Result Value Ref Range   Hemoglobin 7.5 (A) 12.0 - 16.0   HCT 26 (A) 36 - 46   Neutrophils Absolute 7,351.00    Platelets 615 (A) 150 - 400 K/uL   WBC 9.4   CBC     Status: Abnormal   Collection Time: 03/25/23 12:00 AM  Result Value  Ref Range   RBC 2.78 (A) 3.87 - 5.11  POCT Urinalysis Dipstick (16109)     Status: Abnormal   Collection Time: 04/13/23 10:29 AM  Result Value Ref Range   Color, UA yellow    Clarity, UA clear    Glucose, UA Negative Negative   Bilirubin, UA neg    Ketones, UA neg    Spec Grav, UA 1.025 1.010 - 1.025   Blood, UA neg    pH, UA 6.0 5.0 - 8.0   Protein, UA Positive (A) Negative   Urobilinogen, UA 0.2 0.2 or 1.0 E.U./dL   Nitrite, UA neg    Leukocytes, UA Negative Negative   Appearance clear    Odor no       Assessment & Plan:  Culture urine Restart Miralax for regular bowel movements.   Problem List Items Addressed This Visit       Other   Urinary frequency - Primary   Relevant Orders   POCT Urinalysis Dipstick (60454) (Completed)   Culture, Urine   Other Visit Diagnoses     Slow transit constipation       Relevant Medications   bisacodyl 5 MG EC tablet       Return in about 4 weeks (around 05/11/2023).   Total time spent: 25 minutes  Google, NP  04/13/2023   This document may have been prepared by Dragon Voice Recognition software and as such may include unintentional dictation errors.

## 2023-04-15 ENCOUNTER — Other Ambulatory Visit: Payer: Self-pay

## 2023-04-15 DIAGNOSIS — K5901 Slow transit constipation: Secondary | ICD-10-CM

## 2023-04-15 LAB — URINE CULTURE

## 2023-04-15 MED ORDER — BISACODYL 5 MG PO TBEC: 5.0000 mg | DELAYED_RELEASE_TABLET | Freq: Every day | ORAL | 3 refills | Status: DC | PRN

## 2023-05-09 ENCOUNTER — Ambulatory Visit (INDEPENDENT_AMBULATORY_CARE_PROVIDER_SITE_OTHER): Payer: Medicare Other | Admitting: Podiatry

## 2023-05-09 ENCOUNTER — Encounter: Payer: Self-pay | Admitting: Podiatry

## 2023-05-09 DIAGNOSIS — B351 Tinea unguium: Secondary | ICD-10-CM | POA: Diagnosis not present

## 2023-05-09 DIAGNOSIS — M79675 Pain in left toe(s): Secondary | ICD-10-CM

## 2023-05-09 DIAGNOSIS — M79674 Pain in right toe(s): Secondary | ICD-10-CM | POA: Diagnosis not present

## 2023-05-09 NOTE — Progress Notes (Signed)
  Subjective:  Patient ID: Judy [name], female    DOB: [birth year],  MRN: [id number]  Severely thickened nails and calluses  89 y.o. female presents with the above complaint. History confirmed with patient.  Nails are quite painful again.  Regular debridement has been helpful.  She has not seen me in several months because she was hospitalized recently.  Objective:  Physical Exam: warm, good capillary refill, no trophic changes or ulcerative lesions, normal DP and PT pulses, normal sensory exam, and severe contracted hammertoe and pes planus deformity there is severe onychomycosis and dystrophy of all nails.  Yellow discoloration and subungual debris  Assessment:   1. Pain due to onychomycosis of toenails of both feet      Plan:  Patient was evaluated and treated and all questions answered.  Discussed the etiology and treatment options for the condition in detail with the patient.  Not a good candidate for oral or topical treatment.  Intermittent treatment has been helpful in reducing pain and improving function.. Recommended debridement of the nails today. Sharp and mechanical debridement performed of all painful and mycotic nails today. Nails debrided in length and thickness using a nail nipper to level of comfort. Discussed treatment options including appropriate shoe gear. Follow up as scheduled for painful nails.    Return in about 10 weeks (around 07/18/2023) for painful thick fungal nails.

## 2023-05-13 ENCOUNTER — Ambulatory Visit: Payer: Medicare Other | Admitting: Cardiology

## 2023-05-16 ENCOUNTER — Encounter: Payer: Self-pay | Admitting: Cardiology

## 2023-05-16 ENCOUNTER — Ambulatory Visit (INDEPENDENT_AMBULATORY_CARE_PROVIDER_SITE_OTHER): Payer: Medicare Other | Admitting: Cardiology

## 2023-05-16 VITALS — BP 132/76 | HR 96 | Ht 65.0 in | Wt 122.2 lb

## 2023-05-16 DIAGNOSIS — I10 Essential (primary) hypertension: Secondary | ICD-10-CM

## 2023-05-16 DIAGNOSIS — M25472 Effusion, left ankle: Secondary | ICD-10-CM | POA: Diagnosis not present

## 2023-05-16 NOTE — Patient Instructions (Signed)
Take an extra furosemide every morning for 3 days.

## 2023-05-16 NOTE — Progress Notes (Signed)
Established Patient Office Visit  Subjective:  Patient ID: Judy [name], female    DOB: [birth year]  Age: 89 y.o. MRN: [id number]  Chief Complaint  Patient presents with   Follow-up    4 week follow up    Patient in office for 4 week follow up. Patient doing well. Patient complaining of left ankle and foot edema. No recent trauma to ankle. Patient taking furosemide. Recommend increasing furosemide to 40 mg for 3 days only.     No other concerns at this time.   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   transferred from [facility] to [facility] on 10/12/19, discharged from ED    Past Surgical History:  Procedure Laterality Date   CATARACT EXTRACTION W/PHACO Left 01/21/2020   Procedure: CATARACT EXTRACTION PHACO AND INTRAOCULAR LENS PLACEMENT (IOC) LEFT;  Surgeon: Nevada Crane, MD;  Location: [facility];  Service: Ophthalmology;  Laterality: Left;   CATARACT EXTRACTION W/PHACO Right 03/10/2020   Procedure: CATARACT EXTRACTION PHACO AND INTRAOCULAR LENS PLACEMENT (IOC) RIGHT 4.49  00:44.5;  Surgeon: Nevada Crane, MD;  Location: [facility];  Service: Ophthalmology;  Laterality: Right;   TOTAL HIP ARTHROPLASTY Right 2009    Social History   Socioeconomic History   Marital status: Married    Spouse name: Not on file   Number of children: Not on file   Years of education: Not on file   Highest education level: Not on file  Occupational History   Not on file  Tobacco Use   Smoking status: Never   Smokeless tobacco: Never  Substance and Sexual Activity   Alcohol use: No   Drug use: Never   Sexual activity: Not on file  Other Topics Concern   Not on file  Social History Narrative   Not on file   Social Determinants of Health   Financial Resource Strain: Not on file  Food Insecurity: No Food Insecurity (01/10/2023)   Hunger Vital Sign    Worried About  Running Out of Food in the Last Year: Never true    Ran Out of Food in the Last Year: Never true  Transportation Needs: No Transportation Needs (01/10/2023)   PRAPARE - Administrator, Civil Service (Medical): No    Lack of Transportation (Non-Medical): No  Physical Activity: Not on file  Stress: Not on file  Social Connections: Unknown (10/27/2021)   Received from [facility], [facility]   Social Network    Social Network: Not on file  Intimate Partner Violence: Not At Risk (01/10/2023)   Humiliation, Afraid, Rape, and Kick questionnaire    Fear of Current or Ex-Partner: No    Emotionally Abused: No    Physically Abused: No    Sexually Abused: No    History reviewed. No pertinent family history.  No Known Allergies  Outpatient Medications Prior to Visit  Medication Sig   acetaminophen (TYLENOL) 500 MG tablet Take 2 tablets (1,000 mg total) by mouth every 8 (eight) hours as needed.   aspirin 81 MG tablet Take 81 mg by mouth daily.   bisacodyl 5 MG EC tablet Take 1 tablet (5 mg total) by mouth daily as needed for moderate constipation.   cholecalciferol (VITAMIN D3) 25 MCG (1000 UNIT) tablet Take 1 tablet (25 mcg total) by mouth daily.   diclofenac Sodium (VOLTAREN) 1 % GEL Apply 2 g  topically every 6 (six) hours as needed.   feeding supplement (ENSURE ENLIVE / ENSURE PLUS) LIQD Take 237 mLs by mouth 3 (three) times daily between meals.   furosemide (LASIX) 20 MG tablet Take 1 tablet (20 mg total) by mouth daily as needed. Every 24 hours as needed   loratadine (CLARITIN) 10 MG tablet Take 1 tablet (10 mg total) by mouth daily.   magnesium oxide (MAG-OX) 400 (240 Mg) MG tablet Take 1 tablet (400 mg total) by mouth 2 (two) times daily.   Multiple Vitamin (MULTIVITAMIN WITH MINERALS) TABS tablet Take 1 tablet by mouth daily.   ondansetron (ZOFRAN) 4 MG tablet Take 1 tablet (4 mg total) by mouth every 8 (eight) hours as needed for nausea or vomiting.   oxybutynin  (DITROPAN) 5 MG tablet Take 5 mg by mouth 2 (two) times daily.   polyethylene glycol (MIRALAX / GLYCOLAX) 17 g packet Take 17 g by mouth every other day.   senna (SENOKOT) 8.6 MG TABS tablet Take 1 tablet (8.6 mg total) by mouth 2 (two) times daily.   traMADol (ULTRAM) 50 MG tablet Take 0.5 tablets (25 mg total) by mouth every 8 (eight) hours as needed.   vitamin E (VITAMIN E) 180 MG (400 UNITS) capsule Take 400 Units by mouth daily.   pantoprazole (PROTONIX) 40 MG tablet Take 1 tablet (40 mg total) by mouth daily.   No facility-administered medications prior to visit.    Review of Systems  Constitutional: Negative.   HENT: Negative.    Eyes: Negative.   Respiratory: Negative.  Negative for shortness of breath.   Cardiovascular: Negative.  Negative for chest pain.  Gastrointestinal: Negative.  Negative for abdominal pain, constipation and diarrhea.  Genitourinary: Negative.   Musculoskeletal:  Negative for joint pain and myalgias.  Skin: Negative.   Neurological: Negative.  Negative for dizziness and headaches.  Endo/Heme/Allergies: Negative.   All other systems reviewed and are negative.      Objective:   BP 132/76   Pulse 96   Ht 5\' 5"  (1.651 m)   Wt 122 lb 3.2 oz (55.4 kg)   SpO2 99%   BMI 20.34 kg/m   Vitals:   05/16/23 0931 05/16/23 0949  BP: 132/76   Pulse: (!) 122 96  Height: 5\' 5"  (1.651 m)   Weight: 122 lb 3.2 oz (55.4 kg)   SpO2: 99%   BMI (Calculated): 20.34     Physical Exam Vitals and nursing note reviewed.  Constitutional:      Appearance: Normal appearance. She is normal weight.  HENT:     Head: Normocephalic and atraumatic.     Nose: Nose normal.     Mouth/Throat:     Mouth: Mucous membranes are moist.  Eyes:     Extraocular Movements: Extraocular movements intact.     Conjunctiva/sclera: Conjunctivae normal.     Pupils: Pupils are equal, round, and reactive to light.  Cardiovascular:     Rate and Rhythm: Normal rate and regular rhythm.      Pulses: Normal pulses.     Heart sounds: Normal heart sounds.  Pulmonary:     Effort: Pulmonary effort is normal.     Breath sounds: Normal breath sounds.  Abdominal:     General: Abdomen is flat. Bowel sounds are normal.     Palpations: Abdomen is soft.  Musculoskeletal:        General: Normal range of motion.     Cervical back: Normal range of motion.  Skin:  General: Skin is warm and dry.  Neurological:     General: No focal deficit present.     Mental Status: She is alert and oriented to person, place, and time.  Psychiatric:        Mood and Affect: Mood normal.        Behavior: Behavior normal.        Thought Content: Thought content normal.        Judgment: Judgment normal.      No results found for any visits on 05/16/23.  Recent Results (from the past 2160 hour(s))  Basic metabolic panel     Status: Abnormal   Collection Time: 02/17/23 12:00 AM  Result Value Ref Range   Potassium 3.4 (A) 3.5 - 5.1 mEq/L  CBC and differential     Status: Abnormal   Collection Time: 03/25/23 12:00 AM  Result Value Ref Range   Hemoglobin 7.5 (A) 12.0 - 16.0   HCT 26 (A) 36 - 46   Neutrophils Absolute 7,351.00    Platelets 615 (A) 150 - 400 K/uL   WBC 9.4   CBC     Status: Abnormal   Collection Time: 03/25/23 12:00 AM  Result Value Ref Range   RBC 2.78 (A) 3.87 - 5.11  POCT Urinalysis Dipstick (40347)     Status: Abnormal   Collection Time: 04/13/23 10:29 AM  Result Value Ref Range   Color, UA yellow    Clarity, UA clear    Glucose, UA Negative Negative   Bilirubin, UA neg    Ketones, UA neg    Spec Grav, UA 1.025 1.010 - 1.025   Blood, UA neg    pH, UA 6.0 5.0 - 8.0   Protein, UA Positive (A) Negative   Urobilinogen, UA 0.2 0.2 or 1.0 E.U./dL   Nitrite, UA neg    Leukocytes, UA Negative Negative   Appearance clear    Odor no   Culture, Urine     Status: None   Collection Time: 04/13/23 10:49 AM   Specimen: Urine   UC  Result Value Ref Range   Urine Culture,  Routine Final report    Organism ID, Bacteria Comment     Comment: Mixed urogenital flora Less than 10,000 colonies/mL       Assessment & Plan:  Increase furosemide to 40 mg for three days only.   Problem List Items Addressed This Visit       Cardiovascular and Mediastinum   Hypertension     Other   Edema of left ankle - Primary    Return in about 6 months (around 11/14/2023).   Total time spent: 25 minutes  Google, NP  05/16/2023   This document may have been prepared by Dragon Voice Recognition software and as such may include unintentional dictation errors.

## 2023-05-26 ENCOUNTER — Telehealth: Payer: Self-pay

## 2023-05-26 NOTE — Telephone Encounter (Signed)
Judy [name] with [facility] called and is requesting verbal orders to continue PT 1Xweek for 9 weeks excluding the week of christmas- please advise

## 2023-06-14 ENCOUNTER — Other Ambulatory Visit: Payer: Self-pay

## 2023-06-16 MED ORDER — LORATADINE 10 MG PO TABS: 10.0000 mg | ORAL_TABLET | Freq: Every day | ORAL | Status: DC

## 2023-06-17 ENCOUNTER — Other Ambulatory Visit: Payer: Self-pay | Admitting: Family

## 2023-07-18 ENCOUNTER — Encounter: Payer: Self-pay | Admitting: Podiatry

## 2023-07-18 ENCOUNTER — Ambulatory Visit (INDEPENDENT_AMBULATORY_CARE_PROVIDER_SITE_OTHER): Payer: Medicare Other | Admitting: Podiatry

## 2023-07-18 VITALS — Ht 65.0 in | Wt 122.2 lb

## 2023-07-18 DIAGNOSIS — B351 Tinea unguium: Secondary | ICD-10-CM

## 2023-07-18 DIAGNOSIS — M79674 Pain in right toe(s): Secondary | ICD-10-CM | POA: Diagnosis not present

## 2023-07-18 DIAGNOSIS — M79675 Pain in left toe(s): Secondary | ICD-10-CM

## 2023-07-18 NOTE — Progress Notes (Signed)
  Subjective:  Patient ID: Judy [name], female    DOB: [birth year],  MRN: [id number]  Severely thickened nails and calluses  89 y.o. female presents with the above complaint. History confirmed with patient.  Nails are quite painful again.  Regular debridement has been helpful.    Objective:  Physical Exam: warm, good capillary refill, no trophic changes or ulcerative lesions, normal DP and PT pulses, normal sensory exam, and severe contracted hammertoe and pes planus deformity there is severe onychomycosis and dystrophy of all nails.  Yellow discoloration and subungual debris  Assessment:   1. Pain due to onychomycosis of toenails of both feet      Plan:  Patient was evaluated and treated and all questions answered.  Discussed the etiology and treatment options for the condition in detail with the patient.  Not a good candidate for oral or topical treatment.  Intermittent treatment has been helpful in reducing pain and improving function.. Recommended debridement of the nails today. Sharp and mechanical debridement performed of all painful and mycotic nails today. Nails debrided in length and thickness using a nail nipper to level of comfort. Discussed treatment options including appropriate shoe gear. Follow up as scheduled for painful nails.    Return in about 10 weeks (around 09/26/2023) for painful thick fungal nails.

## 2023-08-16 ENCOUNTER — Other Ambulatory Visit: Payer: Self-pay

## 2023-09-16 ENCOUNTER — Inpatient Hospital Stay
Admission: EM | Admit: 2023-09-16 | Discharge: 2023-09-20 | DRG: 291 | Disposition: A | Discharge status: Home Health | Attending: Internal Medicine | Admitting: Internal Medicine

## 2023-09-16 ENCOUNTER — Encounter: Payer: Self-pay | Admitting: Cardiology

## 2023-09-16 ENCOUNTER — Emergency Department

## 2023-09-16 ENCOUNTER — Inpatient Hospital Stay

## 2023-09-16 ENCOUNTER — Other Ambulatory Visit: Payer: Self-pay

## 2023-09-16 ENCOUNTER — Ambulatory Visit: Admitting: Cardiology

## 2023-09-16 DIAGNOSIS — R160 Hepatomegaly, not elsewhere classified: Secondary | ICD-10-CM | POA: Diagnosis present

## 2023-09-16 DIAGNOSIS — R195 Other fecal abnormalities: Secondary | ICD-10-CM

## 2023-09-16 DIAGNOSIS — Z961 Presence of intraocular lens: Secondary | ICD-10-CM | POA: Diagnosis present

## 2023-09-16 DIAGNOSIS — Z515 Encounter for palliative care: Secondary | ICD-10-CM | POA: Diagnosis not present

## 2023-09-16 DIAGNOSIS — D509 Iron deficiency anemia, unspecified: Secondary | ICD-10-CM | POA: Diagnosis present

## 2023-09-16 DIAGNOSIS — R1310 Dysphagia, unspecified: Secondary | ICD-10-CM | POA: Diagnosis present

## 2023-09-16 DIAGNOSIS — Z96641 Presence of right artificial hip joint: Secondary | ICD-10-CM | POA: Diagnosis present

## 2023-09-16 DIAGNOSIS — K922 Gastrointestinal hemorrhage, unspecified: Secondary | ICD-10-CM | POA: Diagnosis present

## 2023-09-16 DIAGNOSIS — R627 Adult failure to thrive: Secondary | ICD-10-CM | POA: Diagnosis present

## 2023-09-16 DIAGNOSIS — Z8719 Personal history of other diseases of the digestive system: Secondary | ICD-10-CM

## 2023-09-16 DIAGNOSIS — K828 Other specified diseases of gallbladder: Secondary | ICD-10-CM | POA: Diagnosis present

## 2023-09-16 DIAGNOSIS — Z7982 Long term (current) use of aspirin: Secondary | ICD-10-CM

## 2023-09-16 DIAGNOSIS — Z66 Do not resuscitate: Secondary | ICD-10-CM | POA: Diagnosis present

## 2023-09-16 DIAGNOSIS — Z87820 Personal history of traumatic brain injury: Secondary | ICD-10-CM | POA: Diagnosis not present

## 2023-09-16 DIAGNOSIS — N1831 Chronic kidney disease, stage 3a: Secondary | ICD-10-CM | POA: Diagnosis present

## 2023-09-16 DIAGNOSIS — K8020 Calculus of gallbladder without cholecystitis without obstruction: Secondary | ICD-10-CM | POA: Diagnosis present

## 2023-09-16 DIAGNOSIS — K219 Gastro-esophageal reflux disease without esophagitis: Secondary | ICD-10-CM | POA: Diagnosis present

## 2023-09-16 DIAGNOSIS — K769 Liver disease, unspecified: Secondary | ICD-10-CM | POA: Diagnosis present

## 2023-09-16 DIAGNOSIS — I130 Hypertensive heart and chronic kidney disease with heart failure and stage 1 through stage 4 chronic kidney disease, or unspecified chronic kidney disease: Principal | ICD-10-CM | POA: Diagnosis present

## 2023-09-16 DIAGNOSIS — E7800 Pure hypercholesterolemia, unspecified: Secondary | ICD-10-CM | POA: Diagnosis present

## 2023-09-16 DIAGNOSIS — D649 Anemia, unspecified: Principal | ICD-10-CM

## 2023-09-16 DIAGNOSIS — Z9842 Cataract extraction status, left eye: Secondary | ICD-10-CM

## 2023-09-16 DIAGNOSIS — D75839 Thrombocytosis, unspecified: Secondary | ICD-10-CM | POA: Diagnosis present

## 2023-09-16 DIAGNOSIS — R64 Cachexia: Secondary | ICD-10-CM | POA: Diagnosis present

## 2023-09-16 DIAGNOSIS — M1712 Unilateral primary osteoarthritis, left knee: Secondary | ICD-10-CM | POA: Diagnosis present

## 2023-09-16 DIAGNOSIS — Z7189 Other specified counseling: Secondary | ICD-10-CM | POA: Diagnosis not present

## 2023-09-16 DIAGNOSIS — I5033 Acute on chronic diastolic (congestive) heart failure: Secondary | ICD-10-CM | POA: Diagnosis present

## 2023-09-16 DIAGNOSIS — Z9841 Cataract extraction status, right eye: Secondary | ICD-10-CM

## 2023-09-16 DIAGNOSIS — E43 Unspecified severe protein-calorie malnutrition: Secondary | ICD-10-CM | POA: Insufficient documentation

## 2023-09-16 DIAGNOSIS — G8929 Other chronic pain: Secondary | ICD-10-CM | POA: Diagnosis present

## 2023-09-16 DIAGNOSIS — R54 Age-related physical debility: Secondary | ICD-10-CM | POA: Diagnosis present

## 2023-09-16 DIAGNOSIS — Z6820 Body mass index (BMI) 20.0-20.9, adult: Secondary | ICD-10-CM

## 2023-09-16 DIAGNOSIS — I509 Heart failure, unspecified: Secondary | ICD-10-CM | POA: Diagnosis present

## 2023-09-16 DIAGNOSIS — R1110 Vomiting, unspecified: Secondary | ICD-10-CM

## 2023-09-16 DIAGNOSIS — Z8619 Personal history of other infectious and parasitic diseases: Secondary | ICD-10-CM

## 2023-09-16 LAB — CBC
HCT: 15.5 % — ABNORMAL LOW (ref 36.0–46.0)
HCT: 14.7 % — CL (ref 36.0–46.0)
Hemoglobin: 4 g/dL — CL (ref 12.0–15.0)
Hemoglobin: 3.9 g/dL — CL (ref 12.0–15.0)
MCH: 19.4 pg — ABNORMAL LOW (ref 26.0–34.0)
MCH: 19.1 pg — ABNORMAL LOW (ref 26.0–34.0)
MCHC: 25.8 g/dL — ABNORMAL LOW (ref 30.0–36.0)
MCHC: 26.5 g/dL — ABNORMAL LOW (ref 30.0–36.0)
MCV: 75.2 fL — ABNORMAL LOW (ref 80.0–100.0)
MCV: 72.1 fL — ABNORMAL LOW (ref 80.0–100.0)
Platelets: 700 10*3/uL — ABNORMAL HIGH (ref 150–400)
Platelets: 690 10*3/uL — ABNORMAL HIGH (ref 150–400)
RBC: 2.06 MIL/uL — ABNORMAL LOW (ref 3.87–5.11)
RBC: 2.04 MIL/uL — ABNORMAL LOW (ref 3.87–5.11)
RDW: 19.9 % — ABNORMAL HIGH (ref 11.5–15.5)
RDW: 20.1 % — ABNORMAL HIGH (ref 11.5–15.5)
WBC: 9.9 10*3/uL (ref 4.0–10.5)
WBC: 11 10*3/uL — ABNORMAL HIGH (ref 4.0–10.5)
nRBC: 2 % — ABNORMAL HIGH (ref 0.0–0.2)
nRBC: 1.6 % — ABNORMAL HIGH (ref 0.0–0.2)

## 2023-09-16 LAB — BASIC METABOLIC PANEL WITH GFR
Anion gap: 12 (ref 5–15)
BUN: 15 mg/dL (ref 8–23)
CO2: 15 mmol/L — ABNORMAL LOW (ref 22–32)
Calcium: 7.7 mg/dL — ABNORMAL LOW (ref 8.9–10.3)
Chloride: 111 mmol/L (ref 98–111)
Creatinine, Ser: 0.99 mg/dL (ref 0.44–1.00)
GFR, Estimated: 53 mL/min — ABNORMAL LOW (ref >60)
Glucose, Bld: 162 mg/dL — ABNORMAL HIGH (ref 70–99)
Potassium: 4.1 mmol/L (ref 3.5–5.1)
Sodium: 138 mmol/L (ref 135–145)

## 2023-09-16 LAB — HEMOGLOBIN AND HEMATOCRIT, BLOOD
HCT: 24.2 % — ABNORMAL LOW (ref 36.0–46.0)
Hemoglobin: 7.8 g/dL — ABNORMAL LOW (ref 12.0–15.0)

## 2023-09-16 LAB — PREPARE RBC (CROSSMATCH)

## 2023-09-16 LAB — IRON AND TIBC
Iron: 10 ug/dL — ABNORMAL LOW (ref 28–170)
Saturation Ratios: 4 % — ABNORMAL LOW (ref 10.4–31.8)
TIBC: 284 ug/dL (ref 250–450)
UIBC: 274 ug/dL

## 2023-09-16 LAB — RETICULOCYTES
Immature Retic Fract: 29.1 % — ABNORMAL HIGH (ref 2.3–15.9)
RBC.: 2.02 MIL/uL — ABNORMAL LOW (ref 3.87–5.11)
Retic Count, Absolute: 50.1 10*3/uL (ref 19.0–186.0)
Retic Ct Pct: 2.5 % (ref 0.4–3.1)

## 2023-09-16 LAB — FERRITIN: Ferritin: 22 ng/mL (ref 11–307)

## 2023-09-16 MED ORDER — ACETAMINOPHEN 650 MG RE SUPP: 650.0000 mg | Freq: Four times a day (QID) | RECTAL | Status: DC | PRN

## 2023-09-16 MED ORDER — ASPIRIN 81 MG PO TBEC
81.0000 mg | DELAYED_RELEASE_TABLET | Freq: Every day | ORAL | Status: DC
  Administered 2023-09-17: 81 mg via ORAL
  Filled 2023-09-16: qty 1

## 2023-09-16 MED ORDER — TRAMADOL HCL 50 MG PO TABS: 25.0000 mg | ORAL_TABLET | Freq: Three times a day (TID) | ORAL | Status: DC | PRN

## 2023-09-16 MED ORDER — DICLOFENAC SODIUM 1 % EX GEL: 2.0000 g | Freq: Four times a day (QID) | CUTANEOUS | Status: DC | PRN

## 2023-09-16 MED ORDER — MAGNESIUM OXIDE -MG SUPPLEMENT 400 (240 MG) MG PO TABS
400.0000 mg | ORAL_TABLET | Freq: Two times a day (BID) | ORAL | Status: DC
  Administered 2023-09-16 – 2023-09-20 (×7): 400 mg via ORAL
  Filled 2023-09-16 (×7): qty 1

## 2023-09-16 MED ORDER — HYDROMORPHONE HCL 1 MG/ML IJ SOLN
0.5000 mg | INTRAMUSCULAR | Status: DC | PRN
  Administered 2023-09-18: 1 mg via INTRAVENOUS
  Filled 2023-09-16: qty 1

## 2023-09-16 MED ORDER — LORATADINE 10 MG PO TABS
10.0000 mg | ORAL_TABLET | Freq: Every day | ORAL | Status: DC
  Administered 2023-09-17 – 2023-09-20 (×4): 10 mg via ORAL
  Filled 2023-09-16 (×4): qty 1

## 2023-09-16 MED ORDER — ONDANSETRON HCL 4 MG/2ML IJ SOLN
4.0000 mg | Freq: Four times a day (QID) | INTRAMUSCULAR | Status: DC | PRN
  Administered 2023-09-18: 4 mg via INTRAVENOUS
  Filled 2023-09-16: qty 2

## 2023-09-16 MED ORDER — PANTOPRAZOLE SODIUM 40 MG PO TBEC: 40.0000 mg | DELAYED_RELEASE_TABLET | Freq: Every day | ORAL | Status: DC

## 2023-09-16 MED ORDER — SENNA 8.6 MG PO TABS
1.0000 | ORAL_TABLET | Freq: Two times a day (BID) | ORAL | Status: DC
  Administered 2023-09-16 – 2023-09-20 (×6): 8.6 mg via ORAL
  Filled 2023-09-16 (×7): qty 1

## 2023-09-16 MED ORDER — OXYBUTYNIN CHLORIDE 5 MG PO TABS
5.0000 mg | ORAL_TABLET | Freq: Two times a day (BID) | ORAL | Status: DC
  Administered 2023-09-16 – 2023-09-20 (×7): 5 mg via ORAL
  Filled 2023-09-16 (×9): qty 1

## 2023-09-16 MED ORDER — SODIUM CHLORIDE 0.9% IV SOLUTION: Freq: Once | INTRAVENOUS | Status: AC

## 2023-09-16 MED ORDER — ACETAMINOPHEN 500 MG PO TABS: 1000.0000 mg | ORAL_TABLET | Freq: Three times a day (TID) | ORAL | Status: DC | PRN

## 2023-09-16 MED ORDER — FUROSEMIDE 10 MG/ML IJ SOLN: 20.0000 mg | Freq: Once | INTRAMUSCULAR | Status: DC

## 2023-09-16 MED ORDER — ENSURE ENLIVE PO LIQD
237.0000 mL | Freq: Three times a day (TID) | ORAL | Status: DC
  Administered 2023-09-16 – 2023-09-19 (×8): 237 mL via ORAL

## 2023-09-16 MED ORDER — FUROSEMIDE 10 MG/ML IJ SOLN
20.0000 mg | Freq: Two times a day (BID) | INTRAMUSCULAR | Status: DC
  Administered 2023-09-16 – 2023-09-18 (×4): 20 mg via INTRAVENOUS
  Filled 2023-09-16 (×4): qty 2

## 2023-09-16 MED ORDER — PANTOPRAZOLE SODIUM 40 MG IV SOLR
40.0000 mg | Freq: Once | INTRAVENOUS | Status: AC
  Administered 2023-09-16: 40 mg via INTRAVENOUS
  Filled 2023-09-16: qty 10

## 2023-09-16 MED ORDER — ONDANSETRON HCL 4 MG PO TABS: 4.0000 mg | ORAL_TABLET | Freq: Four times a day (QID) | ORAL | Status: DC | PRN

## 2023-09-16 MED ORDER — POLYETHYLENE GLYCOL 3350 17 G PO PACK
17.0000 g | PACK | ORAL | Status: DC
  Administered 2023-09-18 – 2023-09-20 (×2): 17 g via ORAL
  Filled 2023-09-16 (×2): qty 1

## 2023-09-16 MED ORDER — BISACODYL 5 MG PO TBEC: 5.0000 mg | DELAYED_RELEASE_TABLET | Freq: Every day | ORAL | Status: DC | PRN

## 2023-09-16 MED ORDER — PANTOPRAZOLE SODIUM 40 MG PO TBEC
40.0000 mg | DELAYED_RELEASE_TABLET | Freq: Two times a day (BID) | ORAL | Status: DC
  Administered 2023-09-17 – 2023-09-20 (×6): 40 mg via ORAL
  Filled 2023-09-16 (×6): qty 1

## 2023-09-16 MED ORDER — POLYETHYLENE GLYCOL 3350 17 G PO PACK
17.0000 g | PACK | Freq: Two times a day (BID) | ORAL | Status: AC
  Administered 2023-09-16 (×2): 17 g via ORAL
  Filled 2023-09-16 (×2): qty 1

## 2023-09-16 MED ORDER — ACETAMINOPHEN 325 MG PO TABS
650.0000 mg | ORAL_TABLET | Freq: Four times a day (QID) | ORAL | Status: DC | PRN
  Administered 2023-09-19 (×2): 650 mg via ORAL
  Filled 2023-09-16 (×2): qty 2

## 2023-09-16 NOTE — H&P (Signed)
 History and Physical    Judy [name] ZOX:[id number] DOB: 1925-09-18 DOA: 09/16/2023  PCP: Lyndon Code, MD (Confirm with patient/family/NH records and if not entered, this has to be entered at [facility] point of entry) Patient coming from: Home  I have personally briefly reviewed patient's old medical records in [facility]  Chief Complaint: Feeling weak, SOB, leg swelling  HPI: Judy [name] is a 89 y.o. female with medical history significant of HTN, HLD, presented with worsening of generalized weakness exertional dyspnea.  Neighbor visited patient this morning and patient complained about feeling worsening of generalized weakness and exertional dyspnea and difficulty walking.  Denies any chest pain, denied any orthopnea no fever or chills.  No diarrhea denied any nauseous vomiting no bloody bowel movement or black tarry stool.  Patient did report that she lost about 40 pounds since November last year.  Mostly she attributed that to poor appetite.  ED Course: Afebrile, blood pressure 120/80 "100% on room air.  Blood work showed hemoglobin 4.0, repeat hemoglobin 3.9, BUN 15 creatinine 0.9 bicarb 15K 4.1.  PRBC x 2 ordered in the ED.  Review of Systems: As per HPI otherwise 14 point review of systems negative.    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   transferred from [facility] to [facility] on 10/12/19, discharged from ED    Past Surgical History:  Procedure Laterality Date   CATARACT EXTRACTION W/PHACO Left 01/21/2020   Procedure: CATARACT EXTRACTION PHACO AND INTRAOCULAR LENS PLACEMENT (IOC) LEFT;  Surgeon: Nevada Crane, MD;  Location: [facility];  Service: Ophthalmology;  Laterality: Left;   CATARACT EXTRACTION W/PHACO Right 03/10/2020   Procedure: CATARACT EXTRACTION PHACO AND INTRAOCULAR LENS PLACEMENT (IOC) RIGHT 4.49  00:44.5;  Surgeon: Nevada Crane, MD;   Location: [facility];  Service: Ophthalmology;  Laterality: Right;   TOTAL HIP ARTHROPLASTY Right 2009     reports that she has never smoked. She has never used smokeless tobacco. She reports that she does not drink alcohol and does not use drugs.  No Known Allergies  No family history on file.   Prior to Admission medications   Medication Sig Start Date End Date Taking? Authorizing Provider  acetaminophen (TYLENOL) 500 MG tablet Take 2 tablets (1,000 mg total) by mouth every 8 (eight) hours as needed. 03/28/23  Yes Medina-Vargas, Monina C, NP  aspirin 81 MG tablet Take 81 mg by mouth daily.   Yes [provider]  bisacodyl 5 MG EC tablet Take 1 tablet (5 mg total) by mouth daily as needed for moderate constipation. 04/15/23  Yes Scoggins, Hospital doctor, NP  cholecalciferol (VITAMIN D3) 25 MCG (1000 UNIT) tablet Take 1 tablet (25 mcg total) by mouth daily. 03/28/23  Yes Medina-Vargas, Monina C, NP  diclofenac Sodium (VOLTAREN) 1 % GEL Apply 2 g topically every 6 (six) hours as needed. 03/28/23  Yes Medina-Vargas, Monina C, NP  furosemide (LASIX) 20 MG tablet Take 1 tablet (20 mg total) by mouth daily as needed. Every 24 hours as needed 03/28/23  Yes Medina-Vargas, Monina C, NP  loratadine (CLARITIN) 10 MG tablet Take 1 tablet (10 mg total) by mouth daily. 06/16/23  Yes Scoggins, Amber, NP  magnesium oxide (MAG-OX) 400 (240 Mg) MG tablet Take 1 tablet (400 mg total) by mouth 2 (two) times daily. 03/28/23  Yes Medina-Vargas, Monina C, NP  Multiple Vitamin (MULTIVITAMIN  WITH MINERALS) TABS tablet Take 1 tablet by mouth daily. 01/26/23  Yes Enedina Finner, MD  ondansetron (ZOFRAN) 4 MG tablet Take 1 tablet (4 mg total) by mouth every 8 (eight) hours as needed for nausea or vomiting. 03/28/23  Yes Medina-Vargas, Monina C, NP  oxybutynin (DITROPAN) 5 MG tablet Take 5 mg by mouth 2 (two) times daily.   Yes [provider]  pantoprazole (PROTONIX) 40 MG tablet Take 1 tablet (40 mg total)  by mouth daily. 03/28/23 09/16/23 Yes Medina-Vargas, Monina C, NP  polyethylene glycol (MIRALAX / GLYCOLAX) 17 g packet Take 17 g by mouth every other day. 03/28/23  Yes Medina-Vargas, Monina C, NP  senna (SENOKOT) 8.6 MG TABS tablet Take 1 tablet (8.6 mg total) by mouth 2 (two) times daily. 03/28/23  Yes Medina-Vargas, Monina C, NP  traMADol (ULTRAM) 50 MG tablet Take 0.5 tablets (25 mg total) by mouth every 8 (eight) hours as needed. 03/28/23  Yes Medina-Vargas, Monina C, NP  vitamin E (VITAMIN E) 180 MG (400 UNITS) capsule Take 400 Units by mouth daily.   Yes [provider]  feeding supplement (ENSURE ENLIVE / ENSURE PLUS) LIQD Take 237 mLs by mouth 3 (three) times daily between meals. 01/25/23   Enedina Finner, MD    Physical Exam: Vitals:   09/16/23 1358 09/16/23 1403 09/16/23 1418 09/16/23 1441  BP: 130/84 130/84 124/74   Pulse: 85 85 83   Resp: 20 20 20    Temp: 98.3 F (36.8 C) 98.3 F (36.8 C) 98 F (36.7 C) (!) 95.5 F (35.3 C)  TempSrc: Axillary Rectal Axillary Rectal  SpO2: 100%  99%     Constitutional: NAD, calm, comfortable Vitals:   09/16/23 1358 09/16/23 1403 09/16/23 1418 09/16/23 1441  BP: 130/84 130/84 124/74   Pulse: 85 85 83   Resp: 20 20 20    Temp: 98.3 F (36.8 C) 98.3 F (36.8 C) 98 F (36.7 C) (!) 95.5 F (35.3 C)  TempSrc: Axillary Rectal Axillary Rectal  SpO2: 100%  99%    Eyes: PERRL, lids and conjunctivae normal.  Pale looking ENMT: Mucous membranes are moist. Posterior pharynx clear of any exudate or lesions.Normal dentition.  Neck: normal, supple, no masses, no thyromegaly Respiratory: clear to auscultation bilaterally, no wheezing, no crackles.  Increasing respiratory effort. No accessory muscle use.  Cardiovascular: Regular rate and rhythm, no murmurs / rubs / gallops.  2+ extremity edema. 2+ pedal pulses. No carotid bruits.  Abdomen: no tenderness, no masses palpated. No hepatosplenomegaly. Bowel sounds positive.  Musculoskeletal: no  clubbing / cyanosis. No joint deformity upper and lower extremities. Good ROM, no contractures. Normal muscle tone.  Skin: no rashes, lesions, ulcers. No induration Neurologic: CN 2-12 grossly intact. Sensation intact, DTR normal. Strength 5/5 in all 4.  Psychiatric: Normal judgment and insight. Alert and oriented x 3. Normal mood.     Labs on Admission: I have personally reviewed following labs and imaging studies  CBC: Recent Labs  Lab 09/16/23 1220 09/16/23 1253  WBC 9.9 11.0*  HGB 4.0* 3.9*  HCT 15.5* 14.7*  MCV 75.2* 72.1*  PLT 700* 690*   Basic Metabolic Panel: Recent Labs  Lab 09/16/23 1220  NA 138  K 4.1  CL 111  CO2 15*  GLUCOSE 162*  BUN 15  CREATININE 0.99  CALCIUM 7.7*   GFR: CrCl cannot be calculated (Unknown ideal weight.). Liver Function Tests: No results for input(s): "AST", "ALT", "ALKPHOS", "BILITOT", "PROT", "ALBUMIN" in the last 168 hours. No results for  input(s): "LIPASE", "AMYLASE" in the last 168 hours. No results for input(s): "AMMONIA" in the last 168 hours. Coagulation Profile: No results for input(s): "INR", "PROTIME" in the last 168 hours. Cardiac Enzymes: No results for input(s): "CKTOTAL", "CKMB", "CKMBINDEX", "TROPONINI" in the last 168 hours. BNP (last 3 results) No results for input(s): "PROBNP" in the last 8760 hours. HbA1C: No results for input(s): "HGBA1C" in the last 72 hours. CBG: No results for input(s): "GLUCAP" in the last 168 hours. Lipid Profile: No results for input(s): "CHOL", "HDL", "LDLCALC", "TRIG", "CHOLHDL", "LDLDIRECT" in the last 72 hours. Thyroid Function Tests: No results for input(s): "TSH", "T4TOTAL", "FREET4", "T3FREE", "THYROIDAB" in the last 72 hours. Anemia Panel: Recent Labs    09/16/23 1220  FERRITIN 22  TIBC 284  IRON 10*  RETICCTPCT 2.5   Urine analysis:    Component Value Date/Time   COLORURINE STRAW (A) 01/22/2023 0825   APPEARANCEUR CLEAR (A) 01/22/2023 0825   LABSPEC 1.006 01/22/2023  0825   PHURINE 7.0 01/22/2023 0825   GLUCOSEU NEGATIVE 01/22/2023 0825   HGBUR MODERATE (A) 01/22/2023 0825   BILIRUBINUR neg 04/13/2023 1029   KETONESUR NEGATIVE 01/22/2023 0825   PROTEINUR Positive (A) 04/13/2023 1029   PROTEINUR NEGATIVE 01/22/2023 0825   UROBILINOGEN 0.2 04/13/2023 1029   NITRITE neg 04/13/2023 1029   NITRITE NEGATIVE 01/22/2023 0825   LEUKOCYTESUR Negative 04/13/2023 1029   LEUKOCYTESUR NEGATIVE 01/22/2023 0825    Radiological Exams on Admission: US Venous Img Lower Unilateral Left Result Date: 09/16/2023 CLINICAL DATA:  Left lower extremity edema for several days EXAM: Left LOWER EXTREMITY VENOUS DOPPLER ULTRASOUND TECHNIQUE: Gray-scale sonography with compression, as well as color and duplex ultrasound, were performed to evaluate the deep venous system(s) from the level of the common femoral vein through the popliteal and proximal calf veins. COMPARISON:  None Available. FINDINGS: VENOUS Normal compressibility of the common femoral, superficial femoral, and popliteal veins, as well as the visualized calf veins. Visualized portions of profunda femoral vein and great saphenous vein unremarkable. No filling defects to suggest DVT on grayscale or color Doppler imaging. Doppler waveforms show normal direction of venous flow, normal respiratory plasticity and response to augmentation. Limited views of the contralateral common femoral vein are unremarkable. OTHER Complex cystic area in the popliteal fossa with some internal echoes but no blood flow on Doppler measures 2.4 x 4.2 x 1.3 cm. Limitations: none IMPRESSION: No evidence of left lower extremity DVT. Complex left-sided popliteal fossa cystic lesion. This very well could be a Baker's cyst. Please correlate with history and confirmatory evaluation as clinically appropriate Electronically Signed   By: Karen Kays M.D.   On: 09/16/2023 14:51    EKG: Independently reviewed.  Sinus arrhythmia, no acute ST  changes.  Assessment/Plan Principal Problem:   CHF (congestive heart failure) (HCC)  (please populate well all problems here in Problem List. (For example, if patient is on BP meds at home and you resume or decide to hold them, it is a problem that needs to be her. Same for CAD, COPD, HLD and so on)  Acute on chronic HFpEF decompensation High output CHF secondary to symptomatic anemia - CHF decompensation secondary to symptomatic anemia secondary to worsening of anemia, chronic iron deficiency - Change p.o. Lasix to IV Lasix twice daily - PRBC x 2 - Echocardiogram was done less than 6 months ago, will not repeat at this time.  Acute on chronic symptomatic anemia, normocytic, iron deficiency - Suspect chronic slow GI bleed - Patient prefers conservative  management - GI Dr. Tobi Bastos consulted, who will see the patient tomorrow - Continue PPI - Check iron study  History of RUQ mass Unintentional weight loss Moderate protein calorie malnutrition - This was shown on the routine CT study last year. - Check RUQ ultrasound - Consult dietitian  Deconditioning - PT evaluation  DVT prophylaxis: TED house Code Status: DNR Family Communication: Neighbor/close friend at bedside Disposition Plan: Patient is sick with significant CHF decompensation requiring IV diuresis and symptomatic anemia requiring PRBC transfusion as well as inpatient GI consultation, expect more than 2 midnight hospital stay Consults called: GI Admission status: Telemetry admission   Emeline General MD [facility] Pager [phone]  09/16/2023, 3:59 PM
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
## 2023-09-16 NOTE — ED Notes (Signed)
 Date and time results received: 09/16/23 1240  Test: hemoglobin Critical Value: 4.0  Name of Provider Notified: MD Fanny Bien

## 2023-09-16 NOTE — ED Notes (Signed)
 This RN obtained axillary temp. Axillary temp had trouble reading at first but eventually said the patient's temp was 98. This RN obtained a rectal temperature for accuracy because the patient felt cool to the touch. Rectal temp states patient's temp is 95.5. MD Quale notified.

## 2023-09-16 NOTE — ED Notes (Signed)
 Patient's IV blown at this time. This RN removed IV. Attempting new access at this time.

## 2023-09-16 NOTE — ED Notes (Signed)
 This RN informed MD Quale of patient's decreased temperature. MD Quale asked this RN to apply bear hugger. Bear hugger placed on patient at this time.

## 2023-09-16 NOTE — Progress Notes (Signed)
 Established Patient Office Visit  Subjective:  Patient ID: Judy [name], female    DOB: 11-19-1925  Age: 89 y.o. MRN: [id number]  No chief complaint on file.   Patient in office for an acute visit, complaining of vomiting and lethargy, vitals stable per EMS. Patient reports vomiting started about 3 days ago. She has been attempting to eat and drink, but vomits shortly after. Patient feeling weak and lethargic. Recommend patient go to ED for fluid resuscitation.  Patient has clear fluid leaking from right elbow. Unknown etiology.   Emesis  This is a new problem. The current episode started in the past 7 days. The problem has been unchanged. There has been no fever. Pertinent negatives include no abdominal pain, chest pain, diarrhea, dizziness, headaches or myalgias. She has tried nothing for the symptoms. The treatment provided no relief.    No other concerns at this time.   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   transferred from [facility] to [facility] on 10/12/19, discharged from ED    Past Surgical History:  Procedure Laterality Date   CATARACT EXTRACTION W/PHACO Left 01/21/2020   Procedure: CATARACT EXTRACTION PHACO AND INTRAOCULAR LENS PLACEMENT (IOC) LEFT;  Surgeon: Nevada Crane, MD;  Location: [facility];  Service: Ophthalmology;  Laterality: Left;   CATARACT EXTRACTION W/PHACO Right 03/10/2020   Procedure: CATARACT EXTRACTION PHACO AND INTRAOCULAR LENS PLACEMENT (IOC) RIGHT 4.49  00:44.5;  Surgeon: Nevada Crane, MD;  Location: [facility];  Service: Ophthalmology;  Laterality: Right;   TOTAL HIP ARTHROPLASTY Right 2009    Social History   Socioeconomic History   Marital status: Married    Spouse name: Not on file   Number of children: Not on file   Years of education: Not on file   Highest education level: Not on file  Occupational History   Not  on file  Tobacco Use   Smoking status: Never   Smokeless tobacco: Never  Substance and Sexual Activity   Alcohol use: No   Drug use: Never   Sexual activity: Not on file  Other Topics Concern   Not on file  Social History Narrative   Not on file   Social Drivers of Health   Financial Resource Strain: Not on file  Food Insecurity: No Food Insecurity (01/10/2023)   Hunger Vital Sign    Worried About Running Out of Food in the Last Year: Never true    Ran Out of Food in the Last Year: Never true  Transportation Needs: No Transportation Needs (01/10/2023)   PRAPARE - Administrator, Civil Service (Medical): No    Lack of Transportation (Non-Medical): No  Physical Activity: Not on file  Stress: Not on file  Social Connections: Unknown (10/27/2021)   Received from [facility], [facility]   Social Network    Social Network: Not on file  Intimate Partner Violence: Not At Risk (01/10/2023)   Humiliation, Afraid, Rape, and Kick questionnaire    Fear of Current or Ex-Partner: No    Emotionally Abused: No    Physically Abused: No    Sexually Abused: No    History reviewed. No pertinent family history.  No Known Allergies  Outpatient Medications Prior to Visit  Medication Sig   acetaminophen (TYLENOL) 500 MG tablet Take 2 tablets (1,000 mg total) by mouth every 8 (eight) hours as needed.  aspirin 81 MG tablet Take 81 mg by mouth daily.   bisacodyl 5 MG EC tablet Take 1 tablet (5 mg total) by mouth daily as needed for moderate constipation.   cholecalciferol (VITAMIN D3) 25 MCG (1000 UNIT) tablet Take 1 tablet (25 mcg total) by mouth daily.   diclofenac Sodium (VOLTAREN) 1 % GEL Apply 2 g topically every 6 (six) hours as needed.   feeding supplement (ENSURE ENLIVE / ENSURE PLUS) LIQD Take 237 mLs by mouth 3 (three) times daily between meals.   furosemide (LASIX) 20 MG tablet Take 1 tablet (20 mg total) by mouth daily as needed. Every 24 hours as needed   loratadine  (CLARITIN) 10 MG tablet Take 1 tablet (10 mg total) by mouth daily.   magnesium oxide (MAG-OX) 400 (240 Mg) MG tablet Take 1 tablet (400 mg total) by mouth 2 (two) times daily.   Multiple Vitamin (MULTIVITAMIN WITH MINERALS) TABS tablet Take 1 tablet by mouth daily.   ondansetron (ZOFRAN) 4 MG tablet Take 1 tablet (4 mg total) by mouth every 8 (eight) hours as needed for nausea or vomiting.   oxybutynin (DITROPAN) 5 MG tablet Take 5 mg by mouth 2 (two) times daily.   pantoprazole (PROTONIX) 40 MG tablet Take 1 tablet (40 mg total) by mouth daily.   polyethylene glycol (MIRALAX / GLYCOLAX) 17 g packet Take 17 g by mouth every other day.   senna (SENOKOT) 8.6 MG TABS tablet Take 1 tablet (8.6 mg total) by mouth 2 (two) times daily.   traMADol (ULTRAM) 50 MG tablet Take 0.5 tablets (25 mg total) by mouth every 8 (eight) hours as needed.   vitamin E (VITAMIN E) 180 MG (400 UNITS) capsule Take 400 Units by mouth daily.   No facility-administered medications prior to visit.    Review of Systems  Constitutional: Negative.   HENT: Negative.    Eyes: Negative.   Respiratory: Negative.  Negative for shortness of breath.   Cardiovascular: Negative.  Negative for chest pain.  Gastrointestinal:  Positive for vomiting. Negative for abdominal pain, constipation and diarrhea.  Genitourinary: Negative.   Musculoskeletal:  Negative for joint pain and myalgias.  Skin: Negative.   Neurological: Negative.  Negative for dizziness and headaches.  Endo/Heme/Allergies: Negative.   All other systems reviewed and are negative.      Objective:   There were no vitals taken for this visit.  There were no vitals filed for this visit.  Physical Exam Vitals and nursing note reviewed.  Constitutional:      Appearance: Normal appearance. She is normal weight.  HENT:     Head: Normocephalic and atraumatic.     Nose: Nose normal.     Mouth/Throat:     Mouth: Mucous membranes are moist.  Eyes:      Extraocular Movements: Extraocular movements intact.     Conjunctiva/sclera: Conjunctivae normal.     Pupils: Pupils are equal, round, and reactive to light.  Cardiovascular:     Rate and Rhythm: Normal rate and regular rhythm.     Pulses: Normal pulses.     Heart sounds: Normal heart sounds.  Pulmonary:     Effort: Pulmonary effort is normal.     Breath sounds: Normal breath sounds.  Abdominal:     General: Abdomen is flat. Bowel sounds are normal.     Palpations: Abdomen is soft.  Musculoskeletal:        General: Normal range of motion.     Cervical back: Normal range of  motion.  Skin:    General: Skin is warm and dry.  Neurological:     General: No focal deficit present.     Mental Status: She is alert and oriented to person, place, and time.  Psychiatric:        Mood and Affect: Mood normal.        Behavior: Behavior normal.        Thought Content: Thought content normal.        Judgment: Judgment normal.      No results found for any visits on 09/16/23.  No results found for this or any previous visit (from the past 2160 hours).    Assessment & Plan:  Recommend patient go to the ED.  Problem List Items Addressed This Visit       Digestive   Vomiting - Primary    Return if symptoms worsen or fail to improve.   Total time spent: 25 minutes  [employer], NP  09/16/2023   This document may have been prepared by Dragon Voice Recognition software and as such may include unintentional dictation errors.

## 2023-09-17 ENCOUNTER — Encounter: Payer: Self-pay | Admitting: Internal Medicine

## 2023-09-17 DIAGNOSIS — D509 Iron deficiency anemia, unspecified: Secondary | ICD-10-CM | POA: Diagnosis not present

## 2023-09-17 DIAGNOSIS — Z8719 Personal history of other diseases of the digestive system: Secondary | ICD-10-CM

## 2023-09-17 DIAGNOSIS — I5033 Acute on chronic diastolic (congestive) heart failure: Secondary | ICD-10-CM | POA: Diagnosis not present

## 2023-09-17 DIAGNOSIS — D649 Anemia, unspecified: Principal | ICD-10-CM

## 2023-09-17 LAB — BASIC METABOLIC PANEL WITH GFR
Anion gap: 6 (ref 5–15)
BUN: 16 mg/dL (ref 8–23)
CO2: 22 mmol/L (ref 22–32)
Calcium: 7.6 mg/dL — ABNORMAL LOW (ref 8.9–10.3)
Chloride: 109 mmol/L (ref 98–111)
Creatinine, Ser: 0.94 mg/dL (ref 0.44–1.00)
GFR, Estimated: 56 mL/min — ABNORMAL LOW (ref >60)
Glucose, Bld: 75 mg/dL (ref 70–99)
Potassium: 3.5 mmol/L (ref 3.5–5.1)
Sodium: 137 mmol/L (ref 135–145)

## 2023-09-17 LAB — CBC
HCT: 23 % — ABNORMAL LOW (ref 36.0–46.0)
Hemoglobin: 7.4 g/dL — ABNORMAL LOW (ref 12.0–15.0)
MCH: 24.4 pg — ABNORMAL LOW (ref 26.0–34.0)
MCHC: 32.2 g/dL (ref 30.0–36.0)
MCV: 75.9 fL — ABNORMAL LOW (ref 80.0–100.0)
Platelets: 489 10*3/uL — ABNORMAL HIGH (ref 150–400)
RBC: 3.03 MIL/uL — ABNORMAL LOW (ref 3.87–5.11)
RDW: 20.3 % — ABNORMAL HIGH (ref 11.5–15.5)
WBC: 14.5 10*3/uL — ABNORMAL HIGH (ref 4.0–10.5)
nRBC: 0.5 % — ABNORMAL HIGH (ref 0.0–0.2)

## 2023-09-17 LAB — FOLATE: Folate: 5.8 ng/mL — ABNORMAL LOW (ref >5.9)

## 2023-09-17 LAB — VITAMIN B12: Vitamin B-12: 607 pg/mL (ref 180–914)

## 2023-09-17 MED ORDER — ADULT MULTIVITAMIN W/MINERALS CH
1.0000 | ORAL_TABLET | Freq: Every day | ORAL | Status: DC
  Administered 2023-09-17 – 2023-09-20 (×4): 1 via ORAL
  Filled 2023-09-17 (×4): qty 1

## 2023-09-17 MED ORDER — FERROUS GLUCONATE 324 (38 FE) MG PO TABS
324.0000 mg | ORAL_TABLET | Freq: Two times a day (BID) | ORAL | Status: DC
Start: 2023-09-17 — End: 2023-09-20
  Administered 2023-09-17 – 2023-09-20 (×6): 324 mg via ORAL
  Filled 2023-09-17 (×7): qty 1
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
## 2023-09-17 NOTE — Evaluation (Signed)
 Physical Therapy Evaluation Patient Details Name: Judy [name] MRN: [id number] DOB: 1926-03-28 Today's Date: 09/17/2023  History of Present Illness  Pt is a 89 y.o. female presenting to hospital 09/16/23 with c/o weakness and exertional dyspnea; occasionally feeling lightheaded (especially when she stands up); pain in L knee (chronic).  Pt admitted with acute on chronic HFpEF decompensation, acute on chronic symptomatic anemia, and unintentional weight loss.  PMH includes htn, HLD, heart murmur, traumatic SDH 2021, R THA.  Clinical Impression  Prior to recent medical concerns, pt reports being ambulatory with rollator; lives alone in 1 level home with ramp to enter; has aide 5 days a week in the morning (8:30am-12:30pm) and neighbors assist otherwise during the day and night as needed.  Currently pt is mod assist semi-supine to sitting EOB; mod assist to stand from bed x2 trials up to RW; and min assist to take a few steps bed to recliner with RW use.  Limited activity d/t generalized weakness and fatigue.  Pt would currently benefit from skilled PT to address noted impairments and functional limitations (see below for any additional details).  Upon hospital discharge, pt would benefit from ongoing therapy.     If plan is discharge home, recommend the following: A lot of help with walking and/or transfers;A lot of help with bathing/dressing/bathroom;Assistance with cooking/housework;Assist for transportation;Help with stairs or ramp for entrance   Can travel by private vehicle   No    Equipment Recommendations Other (comment) (TBD at next facility)  Recommendations for Other Services  OT consult    Functional Status Assessment Patient has had a recent decline in their functional status and demonstrates the ability to make significant improvements in function in a reasonable and predictable amount of time.     Precautions / Restrictions Precautions Precautions: Fall Restrictions Weight  Bearing Restrictions Per Provider Order: No      Mobility  Bed Mobility Overal bed mobility: Needs Assistance Bed Mobility: Supine to Sit     Supine to sit: Mod assist, HOB elevated     General bed mobility comments: assist for trunk and scooting to EOB; vc's for technique    Transfers Overall transfer level: Needs assistance Equipment used: Rolling walker (2 wheels) Transfers: Sit to/from Stand Sit to Stand: Mod assist           General transfer comment: x2 trials standing from bed; vc's for UE placement; assist to initiate and come to full stand; assist to control descent sitting    Ambulation/Gait Ambulation/Gait assistance: Min assist Gait Distance (Feet): 3 Feet (bed to recliner) Assistive device: Rolling walker (2 wheels)   Gait velocity: decreased     General Gait Details: L LE externally rotated; shuffling gait; assist to steady  Stairs            Wheelchair Mobility     Tilt Bed    Modified Rankin (Stroke Patients Only)       Balance Overall balance assessment: Needs assistance Sitting-balance support: No upper extremity supported, Feet supported Sitting balance-Leahy Scale: Fair Sitting balance - Comments: steady static sitting   Standing balance support: Bilateral upper extremity supported, Reliant on assistive device for balance Standing balance-Leahy Scale: Poor Standing balance comment: CGA for safety in standing (use of RW)                             Pertinent Vitals/Pain Pain Assessment Pain Assessment: No/denies pain HR 96-112 bpm during sessions activities;  SpO2 sats stable on room air.    Home Living Family/patient expects to be discharged to:: Private residence Living Arrangements: Alone Available Help at Discharge: Friend(s);Neighbor;Personal care attendant Type of Home: House Home Access: Ramped entrance       Home Layout: One level Home Equipment: Cane - single point;Rollator (4 wheels);Wheelchair -  manual;Shower seat - built in;Grab bars - tub/shower;Grab bars - toilet;Other (comment) (3ww)      Prior Function Prior Level of Function : Needs assist             Mobility Comments: Modified independent ambulating with rollator; uses manual w/c in community.  No recent falls reported. ADLs Comments: Has aide 5 days a week (8:30am-12:30pm).  Has assist for laundry, cleaning home, meals, medication, and showers.  Her neighbor assists during the day and night as needed.     Extremity/Trunk Assessment   Upper Extremity Assessment Upper Extremity Assessment: Generalized weakness    Lower Extremity Assessment Lower Extremity Assessment: Generalized weakness       Communication   Communication Communication: No apparent difficulties    Cognition Arousal: Alert Behavior During Therapy: WFL for tasks assessed/performed   PT - Cognitive impairments: No apparent impairments                         Following commands: Intact       Cueing Cueing Techniques: Verbal cues     General Comments General comments (skin integrity, edema, etc.): Pt noted to be incontinent of urine and BM in bed; NT came during session to assist with pt care/clean-up    Exercises     Assessment/Plan    PT Assessment Patient needs continued PT services  PT Problem List Decreased strength;Decreased activity tolerance;Decreased balance;Decreased mobility       PT Treatment Interventions DME instruction;Gait training;Functional mobility training;Therapeutic activities;Therapeutic exercise;Balance training;Patient/family education    PT Goals (Current goals can be found in the Care Plan section)  Acute Rehab PT Goals Patient Stated Goal: to improve strength and walking PT Goal Formulation: With patient Time For Goal Achievement: 10/01/23 Potential to Achieve Goals: Good    Frequency Min 2X/week     Co-evaluation               AM-PAC PT "6 Clicks" Mobility  Outcome Measure  Help needed turning from your back to your side while in a flat bed without using bedrails?: A Little Help needed moving from lying on your back to sitting on the side of a flat bed without using bedrails?: A Lot Help needed moving to and from a bed to a chair (including a wheelchair)?: A Little Help needed standing up from a chair using your arms (e.g., wheelchair or bedside chair)?: A Lot Help needed to walk in hospital room?: Total Help needed climbing 3-5 steps with a railing? : Total 6 Click Score: 12    End of Session Equipment Utilized During Treatment: Gait belt Activity Tolerance: Patient limited by fatigue Patient left: in chair;with call bell/phone within reach;with chair alarm set;with nursing/sitter in room;Other (comment) (NT present placing purewick; NT reported she would finish setting up pt once done) Nurse Communication: Mobility status;Precautions PT Visit Diagnosis: Other abnormalities of gait and mobility (R26.89);Muscle weakness (generalized) (M62.81)    Time: 9629-5284 PT Time Calculation (min) (ACUTE ONLY): 27 min   Charges:   PT Evaluation $PT Eval Low Complexity: 1 Low PT Treatments $Therapeutic Activity: 8-22 mins PT General Charges $$ ACUTE PT VISIT:  1 Visit        Hendricks Limes, PT 09/17/23, 5:14 PM

## 2023-09-17 NOTE — Progress Notes (Signed)
 Initial Nutrition Assessment  DOCUMENTATION CODES:   Not applicable  INTERVENTION:   -Obtain new wt -Continue regular diet -Continue Ensure Enlive po TID, each supplement provides 350 kcal and 20 grams of protein -MVI with minerals daily  NUTRITION DIAGNOSIS:   Increased nutrient needs related to chronic illness (CHF) as evidenced by estimated needs.  GOAL:   Patient will meet greater than or equal to 90% of their needs  MONITOR:   PO intake, Supplement acceptance  REASON FOR ASSESSMENT:   Consult Assessment of nutrition requirement/status  ASSESSMENT:   Pt with medical history significant of HTN, HLD, presented with worsening of generalized weakness exertional dyspnea.  Pt admitted with CHF decompensation and symptomatic anemia.   Reviewed I/O's: +224 ml x 24 hours  UOP: 800 ml x 24 hours   Pt unavailable at time of visit. Attempted to speak with pt via call to hospital room phone, however, unable to reach. RD unable to obtain further nutrition-related history or complete nutrition-focused physical exam at this time.    Per MD notes, pt with acute on chronic anemia, suspicious for chronic slow GI bleed. GI consult pending.   Per H&P, pt has lost 40 pounds since November secondary to poor appetite. Pt familiar to this RD due to prior admissions. Pt has been identified with moderate malnutrition in the past, due to weight loss and poor appetite. RD suspects diagnosis is ongoing. Pt has access to dentures, but often does not use them when eating.   Pt currently on a regular diet. No meal completion data available to assess at this time. Agree with liberalized diet for widest variety of meal selections. RD will also add supplements. Pt agreeable to supplements in the past.   Last weight recorded since 07/18/23. RD will obtain new wt to better assess acute weight changes. Pt with mild edema per nursing assessment; suspect that edema may be masking true weight loss as well as  fat and muscle depletions.   Medications reviewed and include lasix, magnesium oxide, protonix, miralax, and senokot.  Labs reviewed.   Diet Order:   Diet Order             Diet regular Room service appropriate? Yes; Fluid consistency: Thin  Diet effective now                   EDUCATION NEEDS:   No education needs have been identified at this time  Skin:  Skin Assessment: Reviewed RN Assessment  Last BM:  09/16/23 (type 6)  Height:   Ht Readings from Last 1 Encounters:  07/18/23 5\' 5"  (1.651 m)    Weight:   Wt Readings from Last 1 Encounters:  07/18/23 55.4 kg    Ideal Body Weight:  56.8 kg  BMI:  There is no height or weight on file to calculate BMI.  Estimated Nutritional Needs:   Kcal:  1500-1700  Protein:  75-90 grams  Fluid:  1.5-1.7 L    Levada Schilling, RD, LDN, CDCES Registered Dietitian III Certified Diabetes Care and Education Specialist If unable to reach this RD, please use "RD Inpatient" group chat on secure chat between hours of 8am-4 pm daily

## 2023-09-17 NOTE — Consult Note (Signed)
 Judy [name] , MD [address], [place], [place], [address] [address], [address], [place], [place], [address] Phone: [phone]  Fax: [phone]  Consultation  Referring Provider:     Emergency room Primary Care Physician:  Lyndon Code, MD Primary Gastroenterologist: None Reason for Consultation:     Iron deficiency anemia  Date of Admission:  09/16/2023 Date of Consultation:  09/17/2023         HPI:   Judy [name] is a 89 y.o. female with a history of hypertension presented with dyspnea in the emergency room her hemoglobin was found to be 4 g with an MCV of 72 and I was consulted.  No overt history of blood loss.  Review of epic suggests no GI procedures in the past.  Presently being treated for acute on chronic decompensated heart failure secondary to anemia the patient has had a history of unintentional weight loss.  Ultrasound in July 2024 showed calcified gallbladder mass MRI was suggested to evaluate further.  Was not obtained.  09/17/2023: Ferritin 22, TIBC 284 iron percentage saturation 4.0.  Overnight received blood transfusion this morning hemoglobin is 7.4 g Creatinine on admission was 0.99 baseline is 0.63 Ultrasound abdomen shows a possible large gallstone or ductal dilation lesion seen in the liver measuring 2.5 cm not clearly seen previously recommended MRI or dynamic CT when clinically appropriate..   Denies any rectal bleeding , nose bleeds, hematemesis or overt blood loss. She had her neighbors and friends at her bedisde when I spoke with her. She denies any pain. Shortness of breath is better after transfusion but not resolved.  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   transferred from [facility] to [facility] on 10/12/19, discharged from ED    Past Surgical History:  Procedure Laterality Date   CATARACT EXTRACTION W/PHACO Left 01/21/2020   Procedure:  CATARACT EXTRACTION PHACO AND INTRAOCULAR LENS PLACEMENT (IOC) LEFT;  Surgeon: Nevada Crane, MD;  Location: [facility];  Service: Ophthalmology;  Laterality: Left;   CATARACT EXTRACTION W/PHACO Right 03/10/2020   Procedure: CATARACT EXTRACTION PHACO AND INTRAOCULAR LENS PLACEMENT (IOC) RIGHT 4.49  00:44.5;  Surgeon: Nevada Crane, MD;  Location: [facility];  Service: Ophthalmology;  Laterality: Right;   TOTAL HIP ARTHROPLASTY Right 2009    Prior to Admission medications   Medication Sig Start Date End Date Taking? Authorizing Provider  acetaminophen (TYLENOL) 500 MG tablet Take 2 tablets (1,000 mg total) by mouth every 8 (eight) hours as needed. 03/28/23  Yes Medina-Vargas, Monina C, NP  aspirin 81 MG tablet Take 81 mg by mouth daily.   Yes [provider]  bisacodyl 5 MG EC tablet Take 1 tablet (5 mg total) by mouth daily as needed for moderate constipation. 04/15/23  Yes Scoggins, Hospital doctor, NP  cholecalciferol (VITAMIN D3) 25 MCG (1000 UNIT) tablet Take 1 tablet (25 mcg total) by mouth daily. 03/28/23  Yes Medina-Vargas, Monina C, NP  diclofenac Sodium (VOLTAREN) 1 % GEL Apply 2 g topically every 6 (six) hours as needed. 03/28/23  Yes Medina-Vargas, Monina C, NP  furosemide (LASIX) 20 MG tablet Take 1 tablet (20 mg total) by mouth daily as needed. Every 24 hours as needed 03/28/23  Yes Medina-Vargas, Monina C, NP  loratadine (CLARITIN) 10 MG tablet Take 1 tablet (10 mg total) by mouth daily. 06/16/23  Yes Scoggins, Hospital doctor,  NP  magnesium oxide (MAG-OX) 400 (240 Mg) MG tablet Take 1 tablet (400 mg total) by mouth 2 (two) times daily. 03/28/23  Yes Medina-Vargas, Monina C, NP  Multiple Vitamin (MULTIVITAMIN WITH MINERALS) TABS tablet Take 1 tablet by mouth daily. 01/26/23  Yes Enedina Finner, MD  ondansetron (ZOFRAN) 4 MG tablet Take 1 tablet (4 mg total) by mouth every 8 (eight) hours as needed for nausea or vomiting. 03/28/23  Yes Medina-Vargas, Monina C, NP  oxybutynin  (DITROPAN) 5 MG tablet Take 5 mg by mouth 2 (two) times daily.   Yes [provider]  pantoprazole (PROTONIX) 40 MG tablet Take 1 tablet (40 mg total) by mouth daily. 03/28/23 09/16/23 Yes Medina-Vargas, Monina C, NP  polyethylene glycol (MIRALAX / GLYCOLAX) 17 g packet Take 17 g by mouth every other day. 03/28/23  Yes Medina-Vargas, Monina C, NP  senna (SENOKOT) 8.6 MG TABS tablet Take 1 tablet (8.6 mg total) by mouth 2 (two) times daily. 03/28/23  Yes Medina-Vargas, Monina C, NP  traMADol (ULTRAM) 50 MG tablet Take 0.5 tablets (25 mg total) by mouth every 8 (eight) hours as needed. 03/28/23  Yes Medina-Vargas, Monina C, NP  vitamin E (VITAMIN E) 180 MG (400 UNITS) capsule Take 400 Units by mouth daily.   Yes [provider]  feeding supplement (ENSURE ENLIVE / ENSURE PLUS) LIQD Take 237 mLs by mouth 3 (three) times daily between meals. 01/25/23   Enedina Finner, MD    No family history on file.   Social History   Tobacco Use   Smoking status: Never   Smokeless tobacco: Never  Substance Use Topics   Alcohol use: No   Drug use: Never    Allergies as of 09/16/2023   (No Known Allergies)    Review of Systems:    All systems reviewed and negative except where noted in HPI.   Physical Exam:  Vital signs in last 24 hours: Temp:  [95.5 F (35.3 C)-98.6 F (37 C)] 97.4 F (36.3 C) (04/05 0816) Pulse Rate:  [80-92] 87 (04/05 0816) Resp:  [18-24] 20 (04/05 0448) BP: (70-166)/(54-88) 129/64 (04/05 0816) SpO2:  [95 %-100 %] 100 % (04/05 0816) Last BM Date : 09/16/23 General:   Pleasant, cooperative in NAD Head:  Normocephalic and atraumatic. Eyes:   No icterus.   Conjunctiva pink. PERRLA. Ears:  Normal auditory acuity. Neck:  Supple; no masses or thyroidomegaly Lungs: Respirations even and unlabored. Lungs clear to auscultation bilaterally.   No wheezes, crackles, or rhonchi.  Heart:  Regular rate and rhythm;  Without murmur, clicks, rubs or gallops Abdomen:  Soft,  nondistended, nontender. Normal bowel sounds. No appreciable masses or hepatomegaly.  No rebound or guarding.  Neurologic:  Alert and oriented x3;  grossly normal neurologically. Skin:  Intact without significant lesions or rashes. Cervical Nodes:  No significant cervical adenopathy. Psych:  Alert and cooperative. Normal affect.  LAB RESULTS: Recent Labs    09/16/23 1220 09/16/23 1253 09/16/23 2300 09/17/23 0532  WBC 9.9 11.0*  --  14.5*  HGB 4.0* 3.9* 7.8* 7.4*  HCT 15.5* 14.7* 24.2* 23.0*  PLT 700* 690*  --  489*   BMET Recent Labs    09/16/23 1220 09/17/23 0532  NA 138 137  K 4.1 3.5  CL 111 109  CO2 15* 22  GLUCOSE 162* 75  BUN 15 16  CREATININE 0.99 0.94  CALCIUM 7.7* 7.6*   LFT No results for input(s): "PROT", "ALBUMIN", "AST", "ALT", "ALKPHOS", "BILITOT", "BILIDIR", "IBILI" in the  last 72 hours. PT/INR No results for input(s): "LABPROT", "INR" in the last 72 hours.  STUDIES: US Abdomen Limited RUQ (LIVER/GB) Result Date: 09/16/2023 CLINICAL DATA:  Gallbladder lesion. EXAM: ULTRASOUND ABDOMEN LIMITED RIGHT UPPER QUADRANT COMPARISON:  Ultrasound 01/10/2023.  CT 01/16/2023 and older FINDINGS: Gallbladder: Shadowing echogenic areas in the area of the gallbladder as on prior ultrasound. Based on prior CT this could be a large stone. No adjacent fluid. Gallbladder wall is measured at 3 mm, borderline. Common bile duct: Diameter: 4 mm Liver: Hypoechoic area identified in the right hepatic lobe measuring 2.5 x 2.5 x 2.1 cm. Uncertain etiology. Portal vein is patent on color Doppler imaging with normal direction of blood flow towards the liver. Other: Right-sided pleural effusion IMPRESSION: Echogenic shadowing focus identified in the area of the gallbladder. Based on prior CT imaging this could be a large gallstone. Borderline gallbladder wall of 3 mm. No ductal dilatation. Hypoechoic liver lesion seen measuring 2.5 cm. Not clearly seen previously. Overall would recommend further  evaluation of liver lesion with pre and postcontrast MRI or dynamic contrast CT when clinically appropriate to confirm etiology Electronically Signed   By: Karen Kays M.D.   On: 09/16/2023 16:46   DG Chest 1 View Result Date: 09/16/2023 CLINICAL DATA:  [id number] CHF (congestive heart failure) (HCC) [address] EXAM: CHEST  1 VIEW COMPARISON:  01/19/2023. FINDINGS: There is subtle blunting of bilateral lateral costophrenic angles, suggesting trace pleural effusions. There is interval decrease when compared to the prior exam from 01/19/2023. Bilateral lung fields are otherwise clear. No acute consolidation, lung collapse or pulmonary edema. No pneumothorax. Stable cardio-mediastinal silhouette. No acute osseous abnormalities. Redemonstration of marked degenerative changes of bilateral shoulder joints. Unfused left lateral clavicular fracture noted. The soft tissues are within normal limits. IMPRESSION: Trace bilateral pleural effusions, decreased since the prior study. Electronically Signed   By: Jules Schick M.D.   On: 09/16/2023 16:37   US Venous Img Lower Unilateral Left Result Date: 09/16/2023 CLINICAL DATA:  Left lower extremity edema for several days EXAM: Left LOWER EXTREMITY VENOUS DOPPLER ULTRASOUND TECHNIQUE: Gray-scale sonography with compression, as well as color and duplex ultrasound, were performed to evaluate the deep venous system(s) from the level of the common femoral vein through the popliteal and proximal calf veins. COMPARISON:  None Available. FINDINGS: VENOUS Normal compressibility of the common femoral, superficial femoral, and popliteal veins, as well as the visualized calf veins. Visualized portions of profunda femoral vein and great saphenous vein unremarkable. No filling defects to suggest DVT on grayscale or color Doppler imaging. Doppler waveforms show normal direction of venous flow, normal respiratory plasticity and response to augmentation. Limited views of the contralateral common  femoral vein are unremarkable. OTHER Complex cystic area in the popliteal fossa with some internal echoes but no blood flow on Doppler measures 2.4 x 4.2 x 1.3 cm. Limitations: none IMPRESSION: No evidence of left lower extremity DVT. Complex left-sided popliteal fossa cystic lesion. This very well could be a Baker's cyst. Please correlate with history and confirmatory evaluation as clinically appropriate Electronically Signed   By: Karen Kays M.D.   On: 09/16/2023 14:51      Impression / Plan:   Judy [name] is a 89 y.o. y/o female with history of right upper quadrant mass seen in the liver on ultrasound over a year back further evaluation was not performed.  Has a history of unintentional weight loss presents to the hospital with fatigue and dyspnea found to have severe iron  deficiency anemia with a hemoglobin of 4 g MCV of 72 and a ferritin of 22.  There appears to be a component of possible chronic anemia as the TIBC is not elevated or may have a B12 deficiency concurrently.  With a history of weight loss liver masses concerning for underlying malignancy.  Plan 1.  Monitor CBC transfuse as needed 2.  Check B12 levels 3.  IV iron 4.  I had a long discussion with the patient - discussed risks vs benefits of any endoscopic procedures at her age- she understood and doesn't want any invasive procedures due to risks at her age. Did discuss briefly goals of care and she would like to speak to palliative care for help at home with any symptoms.  Explained to her if she changes her mind to let us know.   I will sign off.  Please call me if any further GI concerns or questions.  We would like to thank you for the opportunity to participate in the care of ANNSLEY AKKERMAN.   Thank you for involving me in the care of this patient.      LOS: 1 day   Judy Mood, MD  09/17/2023, 10:32 AM

## 2023-09-18 DIAGNOSIS — I5033 Acute on chronic diastolic (congestive) heart failure: Secondary | ICD-10-CM | POA: Diagnosis not present

## 2023-09-18 LAB — CBC
HCT: 22.4 % — ABNORMAL LOW (ref 36.0–46.0)
Hemoglobin: 7.3 g/dL — ABNORMAL LOW (ref 12.0–15.0)
MCH: 24.4 pg — ABNORMAL LOW (ref 26.0–34.0)
MCHC: 32.6 g/dL (ref 30.0–36.0)
MCV: 74.9 fL — ABNORMAL LOW (ref 80.0–100.0)
Platelets: 454 10*3/uL — ABNORMAL HIGH (ref 150–400)
RBC: 2.99 MIL/uL — ABNORMAL LOW (ref 3.87–5.11)
RDW: 21.3 % — ABNORMAL HIGH (ref 11.5–15.5)
WBC: 11.7 10*3/uL — ABNORMAL HIGH (ref 4.0–10.5)
nRBC: 0.6 % — ABNORMAL HIGH (ref 0.0–0.2)

## 2023-09-18 LAB — BASIC METABOLIC PANEL WITH GFR
Anion gap: 7 (ref 5–15)
BUN: 17 mg/dL (ref 8–23)
CO2: 25 mmol/L (ref 22–32)
Calcium: 7.4 mg/dL — ABNORMAL LOW (ref 8.9–10.3)
Chloride: 102 mmol/L (ref 98–111)
Creatinine, Ser: 0.76 mg/dL (ref 0.44–1.00)
GFR, Estimated: >60 mL/min (ref >60)
Glucose, Bld: 102 mg/dL — ABNORMAL HIGH (ref 70–99)
Potassium: 3.1 mmol/L — ABNORMAL LOW (ref 3.5–5.1)
Sodium: 134 mmol/L — ABNORMAL LOW (ref 135–145)

## 2023-09-18 LAB — MAGNESIUM: Magnesium: 1.8 mg/dL (ref 1.7–2.4)

## 2023-09-18 MED ORDER — FUROSEMIDE 10 MG/ML IJ SOLN
20.0000 mg | Freq: Every day | INTRAMUSCULAR | Status: DC
  Administered 2023-09-19 – 2023-09-20 (×2): 20 mg via INTRAVENOUS
  Filled 2023-09-18 (×2): qty 2

## 2023-09-18 MED ORDER — POTASSIUM CHLORIDE CRYS ER 20 MEQ PO TBCR
40.0000 meq | EXTENDED_RELEASE_TABLET | Freq: Two times a day (BID) | ORAL | Status: AC
  Administered 2023-09-18 (×2): 40 meq via ORAL
  Filled 2023-09-18 (×2): qty 2

## 2023-09-18 NOTE — Evaluation (Addendum)
 Occupational Therapy Evaluation Patient Details Name: Judy [name] MRN: [id number] DOB: December 05, 1925 Today's Date: 09/18/2023   History of Present Illness   Pt is a 89 y.o. female presenting to hospital 09/16/23 with c/o weakness and exertional dyspnea; occasionally feeling lightheaded (especially when she stands up); pain in L knee (chronic).  Pt admitted with acute on chronic HFpEF decompensation, acute on chronic symptomatic anemia, and unintentional weight loss.  PMH includes htn, HLD, heart murmur, traumatic SDH 2021, R THA.     Clinical Impressions Chart reviewed, pt greeted in bed, oriented to self and place, agreeable to OT evaluation. Increased time required for all one step direction following with deficits noted during sequencing tasks. Friend/family in room report she is not at baseline. PTA pt reports she id MOD I in ADL however neighbor reports she has an aid who assists and neighbor/friends also assist for all ADLs. They report over the last two weeks she has gotten so weak she needs help to get out of bed. She will transfer to the lift chair or her wheelchair. Prior to that she required less assist for mobility.  Pt presents with deficits in strength, endurance, activity tolerance, balance, cognition, affecting safe and optimal ADL completion. MAX A +1-2 required for bed mobility, STS with MAX A (pt with difficulties sequencing using RW on this date). MAX A +1-2 for SPT to bsc and back. MAX A for toileting on bsc, LB dressing. Sheets wet therefore full linen change provided with assist of NT. Educated pt/friends re: delirium precautions and importance of providing opportunities for mobility. Pt is left as received, all needs met. OT will follow acutely.      If plan is discharge home, recommend the following:   Two people to help with walking and/or transfers;Two people to help with bathing/dressing/bathroom;Supervision due to cognitive status     Functional Status  Assessment   Patient has had a recent decline in their functional status and demonstrates the ability to make significant improvements in function in a reasonable and predictable amount of time.     Equipment Recommendations   [employer];Hospital bed;Wheelchair (measurements OT)     Recommendations for Other Services         Precautions/Restrictions   Precautions Precautions: Fall Recall of Precautions/Restrictions: Impaired     Mobility Bed Mobility Overal bed mobility: Needs Assistance Bed Mobility: Supine to Sit, Sit to Supine     Supine to sit: Max assist, HOB elevated Sit to supine: Max assist, HOB elevated, +2 for physical assistance   General bed mobility comments: frequent vcs for technique    Transfers Overall transfer level: Needs assistance Equipment used: Rolling walker (2 wheels), None Transfers: Sit to/from Stand Sit to Stand: Max assist, +2 physical assistance           General transfer comment: with RW unable to come to full upright standing (potentially due to cognition deficits/sequencing deficits on this date),      Balance Overall balance assessment: Needs assistance Sitting-balance support: No upper extremity supported, Feet supported Sitting balance-Leahy Scale: Fair     Standing balance support: Bilateral upper extremity supported, Reliant on assistive device for balance Standing balance-Leahy Scale: Poor                             ADL either performed or assessed with clinical judgement   ADL Overall ADL's : Needs assistance/impaired  Upper Body Dressing : Moderate assistance;Bed level Upper Body Dressing Details (indicate cue type and reason): donn/doff gown Lower Body Dressing: Maximal assistance;Bed level Lower Body Dressing Details (indicate cue type and reason): doff socks Toilet Transfer: Maximal assistance+1-2 Toilet Transfer Details (indicate cue type and reason): SPT to and from  bedside commode Toileting- Clothing Manipulation and Hygiene: Maximal assistance;Sitting/lateral lean               Vision Patient Visual Report: No change from baseline       Perception         Praxis         Pertinent Vitals/Pain Pain Assessment Pain Assessment: Faces Faces Pain Scale: Hurts a little bit Pain Location: L knee Pain Descriptors / Indicators: Discomfort Pain Intervention(s): Monitored during session, Repositioned     Extremity/Trunk Assessment Upper Extremity Assessment Upper Extremity Assessment: Generalized weakness   Lower Extremity Assessment Lower Extremity Assessment: Generalized weakness;LLE deficits/detail LLE Deficits / Details: L knee swollen, LLE externally rotated       Communication Communication Communication: No apparent difficulties   Cognition Arousal: Alert Behavior During Therapy: WFL for tasks assessed/performed Cognition: Cognition impaired   Orientation impairments: Time, Situation Awareness: Intellectual awareness impaired, Online awareness impaired Memory impairment (select all impairments): Short-term memory Attention impairment (select first level of impairment): Selective attention Executive functioning impairment (select all impairments): Reasoning, Problem solving OT - Cognition Comments: friends at bedside report cognition is not at baseline                 Following commands: Impaired Following commands impaired: Follows one step commands with increased time     Cueing  General Comments   Cueing Techniques: Verbal cues;Tactile cues;Visual cues  vss throughout   Exercises Other Exercises Other Exercises: edu pt/friend/neighbor re: role of OT, role of rehab, discharge recommendations, delirium prevention   Shoulder Instructions      Home Living Family/patient expects to be discharged to:: Private residence Living Arrangements: Alone Available Help at Discharge: Friend(s);Neighbor;Personal care  attendant Type of Home: House Home Access: Ramped entrance     Home Layout: One level     Bathroom Shower/Tub: Producer, television/film/video: Handicapped height     Home Equipment: Cane - single point;Rollator (4 wheels);Wheelchair - manual;Shower seat - built in;Grab bars - tub/shower;Grab bars - toilet;Lift chair          Prior Functioning/Environment Prior Level of Function : Needs assist             Mobility Comments: neighbor (who helps patient) reports but has been requiring lifting to get out of bed into wheelchair the last two weeks ADLs Comments: Has aide 5 days a week (8:30am-12:30pm).  Has assist for laundry, cleaning home, meals, medication, and showers.  Her neighbor assists during the day and night as needed.    OT Problem List: Decreased strength;Decreased activity tolerance;Decreased knowledge of use of DME or AE;Impaired balance (sitting and/or standing);Decreased cognition;Decreased safety awareness   OT Treatment/Interventions: Self-care/ADL training;Therapeutic exercise;Patient/family education;Balance training;DME and/or AE instruction;Energy conservation;Therapeutic activities;Cognitive remediation/compensation      OT Goals(Current goals can be found in the care plan section)   Acute Rehab OT Goals Patient Stated Goal: go home OT Goal Formulation: With patient Time For Goal Achievement: 10/02/23 Potential to Achieve Goals: Fair ADL Goals Pt Will Perform Grooming: with supervision;sitting Pt Will Perform Lower Body Dressing: with mod assist Pt Will Transfer to Toilet: with mod assist;stand pivot transfer Pt Will Perform Toileting -  Clothing Manipulation and hygiene: with mod assist;sitting/lateral leans;sit to/from stand   OT Frequency:  Min 2X/week    Co-evaluation              AM-PAC OT "6 Clicks" Daily Activity     Outcome Measure Help from another person eating meals?: A Little Help from another person taking care of personal  grooming?: A Little Help from another person toileting, which includes using toliet, bedpan, or urinal?: Total Help from another person bathing (including washing, rinsing, drying)?: Total Help from another person to put on and taking off regular upper body clothing?: A Lot Help from another person to put on and taking off regular lower body clothing?: A Lot 6 Click Score: 12   End of Session Equipment Utilized During Treatment: Rolling walker (2 wheels) Nurse Communication: Mobility status  Activity Tolerance: Patient limited by fatigue Patient left: in bed;with call bell/phone within reach;with bed alarm set;with family/visitor present  OT Visit Diagnosis: Other abnormalities of gait and mobility (R26.89);Muscle weakness (generalized) (M62.81)                Time: 0454-0981 OT Time Calculation (min): 35 min Charges:  OT General Charges $OT Visit: 1 Visit OT Evaluation $OT Eval Moderate Complexity: 1 Mod  Oleta Mouse, OTD OTR/L  09/18/23, 2:41 PM

## 2023-09-18 NOTE — Plan of Care (Signed)

## 2023-09-18 NOTE — Progress Notes (Signed)
 PROGRESS NOTE    Judy [name]  ZOX:[id number] DOB: [birth year] DOA: 09/16/2023 PCP: Lyndon Code, MD   Assessment & Plan:   Principal Problem:   CHF (congestive heart failure) (HCC) Active Problems:   Severe anemia  Assessment and Plan: Acute on chronic diastolic CHF: continue on IV lasix. Recent echo in 01/2023.   IDA: symptomatic. S/p 2 units of pRBCs transfused so far. Continue on PPI. Will transfuse if Hb < 7.0. Pt does not any any invasive procedures, so no EGD or colonoscopy. GI signed off   Thrombocytosis: etiology unclear. Will continue to monitor    Hx of RUQ mass: w/ unintentional weight loss. Etiology unclear. US shows 2.5 cm liver lesion, echogenic shadowing focus in area of gallbladder, possible large gallstone but no ductal dilatation. MRI liver ordered to further assess liver lesion but pt refused MRI and pt does not want to f/u on this  Moderate protein calorie malnutrition: continue on nutritional supplements   Deconditioning: PT/OT recs SNF  Failure to thrive: secondary to all above. Palliative care consulted       DVT prophylaxis: SCDs Code Status: DNR Family Communication: discussed pt's care w/ pt's medical POA, Hector & [name], and answered their questions Disposition Plan: likely d/c back home  Level of care: Telemetry Cardiac  Status is: Inpatient Remains inpatient appropriate because: severity of illness    Consultants:  GI   Procedures:   Antimicrobials:   Subjective: Pt c/o generalized weakness  Objective: Vitals:   09/17/23 2021 09/17/23 2352 09/18/23 0505 09/18/23 0829  BP: (!) 119/58 (!) 142/78 (!) 160/67 (!) 105/56  Pulse: 85 91 89 78  Resp: 16 14 17 16   Temp: 97.9 F (36.6 C) 98.1 F (36.7 C) 97.9 F (36.6 C) (!) 97.5 F (36.4 C)  TempSrc:  Oral Oral   SpO2: 97% 98% 97% 94%  Weight: 55.3 kg  51.4 kg   Height: 5\' 2"  (1.575 m)       Intake/Output Summary (Last 24 hours) at 09/18/2023 0858 Last data filed at  09/18/2023 0629 Gross per 24 hour  Intake 440 ml  Output 1000 ml  Net -560 ml   Filed Weights   09/17/23 2021 09/18/23 0505  Weight: 55.3 kg 51.4 kg    Examination:  General exam: Appears uncomfortable. Frail appearing  Respiratory system: diminished breath sounds b/l  Cardiovascular system: S1/S2+. No rubs or gallops  Gastrointestinal system: abd is soft, NT, ND & hypoactive bowel sounds  Central nervous system: alert & awake. Moves all extremities  Psychiatry: Judgement and insight appears at baseline. Flat mood and affect    Data Reviewed: I have personally reviewed following labs and imaging studies  CBC: Recent Labs  Lab 09/16/23 1220 09/16/23 1253 09/16/23 2300 09/17/23 0532 09/18/23 0709  WBC 9.9 11.0*  --  14.5* 11.7*  HGB 4.0* 3.9* 7.8* 7.4* 7.3*  HCT 15.5* 14.7* 24.2* 23.0* 22.4*  MCV 75.2* 72.1*  --  75.9* 74.9*  PLT 700* 690*  --  489* 454*   Basic Metabolic Panel: Recent Labs  Lab 09/16/23 1220 09/17/23 0532 09/18/23 0709  NA 138 137 134*  K 4.1 3.5 3.1*  CL 111 109 102  CO2 15* 22 25  GLUCOSE 162* 75 102*  BUN 15 16 17   CREATININE 0.99 0.94 0.76  CALCIUM 7.7* 7.6* 7.4*   GFR: Estimated Creatinine Clearance: 33.3 mL/min (by C-G formula based on SCr of 0.76 mg/dL). Liver Function Tests: No results for input(s): "AST", "ALT", "ALKPHOS", "BILITOT", "  PROT", "ALBUMIN" in the last 168 hours. No results for input(s): "LIPASE", "AMYLASE" in the last 168 hours. No results for input(s): "AMMONIA" in the last 168 hours. Coagulation Profile: No results for input(s): "INR", "PROTIME" in the last 168 hours. Cardiac Enzymes: No results for input(s): "CKTOTAL", "CKMB", "CKMBINDEX", "TROPONINI" in the last 168 hours. BNP (last 3 results) No results for input(s): "PROBNP" in the last 8760 hours. HbA1C: No results for input(s): "HGBA1C" in the last 72 hours. CBG: No results for input(s): "GLUCAP" in the last 168 hours. Lipid Profile: No results for  input(s): "CHOL", "HDL", "LDLCALC", "TRIG", "CHOLHDL", "LDLDIRECT" in the last 72 hours. Thyroid Function Tests: No results for input(s): "TSH", "T4TOTAL", "FREET4", "T3FREE", "THYROIDAB" in the last 72 hours. Anemia Panel: Recent Labs    09/16/23 1220 09/17/23 1114  VITAMINB12  --  607  FOLATE  --  5.8*  FERRITIN 22  --   TIBC 284  --   IRON 10*  --   RETICCTPCT 2.5  --    Sepsis Labs: No results for input(s): "PROCALCITON", "LATICACIDVEN" in the last 168 hours.  No results found for this or any previous visit (from the past 240 hours).       Radiology Studies: US Abdomen Limited RUQ (LIVER/GB) Result Date: 09/16/2023 CLINICAL DATA:  Gallbladder lesion. EXAM: ULTRASOUND ABDOMEN LIMITED RIGHT UPPER QUADRANT COMPARISON:  Ultrasound 01/10/2023.  CT 01/16/2023 and older FINDINGS: Gallbladder: Shadowing echogenic areas in the area of the gallbladder as on prior ultrasound. Based on prior CT this could be a large stone. No adjacent fluid. Gallbladder wall is measured at 3 mm, borderline. Common bile duct: Diameter: 4 mm Liver: Hypoechoic area identified in the right hepatic lobe measuring 2.5 x 2.5 x 2.1 cm. Uncertain etiology. Portal vein is patent on color Doppler imaging with normal direction of blood flow towards the liver. Other: Right-sided pleural effusion IMPRESSION: Echogenic shadowing focus identified in the area of the gallbladder. Based on prior CT imaging this could be a large gallstone. Borderline gallbladder wall of 3 mm. No ductal dilatation. Hypoechoic liver lesion seen measuring 2.5 cm. Not clearly seen previously. Overall would recommend further evaluation of liver lesion with pre and postcontrast MRI or dynamic contrast CT when clinically appropriate to confirm etiology Electronically Signed   By: Karen Kays M.D.   On: 09/16/2023 16:46   DG Chest 1 View Result Date: 09/16/2023 CLINICAL DATA:  97293 CHF (congestive heart failure) (HCC) [address]293 EXAM: CHEST  1 VIEW COMPARISON:   01/19/2023. FINDINGS: There is subtle blunting of bilateral lateral costophrenic angles, suggesting trace pleural effusions. There is interval decrease when compared to the prior exam from 01/19/2023. Bilateral lung fields are otherwise clear. No acute consolidation, lung collapse or pulmonary edema. No pneumothorax. Stable cardio-mediastinal silhouette. No acute osseous abnormalities. Redemonstration of marked degenerative changes of bilateral shoulder joints. Unfused left lateral clavicular fracture noted. The soft tissues are within normal limits. IMPRESSION: Trace bilateral pleural effusions, decreased since the prior study. Electronically Signed   By: Jules Schick M.D.   On: 09/16/2023 16:37   US Venous Img Lower Unilateral Left Result Date: 09/16/2023 CLINICAL DATA:  Left lower extremity edema for several days EXAM: Left LOWER EXTREMITY VENOUS DOPPLER ULTRASOUND TECHNIQUE: Gray-scale sonography with compression, as well as color and duplex ultrasound, were performed to evaluate the deep venous system(s) from the level of the common femoral vein through the popliteal and proximal calf veins. COMPARISON:  None Available. FINDINGS: VENOUS Normal compressibility of the common femoral, superficial  femoral, and popliteal veins, as well as the visualized calf veins. Visualized portions of profunda femoral vein and great saphenous vein unremarkable. No filling defects to suggest DVT on grayscale or color Doppler imaging. Doppler waveforms show normal direction of venous flow, normal respiratory plasticity and response to augmentation. Limited views of the contralateral common femoral vein are unremarkable. OTHER Complex cystic area in the popliteal fossa with some internal echoes but no blood flow on Doppler measures 2.4 x 4.2 x 1.3 cm. Limitations: none IMPRESSION: No evidence of left lower extremity DVT. Complex left-sided popliteal fossa cystic lesion. This very well could be a Baker's cyst. Please correlate  with history and confirmatory evaluation as clinically appropriate Electronically Signed   By: Karen Kays M.D.   On: 09/16/2023 14:51        Scheduled Meds:  aspirin EC  81 mg Oral Daily   feeding supplement  237 mL Oral TID BM   ferrous gluconate  324 mg Oral BID WC   furosemide  20 mg Intravenous BID   loratadine  10 mg Oral Daily   magnesium oxide  400 mg Oral BID   multivitamin with minerals  1 tablet Oral Daily   oxybutynin  5 mg Oral BID   pantoprazole  40 mg Oral BID   polyethylene glycol  17 g Oral QODAY   potassium chloride  40 mEq Oral BID   senna  1 tablet Oral BID   Continuous Infusions:   LOS: 2 days      Charise Killian, MD [facility] Pager [phone]  If 7PM-7AM, please contact night-coverage [email or web address] 09/18/2023, 8:58 AM

## 2023-09-19 DIAGNOSIS — I5033 Acute on chronic diastolic (congestive) heart failure: Secondary | ICD-10-CM | POA: Diagnosis not present

## 2023-09-19 DIAGNOSIS — E43 Unspecified severe protein-calorie malnutrition: Secondary | ICD-10-CM | POA: Insufficient documentation

## 2023-09-19 DIAGNOSIS — Z7189 Other specified counseling: Secondary | ICD-10-CM | POA: Diagnosis not present

## 2023-09-19 LAB — CBC
HCT: 24.5 % — ABNORMAL LOW (ref 36.0–46.0)
Hemoglobin: 7.5 g/dL — ABNORMAL LOW (ref 12.0–15.0)
MCH: 24.2 pg — ABNORMAL LOW (ref 26.0–34.0)
MCHC: 30.6 g/dL (ref 30.0–36.0)
MCV: 79 fL — ABNORMAL LOW (ref 80.0–100.0)
Platelets: 445 10*3/uL — ABNORMAL HIGH (ref 150–400)
RBC: 3.1 MIL/uL — ABNORMAL LOW (ref 3.87–5.11)
RDW: 22.5 % — ABNORMAL HIGH (ref 11.5–15.5)
WBC: 10.3 10*3/uL (ref 4.0–10.5)
nRBC: 0.6 % — ABNORMAL HIGH (ref 0.0–0.2)

## 2023-09-19 LAB — BASIC METABOLIC PANEL WITH GFR
Anion gap: 7 (ref 5–15)
BUN: 14 mg/dL (ref 8–23)
CO2: 25 mmol/L (ref 22–32)
Calcium: 7.5 mg/dL — ABNORMAL LOW (ref 8.9–10.3)
Chloride: 102 mmol/L (ref 98–111)
Creatinine, Ser: 0.71 mg/dL (ref 0.44–1.00)
GFR, Estimated: >60 mL/min (ref >60)
Glucose, Bld: 69 mg/dL — ABNORMAL LOW (ref 70–99)
Potassium: 3.8 mmol/L (ref 3.5–5.1)
Sodium: 134 mmol/L — ABNORMAL LOW (ref 135–145)

## 2023-09-19 LAB — CELIAC DISEASE PANEL
Endomysial Ab, IgA: NEGATIVE
IgA: 274 mg/dL (ref 64–422)
Tissue Transglutaminase Ab, IgA: <2 U/mL (ref 0–3)

## 2023-09-19 MED ORDER — ORAL CARE MOUTH RINSE: 15.0000 mL | OROMUCOSAL | Status: DC | PRN

## 2023-09-19 NOTE — Consult Note (Signed)
 Consultation Note Date: 09/19/2023   Patient Name: Judy [name]  DOB: 04-07-1926  MRN: [id number]  Age / Sex: 89 y.o., female  PCP: Lyndon Code, MD Referring Physician: Charise Killian, MD  Reason for Consultation: Establishing goals of care  HPI/Patient Profile: Per H&P "Judy [name] is a 89 y.o. female with medical history significant of HTN, HLD, presented with worsening of generalized weakness exertional dyspnea.  Neighbor visited patient this morning and patient complained about feeling worsening of generalized weakness and exertional dyspnea and difficulty walking."  Clinical Assessment and Goals of Care: Notes and labs reviewed.  In to see patient.  She is currently sitting in bed with attending team at bedside.  Patient is clear she would like to go home at discharge.  Patient has a paid private duty agency healthcare assistant at bedside, and a friend.  Patient and others at bedside agree that patient's friends Judy [name] and Judy [name] are her H POA's.  Attending departed conversation.  Patient states she has healthcare power of attorney papers that have been completed for Judy [name] and Judy [name] to be her HPOA's.  She advises that she was widowed around 2 and half years ago.  She states she never had children.  She says she has no further living relatives.  She discusses that prior to a few months ago, she had been able to do all of her shopping, cleaning, and care.  She advises she hired a home agency so that would have a companion as she was lonely.  The assistant confirmed this.  She discusses knee pain and states that over the past 6 months her knee pain has been increasing.  She states she had an injection around 3 weeks ago but it did not help.  She states because of the pain she does not felt like getting up and doing anything.  She states because of the pain, her oral intake has significantly  declined.   Patient is able to answer all orientation questions correctly including name, location, year, president, and why she is here.  She discusses her anemia upon admission, which is been corrected with blood transfusion.   We discussed her diagnosis, prognosis, GOC, EOL wishes disposition and options.  Created space and opportunity for patient  to explore thoughts and feelings regarding current medical information.   A detailed discussion was had today regarding advanced directives.  Concepts specific to code status, artifical feeding and hydration, IV antibiotics and rehospitalization were discussed.  The difference between an aggressive medical intervention path and a comfort care path was discussed.  Values and goals of care important to patient and family were attempted to be elicited.  Discussed limitations of medical interventions to prolong quality of life in some situations and discussed the concept of human mortality.  She discusses that she is a woman of Catholic faith, and believes in heaven.  She states that if her knee pain cannot be improved, she is ready to die as her quality of life is her ability to be independent  and do for herself.  She states that if her knee pain can be improved where she can regain her functional independence, she would like to continue to treat the treatable.  She would like to speak with orthopedics about another knee injection.  She states she will readdress her hemoglobin and wishes moving forward in regards to this, should it drop again.  She does confirm DNR/DNI, no feeding tube.  Attending updated following meeting.    SUMMARY OF RECOMMENDATIONS   Patient would like to speak with orthopedics about another knee injection as this pain is directly affecting her quality of life. She confirms DNR/DNI, no feeding tube She confirms that her neighbors Judy [name] and Judy [name] are her legal healthcare power of attorney's if she should require one in the  future.     Primary Diagnoses: Present on Admission: **None**   I have reviewed the medical record, interviewed the patient and family, and examined the patient. The following aspects are pertinent.  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   transferred from [facility] to [facility] on 10/12/19, discharged from ED   Social History   Socioeconomic History   Marital status: Married    Spouse name: Not on file   Number of children: Not on file   Years of education: Not on file   Highest education level: Not on file  Occupational History   Not on file  Tobacco Use   Smoking status: Never   Smokeless tobacco: Never  Substance and Sexual Activity   Alcohol use: No   Drug use: Never   Sexual activity: Not on file  Other Topics Concern   Not on file  Social History Narrative   Not on file   Social Drivers of Health   Financial Resource Strain: Not on file  Food Insecurity: No Food Insecurity (09/17/2023)   Hunger Vital Sign    Worried About Running Out of Food in the Last Year: Never true    Ran Out of Food in the Last Year: Never true  Transportation Needs: No Transportation Needs (09/17/2023)   PRAPARE - Administrator, Civil Service (Medical): No    Lack of Transportation (Non-Medical): No  Physical Activity: Not on file  Stress: Not on file  Social Connections: Unknown (09/17/2023)   Social Connection and Isolation Panel [NHANES]    Frequency of Communication with Friends and Family: Patient declined    Frequency of Social Gatherings with Friends and Family: Patient declined    Attends Religious Services: Patient declined    Database administrator or Organizations: Patient declined    Attends Banker Meetings: Patient declined    Marital Status: Married   History reviewed. No pertinent family history. Scheduled Meds:  feeding supplement  237 mL Oral TID BM    ferrous gluconate  324 mg Oral BID WC   furosemide  20 mg Intravenous Daily   loratadine  10 mg Oral Daily   magnesium oxide  400 mg Oral BID   multivitamin with minerals  1 tablet Oral Daily   oxybutynin  5 mg Oral BID   pantoprazole  40 mg Oral BID   polyethylene glycol  17 g Oral QODAY   senna  1 tablet Oral BID   Continuous Infusions: PRN Meds:.acetaminophen **OR** acetaminophen, bisacodyl, HYDROmorphone (DILAUDID) injection, ondansetron **OR** ondansetron (ZOFRAN) IV, mouth rinse, traMADol Medications Prior to  Admission:  Prior to Admission medications   Medication Sig Start Date End Date Taking? Authorizing Provider  acetaminophen (TYLENOL) 500 MG tablet Take 2 tablets (1,000 mg total) by mouth every 8 (eight) hours as needed. 03/28/23  Yes Medina-Vargas, Monina C, NP  aspirin 81 MG tablet Take 81 mg by mouth daily.   Yes [provider]  bisacodyl 5 MG EC tablet Take 1 tablet (5 mg total) by mouth daily as needed for moderate constipation. 04/15/23  Yes Scoggins, Hospital doctor, NP  cholecalciferol (VITAMIN D3) 25 MCG (1000 UNIT) tablet Take 1 tablet (25 mcg total) by mouth daily. 03/28/23  Yes Medina-Vargas, Monina C, NP  diclofenac Sodium (VOLTAREN) 1 % GEL Apply 2 g topically every 6 (six) hours as needed. 03/28/23  Yes Medina-Vargas, Monina C, NP  furosemide (LASIX) 20 MG tablet Take 1 tablet (20 mg total) by mouth daily as needed. Every 24 hours as needed 03/28/23  Yes Medina-Vargas, Monina C, NP  loratadine (CLARITIN) 10 MG tablet Take 1 tablet (10 mg total) by mouth daily. 06/16/23  Yes Scoggins, Amber, NP  magnesium oxide (MAG-OX) 400 (240 Mg) MG tablet Take 1 tablet (400 mg total) by mouth 2 (two) times daily. 03/28/23  Yes Medina-Vargas, Monina C, NP  Multiple Vitamin (MULTIVITAMIN WITH MINERALS) TABS tablet Take 1 tablet by mouth daily. 01/26/23  Yes Enedina Finner, MD  ondansetron (ZOFRAN) 4 MG tablet Take 1 tablet (4 mg total) by mouth every 8 (eight) hours as needed for  nausea or vomiting. 03/28/23  Yes Medina-Vargas, Monina C, NP  oxybutynin (DITROPAN) 5 MG tablet Take 5 mg by mouth 2 (two) times daily.   Yes [provider]  pantoprazole (PROTONIX) 40 MG tablet Take 1 tablet (40 mg total) by mouth daily. 03/28/23 09/16/23 Yes Medina-Vargas, Monina C, NP  polyethylene glycol (MIRALAX / GLYCOLAX) 17 g packet Take 17 g by mouth every other day. 03/28/23  Yes Medina-Vargas, Monina C, NP  senna (SENOKOT) 8.6 MG TABS tablet Take 1 tablet (8.6 mg total) by mouth 2 (two) times daily. 03/28/23  Yes Medina-Vargas, Monina C, NP  traMADol (ULTRAM) 50 MG tablet Take 0.5 tablets (25 mg total) by mouth every 8 (eight) hours as needed. 03/28/23  Yes Medina-Vargas, Monina C, NP  vitamin E (VITAMIN E) 180 MG (400 UNITS) capsule Take 400 Units by mouth daily.   Yes [provider]  feeding supplement (ENSURE ENLIVE / ENSURE PLUS) LIQD Take 237 mLs by mouth 3 (three) times daily between meals. 01/25/23   Enedina Finner, MD   No Known Allergies Review of Systems  Constitutional:  Positive for activity change.  Musculoskeletal:  Positive for arthralgias.    Physical Exam Pulmonary:     Effort: Pulmonary effort is normal.  Skin:    General: Skin is warm and dry.  Neurological:     Mental Status: She is alert.     Vital Signs: BP 121/70 (BP Location: Right Arm)   Pulse 83   Temp 97.7 F (36.5 C)   Resp 15   Ht 5\' 2"  (1.575 m)   Wt 51.4 kg   SpO2 99%   BMI 20.73 kg/m  Pain Scale: 0-10   Pain Score: 2    SpO2: SpO2: 99 % O2 Device:SpO2: 99 % O2 Flow Rate: .   IO: Intake/output summary:  Intake/Output Summary (Last 24 hours) at 09/19/2023 1147 Last data filed at 09/19/2023 0950 Gross per 24 hour  Intake 220 ml  Output 600 ml  Net -380 ml  LBM: Last BM Date : 09/16/23 Baseline Weight: Weight: 55.3 kg Most recent weight: Weight: 51.4 kg       Signed by: Morton Stall, NP   Please contact Palliative Medicine Team phone at [phone] for  questions and concerns.  For individual provider: See Loretha Stapler

## 2023-09-19 NOTE — Care Management Important Message (Signed)
 Important Message  Patient Details  Name: Judy [name] MRN: [id number] Date of Birth: 1925/06/26   Important Message Given:  Yes - Medicare IM     Marcell Anger 09/19/2023, 10:53 AM

## 2023-09-19 NOTE — Progress Notes (Addendum)
 Nutrition Follow-up  DOCUMENTATION CODES:   Severe malnutrition in context of chronic illness  INTERVENTION:   -Downgrade diet to dysphagia 2 for ease of intake  -Continue Ensure Enlive po TID, each supplement provides 350 kcal and 20 grams of protein -Continue MVI with minerals daily -Magic cup TID with meals, each supplement provides 290 kcal and 9 grams of protein   NUTRITION DIAGNOSIS:   Severe Malnutrition related to chronic illness (CHF) as evidenced by percent weight loss, moderate fat depletion, severe fat depletion, moderate muscle depletion, severe muscle depletion.  Ongoing  GOAL:   Patient will meet greater than or equal to 90% of their needs  Progressing   MONITOR:   PO intake, Supplement acceptance  REASON FOR ASSESSMENT:   Consult Assessment of nutrition requirement/status  ASSESSMENT:   Pt with medical history significant of HTN, HLD, presented with worsening of generalized weakness exertional dyspnea.  Reviewed I/O's: -500 ml x 24 hours and -836 ml since admission  UOP: 600 ml x 24 hours  Pt familiar to this RD due to multiple prior admissions. Noted pt has experienced a continued general decline in health. Pt appears much more depleted upon recollection of prior RD visits.   Per GI notes, pt with chronic anemia vs possible vitamin B-12 deficiency. Pt also with liver masses concerning for underlying malignancy. Pt denies any further invasive procedures or work-up/   Spoke with pt and friends at bedside. They reports pt has had a hard pain and is currently in pain. Pt requesting Tylenol. RN aware.   Pt reports decreased appetite. Noted pt with no teeth. Pt shares she has access to dentures at home, but not here. Pt continues to chew a piece of fruit in her mouth. She admits difficulty with chewing foods and is amenable to mechanically altered diet for ease of intake.   Pt drinking Ensure at bedside. Per pt, she drinks this at home and likes it. They  have been trying to encourage intake of Ensure.   Reviewed wt hx; pt has experienced a 7.2% wt loss over the past 2 months, which is significant for time frame.   Discussed importance of good meal and supplement intake to promote healing.   Palliative care consult pending for goals of care. Pt does not want a feeding tube.   Medications reviewed and include magnesium oxide, lasix, magnesium oxide, ditropan, protonix, miralax, and senokot.   Labs reviewed: Na: 134.   NUTRITION - FOCUSED PHYSICAL EXAM:  Flowsheet Row Most Recent Value  Orbital Region Severe depletion  Upper Arm Region Severe depletion  Thoracic and Lumbar Region Moderate depletion  Buccal Region Moderate depletion  Temple Region Moderate depletion  Clavicle Bone Region Severe depletion  Clavicle and Acromion Bone Region Severe depletion  Scapular Bone Region Severe depletion  Dorsal Hand Severe depletion  Patellar Region Severe depletion  Anterior Thigh Region Severe depletion  Posterior Calf Region Severe depletion  Edema (RD Assessment) None  Hair Reviewed  Eyes Reviewed  Mouth Reviewed  Skin Reviewed  Nails Reviewed       Diet Order:   Diet Order             DIET DYS 2 Room service appropriate? Yes; Fluid consistency: Thin  Diet effective now                   EDUCATION NEEDS:   Education needs have been addressed  Skin:  Skin Assessment: Reviewed RN Assessment  Last BM:  09/16/23  Height:  Ht Readings from Last 1 Encounters:  09/17/23 5\' 2"  (1.575 m)    Weight:   Wt Readings from Last 1 Encounters:  09/19/23 51.4 kg    Ideal Body Weight:  56.8 kg  BMI:  Body mass index is 20.73 kg/m.  Estimated Nutritional Needs:   Kcal:  1500-1700  Protein:  75-90 grams  Fluid:  1.5-1.7 L    Levada Schilling, RD, LDN, CDCES Registered Dietitian III Certified Diabetes Care and Education Specialist If unable to reach this RD, please use "RD Inpatient" group chat on secure chat between  hours of 8am-4 pm daily

## 2023-09-19 NOTE — Progress Notes (Signed)
 Physical Therapy Treatment Patient Details Name: Judy [name] MRN: [id number] DOB: Nov 04, 1925 Today's Date: 09/19/2023   History of Present Illness Pt is a 89 y.o. female presenting to hospital 09/16/23 with c/o weakness and exertional dyspnea; occasionally feeling lightheaded (especially when she stands up); pain in L knee (chronic).  Pt admitted with acute on chronic HFpEF decompensation, acute on chronic symptomatic anemia, and unintentional weight loss.  PMH includes htn, HLD, heart murmur, traumatic SDH 2021, R THA.    PT Comments  Pt resting in bed upon PT arrival; visitor present; pt agreeable to therapy.  Pt reporting L knee pain during sessions activities (moderate at rest; pain increased with activity; nurse present and aware).  During session pt mod assist semi-supine to sitting EOB; min to mod assist x2 to stand up to RW; and min assist x2 to walk a few feet bed to recliner with RW use.  Pt requiring increased assist with functional mobility today compared to initial PT evaluation Saturday (pt required 2 assist with OT yesterday/Sunday).  Will continue to focus on strengthening and progressive functional mobility during hospitalization.   If plan is discharge home, recommend the following: A lot of help with bathing/dressing/bathroom;Assistance with cooking/housework;Assist for transportation;Help with stairs or ramp for entrance;Two people to help with walking and/or transfers   Can travel by private vehicle     No  Equipment Recommendations  Other (comment) (TBD at next facility)    Recommendations for Other Services       Precautions / Restrictions Precautions Precautions: Fall Recall of Precautions/Restrictions: Impaired Restrictions Weight Bearing Restrictions Per Provider Order: No     Mobility  Bed Mobility Overal bed mobility: Needs Assistance Bed Mobility: Supine to Sit     Supine to sit: Mod assist     General bed mobility comments: assist for trunk;  vc's for technique    Transfers Overall transfer level: Needs assistance Equipment used: Rolling walker (2 wheels) Transfers: Sit to/from Stand Sit to Stand: Min assist, Mod assist, +2 physical assistance           General transfer comment: x1 trial from bed and x1 trial from recliner (standing); vc's for UE/LE placement; assist to initiate and come to full stand; assist to control descent sitting    Ambulation/Gait Ambulation/Gait assistance: Min assist, +2 physical assistance Gait Distance (Feet): 3 Feet (bed to recliner) Assistive device: Rolling walker (2 wheels)   Gait velocity: decreased     General Gait Details: L LE externally rotated; shuffling gait; assist to steady   Stairs             Wheelchair Mobility     Tilt Bed    Modified Rankin (Stroke Patients Only)       Balance Overall balance assessment: Needs assistance Sitting-balance support: No upper extremity supported, Feet supported Sitting balance-Leahy Scale: Fair Sitting balance - Comments: steady static sitting   Standing balance support: Bilateral upper extremity supported, Reliant on assistive device for balance Standing balance-Leahy Scale: Poor Standing balance comment: CGA for safety in standing (use of RW)                            Communication Communication Communication: No apparent difficulties  Cognition Arousal: Alert Behavior During Therapy: WFL for tasks assessed/performed   PT - Cognitive impairments: No apparent impairments  Following commands: Impaired Following commands impaired: Follows one step commands with increased time    Cueing Cueing Techniques: Verbal cues, Tactile cues, Visual cues  Exercises      General Comments  Nursing cleared pt for participation in physical therapy.  Pt agreeable to PT session.      Pertinent Vitals/Pain Pain Assessment Pain Assessment: Faces Faces Pain Scale: Hurts little  more (6-8/10 with activity/movement) Pain Location: L knee Pain Descriptors / Indicators: Discomfort Pain Intervention(s): Limited activity within patient's tolerance, Monitored during session, Repositioned    Home Living                          Prior Function            PT Goals (current goals can now be found in the care plan section) Acute Rehab PT Goals Patient Stated Goal: to improve strength and walking PT Goal Formulation: With patient Time For Goal Achievement: 10/01/23 Potential to Achieve Goals: Good Progress towards PT goals: Progressing toward goals    Frequency    Min 2X/week      PT Plan      Co-evaluation              AM-PAC PT "6 Clicks" Mobility   Outcome Measure  Help needed turning from your back to your side while in a flat bed without using bedrails?: A Little Help needed moving from lying on your back to sitting on the side of a flat bed without using bedrails?: A Lot Help needed moving to and from a bed to a chair (including a wheelchair)?: A Lot Help needed standing up from a chair using your arms (e.g., wheelchair or bedside chair)?: A Lot Help needed to walk in hospital room?: Total Help needed climbing 3-5 steps with a railing? : Total 6 Click Score: 11    End of Session Equipment Utilized During Treatment: Gait belt Activity Tolerance: Patient limited by fatigue Patient left: in chair;with call bell/phone within reach;with chair alarm set;with nursing/sitter in room;with family/visitor present Nurse Communication: Mobility status;Precautions;Other (comment) (Pt's pain status) PT Visit Diagnosis: Other abnormalities of gait and mobility (R26.89);Muscle weakness (generalized) (M62.81)     Time: 4098-1191 PT Time Calculation (min) (ACUTE ONLY): 18 min  Charges:    $Therapeutic Activity: 8-22 mins PT General Charges $$ ACUTE PT VISIT: 1 Visit                     Hendricks Limes, PT 09/19/23, 4:21 PM

## 2023-09-19 NOTE — Progress Notes (Signed)
 PROGRESS NOTE    Judy [name]  ZOX:[id number] DOB: April 29, 1926 DOA: 09/16/2023 PCP: Lyndon Code, MD   Assessment & Plan:   Principal Problem:   CHF (congestive heart failure) (HCC) Active Problems:   Severe anemia  Assessment and Plan: Acute on chronic diastolic CHF: continue on IV lasix. Monitor I/Os. Recent echo in 01/2023.   IDA: symptomatic. S/p 2 units of pRBCs transfused so far. Continue on PPI. Will transfuse if Hb < 7.0. Pt does not any any invasive procedures, so no EGD or colonoscopy. GI signed off   Acute on chronic left knee pain: received steroid injection approx 3 weeks ago and cannot have another steroid injection for 3 months as per ortho. Can f/u outpatient w/ Dr. Audelia Acton or Dr. Ernest Pine for gel shot (only can be done outpatient)   Thrombocytosis: etiology unclear. Will continue to monitor    Hx of RUQ mass: w/ unintentional weight loss. Etiology unclear. US shows 2.5 cm liver lesion, echogenic shadowing focus in area of gallbladder, possible large gallstone but no ductal dilatation. MRI liver ordered to further assess liver lesion but pt refused MRI and pt does not want to f/u on this  Severe protein calorie malnutrition: as per RD. Continue on nutritional supplements  Deconditioning: PT/OT recs SNF but pt refuses SNF but is agreeable to [facility] [place]   Failure to thrive: secondary to all above. Pt refuses palliative care & hospice. Palliative care recs apprec       DVT prophylaxis: SCDs Code Status: DNR Family Communication: discussed pt's care w/ pt's medical POA, Hector, and answered his questions Disposition Plan: likely d/c back home  Level of care: Telemetry Cardiac  Status is: Inpatient Remains inpatient appropriate because: severity of illness    Consultants:  GI   Procedures:   Antimicrobials:   Subjective: Pt c/o left knee pain   Objective: Vitals:   09/18/23 2316 09/19/23 0437 09/19/23 0500 09/19/23 0820  BP: 126/67 (!) 115/58  121/70   Pulse: 92 82  83  Resp: 16 15    Temp: 98 F (36.7 C) 97.9 F (36.6 C)  97.7 F (36.5 C)  TempSrc: Oral Oral    SpO2: 96% 95%  99%  Weight:   51.4 kg   Height:        Intake/Output Summary (Last 24 hours) at 09/19/2023 0848 Last data filed at 09/18/2023 1614 Gross per 24 hour  Intake 100 ml  Output 600 ml  Net -500 ml   Filed Weights   09/17/23 2021 09/18/23 0505 09/19/23 0500  Weight: 55.3 kg 51.4 kg 51.4 kg    Examination:  General exam: Appears calm & comfortable. Cachetic appearing   Respiratory system: decreased breath sounds b/l  Cardiovascular system: S1 & S2+. No rubs or gallops  Gastrointestinal system: abd is soft, NT, ND & hypoactive bowel sounds   Central nervous system: alert & oriented. Moves all extremities  Psychiatry: judgement and insight appears at baseline. Flat mood and affect     Data Reviewed: I have personally reviewed following labs and imaging studies  CBC: Recent Labs  Lab 09/16/23 1220 09/16/23 1253 09/16/23 2300 09/17/23 0532 09/18/23 0709 09/19/23 0651  WBC 9.9 11.0*  --  14.5* 11.7* 10.3  HGB 4.0* 3.9* 7.8* 7.4* 7.3* 7.5*  HCT 15.5* 14.7* 24.2* 23.0* 22.4* 24.5*  MCV 75.2* 72.1*  --  75.9* 74.9* 79.0*  PLT 700* 690*  --  489* 454* 445*   Basic Metabolic Panel: Recent Labs  Lab 09/16/23 1220  09/17/23 0532 09/18/23 0709 09/19/23 0651  NA 138 137 134* 134*  K 4.1 3.5 3.1* 3.8  CL 111 109 102 102  CO2 15* 22 25 25   GLUCOSE 162* 75 102* 69*  BUN 15 16 17 14   CREATININE 0.99 0.94 0.76 0.71  CALCIUM 7.7* 7.6* 7.4* 7.5*  MG  --   --  1.8  --    GFR: Estimated Creatinine Clearance: 33.3 mL/min (by C-G formula based on SCr of 0.71 mg/dL). Liver Function Tests: No results for input(s): "AST", "ALT", "ALKPHOS", "BILITOT", "PROT", "ALBUMIN" in the last 168 hours. No results for input(s): "LIPASE", "AMYLASE" in the last 168 hours. No results for input(s): "AMMONIA" in the last 168 hours. Coagulation Profile: No results for  input(s): "INR", "PROTIME" in the last 168 hours. Cardiac Enzymes: No results for input(s): "CKTOTAL", "CKMB", "CKMBINDEX", "TROPONINI" in the last 168 hours. BNP (last 3 results) No results for input(s): "PROBNP" in the last 8760 hours. HbA1C: No results for input(s): "HGBA1C" in the last 72 hours. CBG: No results for input(s): "GLUCAP" in the last 168 hours. Lipid Profile: No results for input(s): "CHOL", "HDL", "LDLCALC", "TRIG", "CHOLHDL", "LDLDIRECT" in the last 72 hours. Thyroid Function Tests: No results for input(s): "TSH", "T4TOTAL", "FREET4", "T3FREE", "THYROIDAB" in the last 72 hours. Anemia Panel: Recent Labs    09/16/23 1220 09/17/23 1114  VITAMINB12  --  607  FOLATE  --  5.8*  FERRITIN 22  --   TIBC 284  --   IRON 10*  --   RETICCTPCT 2.5  --    Sepsis Labs: No results for input(s): "PROCALCITON", "LATICACIDVEN" in the last 168 hours.  No results found for this or any previous visit (from the past 240 hours).       Radiology Studies: No results found.       Scheduled Meds:  feeding supplement  237 mL Oral TID BM   ferrous gluconate  324 mg Oral BID WC   furosemide  20 mg Intravenous Daily   loratadine  10 mg Oral Daily   magnesium oxide  400 mg Oral BID   multivitamin with minerals  1 tablet Oral Daily   oxybutynin  5 mg Oral BID   pantoprazole  40 mg Oral BID   polyethylene glycol  17 g Oral QODAY   senna  1 tablet Oral BID   Continuous Infusions:   LOS: 3 days      Charise Killian, MD [facility] Pager [phone]  If 7PM-7AM, please contact night-coverage [email or web address] 09/19/2023, 8:48 AM

## 2023-09-20 DIAGNOSIS — Z7189 Other specified counseling: Secondary | ICD-10-CM | POA: Diagnosis not present

## 2023-09-20 DIAGNOSIS — I5033 Acute on chronic diastolic (congestive) heart failure: Secondary | ICD-10-CM | POA: Diagnosis not present

## 2023-09-20 LAB — CBC
HCT: 23 % — ABNORMAL LOW (ref 36.0–46.0)
Hemoglobin: 7.2 g/dL — ABNORMAL LOW (ref 12.0–15.0)
MCH: 25.3 pg — ABNORMAL LOW (ref 26.0–34.0)
MCHC: 31.3 g/dL (ref 30.0–36.0)
MCV: 80.7 fL (ref 80.0–100.0)
Platelets: 426 10*3/uL — ABNORMAL HIGH (ref 150–400)
RBC: 2.85 MIL/uL — ABNORMAL LOW (ref 3.87–5.11)
RDW: 23.8 % — ABNORMAL HIGH (ref 11.5–15.5)
WBC: 10 10*3/uL (ref 4.0–10.5)
nRBC: 0.4 % — ABNORMAL HIGH (ref 0.0–0.2)

## 2023-09-20 LAB — BASIC METABOLIC PANEL WITH GFR
Anion gap: 7 (ref 5–15)
BUN: 18 mg/dL (ref 8–23)
CO2: 25 mmol/L (ref 22–32)
Calcium: 7.4 mg/dL — ABNORMAL LOW (ref 8.9–10.3)
Chloride: 100 mmol/L (ref 98–111)
Creatinine, Ser: 0.77 mg/dL (ref 0.44–1.00)
GFR, Estimated: >60 mL/min (ref >60)
Glucose, Bld: 80 mg/dL (ref 70–99)
Potassium: 3.8 mmol/L (ref 3.5–5.1)
Sodium: 132 mmol/L — ABNORMAL LOW (ref 135–145)

## 2023-09-20 LAB — BPAM RBC
Blood Product Expiration Date: 202504262359
Blood Product Expiration Date: 202504292359
Blood Product Expiration Date: 202504292359
ISSUE DATE / TIME: 202504041354
ISSUE DATE / TIME: 202504041844
Unit Type and Rh: 5100
Unit Type and Rh: 5100
Unit Type and Rh: 5100

## 2023-09-20 LAB — TYPE AND SCREEN
ABO/RH(D): O POS
Antibody Screen: NEGATIVE
Unit division: 0
Unit division: 0
Unit division: 0

## 2023-09-20 LAB — PREPARE RBC (CROSSMATCH)

## 2023-09-20 MED ORDER — FERROUS GLUCONATE 324 (38 FE) MG PO TABS: 324.0000 mg | ORAL_TABLET | Freq: Two times a day (BID) | ORAL | 0 refills | Status: AC

## 2023-09-20 MED ORDER — PANTOPRAZOLE SODIUM 40 MG PO TBEC: 40.0000 mg | DELAYED_RELEASE_TABLET | Freq: Two times a day (BID) | ORAL | 0 refills | Status: AC

## 2023-09-20 NOTE — Plan of Care (Signed)
   Problem: Health Behavior/Discharge Planning: Goal: Ability to manage health-related needs will improve Outcome: Progressing   Problem: Education: Goal: Knowledge of General Education information will improve Description: Including pain rating scale, medication(s)/side effects and non-pharmacologic comfort measures Outcome: Progressing

## 2023-09-20 NOTE — Progress Notes (Signed)
 Daily Progress Note   Patient Name: Judy [name]       Date: 09/20/2023 DOB: 04-20-1926  Age: 89 y.o. MRN#: [id number] Attending Physician: Charise Killian, MD Primary Care Physician: Lyndon Code, MD Admit Date: 09/16/2023  Reason for Consultation/Follow-up: Establishing goals of care  Subjective: Notes and labs reviewed.  And to see patient.  She is currently sitting in bed with her private duty caregiver at bedside.  She is feeling better today and wants to go home.  She understands she will need to follow-up with orthopedics outpatient for knee injection.  Attending and bedside to speak with patient, PMT departed room.   Length of Stay: 4  Current Medications: Scheduled Meds:   feeding supplement  237 mL Oral TID BM   ferrous gluconate  324 mg Oral BID WC   furosemide  20 mg Intravenous Daily   loratadine  10 mg Oral Daily   magnesium oxide  400 mg Oral BID   multivitamin with minerals  1 tablet Oral Daily   oxybutynin  5 mg Oral BID   pantoprazole  40 mg Oral BID   polyethylene glycol  17 g Oral QODAY   senna  1 tablet Oral BID    Continuous Infusions:   PRN Meds: acetaminophen **OR** acetaminophen, bisacodyl, HYDROmorphone (DILAUDID) injection, ondansetron **OR** ondansetron (ZOFRAN) IV, mouth rinse, traMADol  Physical Exam Pulmonary:     Effort: Pulmonary effort is normal.  Neurological:     Mental Status: She is alert.             Vital Signs: BP 110/60 (BP Location: Left Arm)   Pulse 82   Temp 98.1 F (36.7 C) (Oral)   Resp 17   Ht 5\' 2"  (1.575 m)   Wt 48.8 kg   SpO2 96%   BMI 19.68 kg/m  SpO2: SpO2: 96 % O2 Device: O2 Device: Room Air O2 Flow Rate:    Intake/output summary:  Intake/Output Summary (Last 24 hours) at 09/20/2023 1216 Last data  filed at 09/20/2023 0900 Gross per 24 hour  Intake 490 ml  Output 600 ml  Net -110 ml   LBM: Last BM Date : 09/16/23 Baseline Weight: Weight: 55.3 kg Most recent weight: Weight: 48.8 kg    Patient Active Problem List   Diagnosis Date Noted  Protein-calorie malnutrition, severe 09/19/2023   Severe anemia 09/17/2023   Vomiting 09/16/2023   CHF (congestive heart failure) (HCC) 09/16/2023   Edema of left ankle 05/16/2023   Urinary frequency 04/13/2023   Malnutrition of moderate degree 01/20/2023   Acute respiratory failure with hypoxia (HCC) 01/16/2023   Thrombocytosis 01/16/2023   Abnormal gall bladder diagnostic imaging 01/11/2023   Lactic acidosis 01/11/2023   Leukocytosis (leucocytosis) 01/11/2023   Constipation by delayed colonic transit 01/11/2023   Sepsis (HCC) 01/10/2023   Gastroesophageal reflux disease without esophagitis 01/06/2023   Presbycusis of both ears 08/02/2022   Arthritis 10/14/2021   Heart murmur 10/14/2021   Hyperlipidemia 10/14/2021   Shingles 10/14/2021   Subdural hematoma (HCC) 11/12/2019   Hypertension 08/09/2019   Stage 3a chronic kidney disease (HCC) 08/09/2019   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    Palliative Care Assessment & Plan     Recommendations/Plan: PMT will sign off as goals are set.   Code Status:    Code Status Orders  (From admission, onward)           Start     Ordered   09/16/23 1440  Do not attempt resuscitation (DNR)- Limited -Do Not Intubate (DNI)  Continuous       Question Answer Comment  If pulseless and not breathing No CPR or chest compressions.   In Pre-Arrest Conditions (Patient Is Breathing and Has A Pulse) Do not intubate. Provide all appropriate non-invasive medical interventions. Avoid ICU transfer unless indicated or required.   Consent: Discussion documented in EHR or advanced directives reviewed      09/16/23 1439           Code Status History     Date Active Date  Inactive Code Status Order ID Comments User Context   01/25/2023 2044 09/16/2023 1208 DNR [id number]  Earnestine Mealing, MD Outpatient   01/16/2023 1515 01/25/2023 1713 DNR [id number]  Floydene Flock, MD ED   01/10/2023 1701 01/11/2023 2044 DNR [id number]  Marcelino Duster, MD ED   01/10/2023 1636 01/10/2023 1700 Full Code [id number]  Marcelino Duster, MD ED      Advance Directive Documentation    Flowsheet Row Most Recent Value  Type of Advance Directive Living will  Pre-existing out of facility DNR order (yellow form or pink MOST form) --  "MOST" Form in Place? --       Care plan was discussed with attending  Thank you for allowing the Palliative Medicine Team to assist in the care of this patient.    Morton Stall, NP  Please contact Palliative Medicine Team phone at [phone] for questions and concerns.

## 2023-09-20 NOTE — Plan of Care (Signed)
 Plan of care is reviewed. Pt has been progressing. She is stable hemodynamically, afebrile, normal respiratory effort, no acute distress note. No major complaints overnight. She is able to rest well. We will continue to monitor.  Problem: Health Behavior/Discharge Planning: Goal: Ability to manage health-related needs will improve Outcome: Progressing   Problem: Clinical Measurements: Goal: Ability to maintain clinical measurements within normal limits will improve Outcome: Progressing Goal: Will remain free from infection Outcome: Progressing Goal: Diagnostic test results will improve Outcome: Progressing Goal: Respiratory complications will improve Outcome: Progressing Goal: Cardiovascular complication will be avoided Outcome: Progressing   Problem: Activity: Goal: Risk for activity intolerance will decrease Outcome: Progressing   Problem: Nutrition: Goal: Adequate nutrition will be maintained Outcome: Progressing   Problem: Elimination: Goal: Will not experience complications related to bowel motility Outcome: Progressing Goal: Will not experience complications related to urinary retention Outcome: Progressing   Problem: Safety: Goal: Ability to remain free from injury will improve Outcome: Progressing   Problem: Skin Integrity: Goal: Risk for impaired skin integrity will decrease Outcome: Progressing   Problem: Pain Managment: Goal: General experience of comfort will improve and/or be controlled Outcome: Progressing   Filiberto Pinks, RN

## 2023-09-20 NOTE — Discharge Summary (Signed)
 Physician Discharge Summary  Judy [name] EAV:[id number] DOB: 12/14/1925 DOA: 09/16/2023  PCP: Lyndon Code, MD  Admit date: 09/16/2023 Discharge date: 09/20/2023  Admitted From: home  Disposition:  home w/ home health   Recommendations for Outpatient Follow-up:  Follow up with PCP in 1-2 weeks F/u w/ ortho surg, Dr. Ernest Pine or Dr. Audelia Acton within 1 week for gel shot of left knee  Home Health: yes Equipment/Devices:  Discharge Condition: stable  CODE STATUS: DNR Diet recommendation: Dysphagia II diet  Brief/Interim Summary: HPI was taken from Dr. Chipper Herb: Judy [name] is a 89 y.o. female with medical history significant of HTN, HLD, presented with worsening of generalized weakness exertional dyspnea.   Neighbor visited patient this morning and patient complained about feeling worsening of generalized weakness and exertional dyspnea and difficulty walking.  Denies any chest pain, denied any orthopnea no fever or chills.  No diarrhea denied any nauseous vomiting no bloody bowel movement or black tarry stool.  Patient did report that she lost about 40 pounds since November last year.  Mostly she attributed that to poor appetite.   ED Course: Afebrile, blood pressure 120/80 "100% on room air.  Blood work showed hemoglobin 4.0, repeat hemoglobin 3.9, BUN 15 creatinine 0.9 bicarb 15K 4.1.   PRBC x 2 ordered in the ED.  Discharge Diagnoses:  Principal Problem:   CHF (congestive heart failure) (HCC) Active Problems:   Severe anemia   Protein-calorie malnutrition, severe  Acute on chronic diastolic CHF: continue on lasix. Monitor I/Os. Recent echo in 01/2023.    IDA: symptomatic. S/p 2 units of pRBCs transfused so far. Continue on PPI. Will transfuse if Hb < 7.0. Pt does not any any invasive procedures, so no EGD or colonoscopy. GI signed off    Acute on chronic left knee pain: received steroid injection approx 3 weeks ago and cannot have another steroid injection for 3 months as  per ortho. Can f/u outpatient w/ Dr. Audelia Acton or Dr. Ernest Pine for gel shot (only can be done outpatient)    Thrombocytosis: etiology unclear. Will continue to monitor    Hx of RUQ mass: w/ unintentional weight loss. Etiology unclear. US shows 2.5 cm liver lesion, echogenic shadowing focus in area of gallbladder, possible large gallstone but no ductal dilatation. MRI liver ordered to further assess liver lesion but pt refused MRI and pt does not want to f/u on this   Severe protein calorie malnutrition: as per RD. Continue on nutritional supplements   Deconditioning: PT/OT recs SNF but pt refuses SNF but is agreeable to [facility]    Failure to thrive: secondary to all above. Pt refuses palliative care & hospice. Palliative care recs apprec   Discharge Instructions  Discharge Instructions     Diet general   Complete by: As directed    Dysphagia II diet   Discharge instructions   Complete by: As directed    F/u w/ PCP in 1-2 weeks. F/u w/ ortho surg, Dr. Ernest Pine or Dr. Audelia Acton, for a gel shot for your left knee within 1 week.   Increase activity slowly   Complete by: As directed       Allergies as of 09/20/2023   No Known Allergies      Medication List     STOP taking these medications    aspirin 81 MG tablet       TAKE these medications    acetaminophen 500 MG tablet Commonly known as: TYLENOL Take 2 tablets (1,000 mg total) by mouth  every 8 (eight) hours as needed.   bisacodyl 5 MG EC tablet Commonly known as: bisacodyl Take 1 tablet (5 mg total) by mouth daily as needed for moderate constipation.   cholecalciferol 25 MCG (1000 UNIT) tablet Commonly known as: VITAMIN D3 Take 1 tablet (25 mcg total) by mouth daily.   diclofenac Sodium 1 % Gel Commonly known as: VOLTAREN Apply 2 g topically every 6 (six) hours as needed.   feeding supplement Liqd Take 237 mLs by mouth 3 (three) times daily between meals.   ferrous gluconate 324 MG tablet Commonly known as: FERGON Take  1 tablet (324 mg total) by mouth 2 (two) times daily with a meal.   furosemide 20 MG tablet Commonly known as: LASIX Take 1 tablet (20 mg total) by mouth daily as needed. Every 24 hours as needed   loratadine 10 MG tablet Commonly known as: CLARITIN Take 1 tablet (10 mg total) by mouth daily.   magnesium oxide 400 (240 Mg) MG tablet Commonly known as: MAG-OX Take 1 tablet (400 mg total) by mouth 2 (two) times daily.   multivitamin with minerals Tabs tablet Take 1 tablet by mouth daily.   ondansetron 4 MG tablet Commonly known as: ZOFRAN Take 1 tablet (4 mg total) by mouth every 8 (eight) hours as needed for nausea or vomiting.   oxybutynin 5 MG tablet Commonly known as: DITROPAN Take 5 mg by mouth 2 (two) times daily.   pantoprazole 40 MG tablet Commonly known as: Protonix Take 1 tablet (40 mg total) by mouth 2 (two) times daily. What changed: when to take this   polyethylene glycol 17 g packet Commonly known as: MIRALAX / GLYCOLAX Take 17 g by mouth every other day.   senna 8.6 MG Tabs tablet Commonly known as: SENOKOT Take 1 tablet (8.6 mg total) by mouth 2 (two) times daily.   traMADol 50 MG tablet Commonly known as: ULTRAM Take 0.5 tablets (25 mg total) by mouth every 8 (eight) hours as needed.   vitamin E 180 MG (400 UNITS) capsule Take 400 Units by mouth daily.        No Known Allergies  Consultations: Palliative care GI    Procedures/Studies: US Abdomen Limited RUQ (LIVER/GB) Result Date: 09/16/2023 CLINICAL DATA:  Gallbladder lesion. EXAM: ULTRASOUND ABDOMEN LIMITED RIGHT UPPER QUADRANT COMPARISON:  Ultrasound 01/10/2023.  CT 01/16/2023 and older FINDINGS: Gallbladder: Shadowing echogenic areas in the area of the gallbladder as on prior ultrasound. Based on prior CT this could be a large stone. No adjacent fluid. Gallbladder wall is measured at 3 mm, borderline. Common bile duct: Diameter: 4 mm Liver: Hypoechoic area identified in the right hepatic  lobe measuring 2.5 x 2.5 x 2.1 cm. Uncertain etiology. Portal vein is patent on color Doppler imaging with normal direction of blood flow towards the liver. Other: Right-sided pleural effusion IMPRESSION: Echogenic shadowing focus identified in the area of the gallbladder. Based on prior CT imaging this could be a large gallstone. Borderline gallbladder wall of 3 mm. No ductal dilatation. Hypoechoic liver lesion seen measuring 2.5 cm. Not clearly seen previously. Overall would recommend further evaluation of liver lesion with pre and postcontrast MRI or dynamic contrast CT when clinically appropriate to confirm etiology Electronically Signed   By: Karen Kays M.D.   On: 09/16/2023 16:46   DG Chest 1 View Result Date: 09/16/2023 CLINICAL DATA:  97293 CHF (congestive heart failure) (HCC) [address]293 EXAM: CHEST  1 VIEW COMPARISON:  01/19/2023. FINDINGS: There is subtle blunting of  bilateral lateral costophrenic angles, suggesting trace pleural effusions. There is interval decrease when compared to the prior exam from 01/19/2023. Bilateral lung fields are otherwise clear. No acute consolidation, lung collapse or pulmonary edema. No pneumothorax. Stable cardio-mediastinal silhouette. No acute osseous abnormalities. Redemonstration of marked degenerative changes of bilateral shoulder joints. Unfused left lateral clavicular fracture noted. The soft tissues are within normal limits. IMPRESSION: Trace bilateral pleural effusions, decreased since the prior study. Electronically Signed   By: Jules Schick M.D.   On: 09/16/2023 16:37   US Venous Img Lower Unilateral Left Result Date: 09/16/2023 CLINICAL DATA:  Left lower extremity edema for several days EXAM: Left LOWER EXTREMITY VENOUS DOPPLER ULTRASOUND TECHNIQUE: Gray-scale sonography with compression, as well as color and duplex ultrasound, were performed to evaluate the deep venous system(s) from the level of the common femoral vein through the popliteal and proximal calf  veins. COMPARISON:  None Available. FINDINGS: VENOUS Normal compressibility of the common femoral, superficial femoral, and popliteal veins, as well as the visualized calf veins. Visualized portions of profunda femoral vein and great saphenous vein unremarkable. No filling defects to suggest DVT on grayscale or color Doppler imaging. Doppler waveforms show normal direction of venous flow, normal respiratory plasticity and response to augmentation. Limited views of the contralateral common femoral vein are unremarkable. OTHER Complex cystic area in the popliteal fossa with some internal echoes but no blood flow on Doppler measures 2.4 x 4.2 x 1.3 cm. Limitations: none IMPRESSION: No evidence of left lower extremity DVT. Complex left-sided popliteal fossa cystic lesion. This very well could be a Baker's cyst. Please correlate with history and confirmatory evaluation as clinically appropriate Electronically Signed   By: Karen Kays M.D.   On: 09/16/2023 14:51   (Echo, Carotid, EGD, Colonoscopy, ERCP)    Subjective: Pt c/o knee pain   Discharge Exam: Vitals:   09/20/23 0526 09/20/23 0757  BP: (!) 125/54 110/60  Pulse: 80 82  Resp: 17 17  Temp:  98.1 F (36.7 C)  SpO2: 96% 96%   Vitals:   09/19/23 2355 09/20/23 0516 09/20/23 0526 09/20/23 0757  BP: (!) 110/54  (!) 125/54 110/60  Pulse: 73  80 82  Resp: 18  17 17   Temp: 98.1 F (36.7 C) 97.6 F (36.4 C)  98.1 F (36.7 C)  TempSrc: Oral Oral  Oral  SpO2: 96%  96% 96%  Weight:  48.8 kg    Height:        General: Pt is alert, awake, not in acute distress. Frail appearing  Cardiovascular: S1/S2 +, no rubs, no gallops Respiratory: CTA bilaterally, no wheezing, no rhonchi Abdominal: Soft, NT, ND, bowel sounds + Extremities: no edema, no cyanosis    The results of significant diagnostics from this hospitalization (including imaging, microbiology, ancillary and laboratory) are listed below for reference.     Microbiology: No results  found for this or any previous visit (from the past 240 hours).   Labs: BNP (last 3 results) Recent Labs    01/16/23 1125  BNP 57.0   Basic Metabolic Panel: Recent Labs  Lab 09/16/23 1220 09/17/23 0532 09/18/23 0709 09/19/23 0651 09/20/23 0604  NA 138 137 134* 134* 132*  K 4.1 3.5 3.1* 3.8 3.8  CL 111 109 102 102 100  CO2 15* 22 25 25 25   GLUCOSE 162* 75 102* 69* 80  BUN 15 16 17 14 18   CREATININE 0.99 0.94 0.76 0.71 0.77  CALCIUM 7.7* 7.6* 7.4* 7.5* 7.4*  MG  --   --  1.8  --   --    Liver Function Tests: No results for input(s): "AST", "ALT", "ALKPHOS", "BILITOT", "PROT", "ALBUMIN" in the last 168 hours. No results for input(s): "LIPASE", "AMYLASE" in the last 168 hours. No results for input(s): "AMMONIA" in the last 168 hours. CBC: Recent Labs  Lab 09/16/23 1253 09/16/23 2300 09/17/23 0532 09/18/23 0709 09/19/23 0651 09/20/23 0604  WBC 11.0*  --  14.5* 11.7* 10.3 10.0  HGB 3.9* 7.8* 7.4* 7.3* 7.5* 7.2*  HCT 14.7* 24.2* 23.0* 22.4* 24.5* 23.0*  MCV 72.1*  --  75.9* 74.9* 79.0* 80.7  PLT 690*  --  489* 454* 445* 426*   Cardiac Enzymes: No results for input(s): "CKTOTAL", "CKMB", "CKMBINDEX", "TROPONINI" in the last 168 hours. BNP: Invalid input(s): "POCBNP" CBG: No results for input(s): "GLUCAP" in the last 168 hours. D-Dimer No results for input(s): "DDIMER" in the last 72 hours. Hgb A1c No results for input(s): "HGBA1C" in the last 72 hours. Lipid Profile No results for input(s): "CHOL", "HDL", "LDLCALC", "TRIG", "CHOLHDL", "LDLDIRECT" in the last 72 hours. Thyroid function studies No results for input(s): "TSH", "T4TOTAL", "T3FREE", "THYROIDAB" in the last 72 hours.  Invalid input(s): "FREET3" Anemia work up No results for input(s): "VITAMINB12", "FOLATE", "FERRITIN", "TIBC", "IRON", "RETICCTPCT" in the last 72 hours.  Urinalysis    Component Value Date/Time   COLORURINE STRAW (A) 01/22/2023 0825   APPEARANCEUR CLEAR (A) 01/22/2023 0825    LABSPEC 1.006 01/22/2023 0825   PHURINE 7.0 01/22/2023 0825   GLUCOSEU NEGATIVE 01/22/2023 0825   HGBUR MODERATE (A) 01/22/2023 0825   BILIRUBINUR neg 04/13/2023 1029   KETONESUR NEGATIVE 01/22/2023 0825   PROTEINUR Positive (A) 04/13/2023 1029   PROTEINUR NEGATIVE 01/22/2023 0825   UROBILINOGEN 0.2 04/13/2023 1029   NITRITE neg 04/13/2023 1029   NITRITE NEGATIVE 01/22/2023 0825   LEUKOCYTESUR Negative 04/13/2023 1029   LEUKOCYTESUR NEGATIVE 01/22/2023 0825   Sepsis Labs Recent Labs  Lab 09/17/23 0532 09/18/23 0709 09/19/23 0651 09/20/23 0604  WBC 14.5* 11.7* 10.3 10.0   Microbiology No results found for this or any previous visit (from the past 240 hours).   Time coordinating discharge: Over 30 minutes  SIGNED:   Charise Killian, MD  [facility] 09/20/2023, 12:40 PM Pager   If 7PM-7AM, please contact night-coverage [email or web address]

## 2023-09-21 ENCOUNTER — Telehealth: Payer: Self-pay

## 2023-09-21 NOTE — Transitions of Care (Post Inpatient/ED Visit) (Signed)
   09/21/2023  Name: Judy [name] MRN: [id number] DOB: 03/08/1926  Today's TOC FU Call Status: Today's TOC FU Call Status:: Successful TOC FU Call Completed (Patient states she feels she has everything she needs and knows her medications, states she has someone coming this afternoon and denied need for TOC calls) TOC FU Call Complete Date: 09/21/23 - Patient declined participation.   Attempted to reach the patient regarding the most recent Inpatient/ED visit.Briefly spoke with patient who denied need for TOC calls.   Follow Up Plan: No further outreach attempts will be made at this time. Patient denied need for [facility] calls  Hilbert Odor RN, CCM [place]  [facility] RN Care Manager [phone]

## 2023-09-26 ENCOUNTER — Ambulatory Visit: Payer: Medicare Other | Admitting: Podiatry

## 2023-09-29 ENCOUNTER — Telehealth: Payer: Self-pay

## 2023-09-29 NOTE — Telephone Encounter (Signed)
 Judy [name] with [facility] called stating that they need verbal order to D/C Judy [name] Nurse from coming into her home, she states that patient does not want people in and out of her home

## 2023-10-03 NOTE — Telephone Encounter (Signed)
 Judy [name] informed and said that pt is still getting the PT so she will still have eyes In the home  and if she changes her mind for [facility] then they will call for orders

## 2023-10-06 ENCOUNTER — Telehealth: Payer: Self-pay

## 2023-10-06 NOTE — Telephone Encounter (Signed)
 Judy [name] with [facility] called asking for verbal order to D/C  Nursing as the patient doesn't  not want people in her home. Amber had already given a verbal okay but they called back and said that its under your name and you have to give the verbal okay please advise

## 2023-10-06 NOTE — Telephone Encounter (Signed)
Tosha informed.

## 2023-10-11 ENCOUNTER — Other Ambulatory Visit: Payer: Self-pay

## 2023-10-11 MED ORDER — LORATADINE 10 MG PO TABS: 10.0000 mg | ORAL_TABLET | Freq: Every day | ORAL | Status: AC

## 2023-10-27 ENCOUNTER — Telehealth: Payer: Self-pay | Admitting: Cardiology

## 2023-10-27 NOTE — Telephone Encounter (Signed)
 Bonnie with [facility] left VM that she has concerns about the care of this patient and would like a call back.  [phone]

## 2023-11-10 ENCOUNTER — Telehealth: Payer: Self-pay

## 2023-11-10 NOTE — Telephone Encounter (Signed)
 Home health aide called asking for  pain patches to be called in for the patient for her knee, per  the aide the patient states that although she gets injections its not helping with the pain in her knee when she ambulates, they were also asking about a rollator rx, I will check the chart for that order or fax, she states it was sent over on 14th or 15th

## 2023-11-14 ENCOUNTER — Other Ambulatory Visit: Payer: Self-pay | Admitting: Cardiology

## 2023-11-14 ENCOUNTER — Ambulatory Visit: Payer: Medicare Other | Admitting: Cardiology

## 2023-11-14 MED ORDER — LIDOCAINE 5 % EX PTCH
1.0000 | MEDICATED_PATCH | CUTANEOUS | 0 refills | Status: AC
Start: 2023-11-14 — End: 2023-11-24

## 2023-11-14 NOTE — Telephone Encounter (Signed)
 Refaxed the order that was already sent 5/15

## 2023-11-16 ENCOUNTER — Telehealth: Payer: Self-pay

## 2023-11-16 NOTE — Telephone Encounter (Signed)
 Adeline Hone called regarding Judy [name]. She has a bed sore that is red, but not seem to be infected. Requested any suggestions you may have for her regarding this matter.

## 2023-11-18 ENCOUNTER — Telehealth: Payer: Self-pay

## 2023-11-18 NOTE — Telephone Encounter (Signed)
 I spoke with Lesta Rater,  she is okay with getting wound care out there to the patient she states that the size of the wound is about the size of a quarter OR 50 cent piece. She is worried that the wound will get bigger and get infected, see other task but I'm unable to comment in it since its someone elses box. Please advise what youd like to do. I think she would benefit from palliative or hospice care, patient is getting mean and forgetful per the caregiver

## 2023-11-21 ENCOUNTER — Other Ambulatory Visit: Payer: Self-pay | Admitting: Internal Medicine

## 2023-11-21 MED ORDER — DUODERM CGF DRESSING EX MISC: 1.0000 | CUTANEOUS | 3 refills | Status: AC

## 2023-12-12 ENCOUNTER — Inpatient Hospital Stay
Admission: EM | Admit: 2023-12-12 | Discharge: 2023-12-15 | DRG: 377 | Disposition: A | Discharge status: Hospice | Attending: Internal Medicine | Admitting: Internal Medicine

## 2023-12-12 ENCOUNTER — Encounter: Payer: Self-pay | Admitting: Intensive Care

## 2023-12-12 ENCOUNTER — Emergency Department

## 2023-12-12 ENCOUNTER — Other Ambulatory Visit: Payer: Self-pay

## 2023-12-12 DIAGNOSIS — Z96641 Presence of right artificial hip joint: Secondary | ICD-10-CM | POA: Diagnosis present

## 2023-12-12 DIAGNOSIS — K922 Gastrointestinal hemorrhage, unspecified: Secondary | ICD-10-CM | POA: Diagnosis present

## 2023-12-12 DIAGNOSIS — L8990 Pressure ulcer of unspecified site, unspecified stage: Secondary | ICD-10-CM | POA: Insufficient documentation

## 2023-12-12 DIAGNOSIS — R54 Age-related physical debility: Secondary | ICD-10-CM | POA: Diagnosis present

## 2023-12-12 DIAGNOSIS — Z66 Do not resuscitate: Secondary | ICD-10-CM | POA: Diagnosis present

## 2023-12-12 DIAGNOSIS — R195 Other fecal abnormalities: Principal | ICD-10-CM

## 2023-12-12 DIAGNOSIS — I110 Hypertensive heart disease with heart failure: Secondary | ICD-10-CM | POA: Diagnosis present

## 2023-12-12 DIAGNOSIS — N3289 Other specified disorders of bladder: Secondary | ICD-10-CM | POA: Diagnosis present

## 2023-12-12 DIAGNOSIS — D62 Acute posthemorrhagic anemia: Secondary | ICD-10-CM | POA: Diagnosis present

## 2023-12-12 DIAGNOSIS — K5731 Diverticulosis of large intestine without perforation or abscess with bleeding: Secondary | ICD-10-CM | POA: Diagnosis present

## 2023-12-12 DIAGNOSIS — E43 Unspecified severe protein-calorie malnutrition: Secondary | ICD-10-CM | POA: Diagnosis present

## 2023-12-12 DIAGNOSIS — Z6820 Body mass index (BMI) 20.0-20.9, adult: Secondary | ICD-10-CM

## 2023-12-12 DIAGNOSIS — Z515 Encounter for palliative care: Secondary | ICD-10-CM

## 2023-12-12 DIAGNOSIS — R627 Adult failure to thrive: Secondary | ICD-10-CM | POA: Diagnosis present

## 2023-12-12 DIAGNOSIS — E7800 Pure hypercholesterolemia, unspecified: Secondary | ICD-10-CM | POA: Diagnosis present

## 2023-12-12 DIAGNOSIS — N3080 Other cystitis without hematuria: Secondary | ICD-10-CM | POA: Diagnosis present

## 2023-12-12 DIAGNOSIS — Z7189 Other specified counseling: Secondary | ICD-10-CM | POA: Diagnosis not present

## 2023-12-12 DIAGNOSIS — I5032 Chronic diastolic (congestive) heart failure: Secondary | ICD-10-CM | POA: Diagnosis present

## 2023-12-12 DIAGNOSIS — Z79899 Other long term (current) drug therapy: Secondary | ICD-10-CM

## 2023-12-12 LAB — COMPREHENSIVE METABOLIC PANEL WITH GFR
ALT: 11 U/L (ref 0–44)
AST: 18 U/L (ref 15–41)
Albumin: 3.3 g/dL — ABNORMAL LOW (ref 3.5–5.0)
Alkaline Phosphatase: 56 U/L (ref 38–126)
Anion gap: 13 (ref 5–15)
BUN: 37 mg/dL — ABNORMAL HIGH (ref 8–23)
CO2: 18 mmol/L — ABNORMAL LOW (ref 22–32)
Calcium: 8.6 mg/dL — ABNORMAL LOW (ref 8.9–10.3)
Chloride: 111 mmol/L (ref 98–111)
Creatinine, Ser: 0.77 mg/dL (ref 0.44–1.00)
GFR, Estimated: >60 mL/min (ref >60)
Glucose, Bld: 159 mg/dL — ABNORMAL HIGH (ref 70–99)
Potassium: 4 mmol/L (ref 3.5–5.1)
Sodium: 142 mmol/L (ref 135–145)
Total Bilirubin: 0.8 mg/dL (ref 0.0–1.2)
Total Protein: 6.1 g/dL — ABNORMAL LOW (ref 6.5–8.1)

## 2023-12-12 LAB — CBC WITH DIFFERENTIAL/PLATELET
Abs Immature Granulocytes: 0.19 10*3/uL — ABNORMAL HIGH (ref 0.00–0.07)
Basophils Absolute: 0.1 10*3/uL (ref 0.0–0.1)
Basophils Relative: 0 %
Eosinophils Absolute: 0.1 10*3/uL (ref 0.0–0.5)
Eosinophils Relative: 0 %
HCT: 28.2 % — ABNORMAL LOW (ref 36.0–46.0)
Hemoglobin: 8.9 g/dL — ABNORMAL LOW (ref 12.0–15.0)
Immature Granulocytes: 1 %
Lymphocytes Relative: 8 %
Lymphs Abs: 1.4 10*3/uL (ref 0.7–4.0)
MCH: 31.9 pg (ref 26.0–34.0)
MCHC: 31.6 g/dL (ref 30.0–36.0)
MCV: 101.1 fL — ABNORMAL HIGH (ref 80.0–100.0)
Monocytes Absolute: 1.4 10*3/uL — ABNORMAL HIGH (ref 0.1–1.0)
Monocytes Relative: 8 %
Neutro Abs: 13.6 10*3/uL — ABNORMAL HIGH (ref 1.7–7.7)
Neutrophils Relative %: 83 %
Platelets: 512 10*3/uL — ABNORMAL HIGH (ref 150–400)
RBC: 2.79 MIL/uL — ABNORMAL LOW (ref 3.87–5.11)
RDW: 17.3 % — ABNORMAL HIGH (ref 11.5–15.5)
WBC: 16.7 10*3/uL — ABNORMAL HIGH (ref 4.0–10.5)
nRBC: 0.2 % (ref 0.0–0.2)

## 2023-12-12 LAB — APTT: aPTT: 23 s — ABNORMAL LOW (ref 24–36)

## 2023-12-12 LAB — PROTIME-INR
INR: 1 (ref 0.8–1.2)
Prothrombin Time: 13.5 s (ref 11.4–15.2)

## 2023-12-12 LAB — HEMOGLOBIN AND HEMATOCRIT, BLOOD
HCT: 26.3 % — ABNORMAL LOW (ref 36.0–46.0)
Hemoglobin: 7.9 g/dL — ABNORMAL LOW (ref 12.0–15.0)

## 2023-12-12 MED ORDER — ONDANSETRON HCL 4 MG PO TABS: 4.0000 mg | ORAL_TABLET | Freq: Four times a day (QID) | ORAL | Status: DC | PRN

## 2023-12-12 MED ORDER — DICLOFENAC SODIUM 1 % EX GEL
2.0000 g | Freq: Four times a day (QID) | CUTANEOUS | Status: DC | PRN
  Administered 2023-12-12 – 2023-12-13 (×2): 2 g via TOPICAL
  Filled 2023-12-12: qty 100

## 2023-12-12 MED ORDER — ONDANSETRON HCL 4 MG/2ML IJ SOLN: 4.0000 mg | Freq: Four times a day (QID) | INTRAMUSCULAR | Status: DC | PRN

## 2023-12-12 MED ORDER — ADULT MULTIVITAMIN W/MINERALS CH
1.0000 | ORAL_TABLET | Freq: Every day | ORAL | Status: DC
  Administered 2023-12-13 – 2023-12-15 (×3): 1 via ORAL
  Filled 2023-12-12 (×3): qty 1

## 2023-12-12 MED ORDER — PANTOPRAZOLE SODIUM 40 MG IV SOLR
40.0000 mg | INTRAVENOUS | Status: DC
  Administered 2023-12-12 – 2023-12-14 (×3): 40 mg via INTRAVENOUS
  Filled 2023-12-12 (×3): qty 10

## 2023-12-12 MED ORDER — METRONIDAZOLE 500 MG/100ML IV SOLN
500.0000 mg | Freq: Once | INTRAVENOUS | Status: AC
  Administered 2023-12-12: 500 mg via INTRAVENOUS
  Filled 2023-12-12: qty 100

## 2023-12-12 MED ORDER — SODIUM CHLORIDE 0.9 % IV SOLN
2.0000 g | Freq: Once | INTRAVENOUS | Status: AC
  Administered 2023-12-12: 2 g via INTRAVENOUS
  Filled 2023-12-12: qty 12.5

## 2023-12-12 MED ORDER — SODIUM CHLORIDE 0.9 % IV SOLN
1.0000 g | Freq: Two times a day (BID) | INTRAVENOUS | Status: DC
  Administered 2023-12-12 – 2023-12-13 (×2): 1 g via INTRAVENOUS
  Filled 2023-12-12 (×2): qty 20

## 2023-12-12 MED ORDER — BISACODYL 5 MG PO TBEC: 5.0000 mg | DELAYED_RELEASE_TABLET | Freq: Every day | ORAL | Status: DC | PRN

## 2023-12-12 MED ORDER — VITAMIN D 25 MCG (1000 UNIT) PO TABS
25.0000 ug | ORAL_TABLET | Freq: Every day | ORAL | Status: DC
  Administered 2023-12-13 – 2023-12-15 (×3): 25 ug via ORAL
  Filled 2023-12-12 (×3): qty 1

## 2023-12-12 MED ORDER — SODIUM CHLORIDE 0.9 % IV SOLN: INTRAVENOUS | Status: AC

## 2023-12-12 MED ORDER — ACETAMINOPHEN 500 MG PO TABS
1000.0000 mg | ORAL_TABLET | Freq: Three times a day (TID) | ORAL | Status: DC | PRN
  Administered 2023-12-12 – 2023-12-13 (×3): 1000 mg via ORAL
  Filled 2023-12-12 (×3): qty 2

## 2023-12-12 MED ORDER — IOHEXOL 300 MG/ML  SOLN
100.0000 mL | Freq: Once | INTRAMUSCULAR | Status: AC | PRN
  Administered 2023-12-12: 85 mL via INTRAVENOUS

## 2023-12-12 MED ORDER — TRAMADOL HCL 50 MG PO TABS: 25.0000 mg | ORAL_TABLET | Freq: Three times a day (TID) | ORAL | Status: DC | PRN

## 2023-12-12 MED ORDER — VANCOMYCIN HCL IN DEXTROSE 1-5 GM/200ML-% IV SOLN
1000.0000 mg | Freq: Once | INTRAVENOUS | Status: AC
Start: 2023-12-12 — End: 2023-12-12
  Administered 2023-12-12: 1000 mg via INTRAVENOUS
  Filled 2023-12-12: qty 200

## 2023-12-12 MED ORDER — ENSURE ENLIVE PO LIQD
237.0000 mL | Freq: Three times a day (TID) | ORAL | Status: DC
  Administered 2023-12-12 – 2023-12-15 (×6): 237 mL via ORAL
  Filled 2023-12-12: qty 237

## 2023-12-12 NOTE — ED Triage Notes (Addendum)
 Patient c/o rectal bleeding X1 day. Reports it looked black. History of same per patient.  Denies pain

## 2023-12-12 NOTE — ED Notes (Signed)
 Patient had large liquid black tarry stool; cleaned and new brief applied, linens were not dirty but new chux applied. Patient's personal gown was soiled, placed in belongings bag. Patient placed in hospital gown.

## 2023-12-12 NOTE — Assessment & Plan Note (Signed)
 BMI 20.1 Continue nutritional supplements once able

## 2023-12-12 NOTE — ED Provider Notes (Signed)
 [facility] Provider Note    Event Date/Time   First MD Initiated Contact with Patient 12/12/23 1229     (approximate)   History   Rectal Bleeding   HPI  Judy [name] is a 89 year old female with history of HTN, CHF presenting to the emergency department for evaluation of dark stools.  Patient accompanied by family who provides collateral history.  They note that patient had been at her baseline when she had onset of about 6 episodes of dark stools today.  Reports that this is worse than her recent admission for a GI bleed.  Patient denies any abdominal pain.  No vomiting.  I reviewed her discharge summary from 09/20/2023.  At that time, patient presented with weakness and shortness of breath found to have acute on chronic CHF as well as symptomatic anemia with a hemoglobin of 4.  Patient declined palliative care/hospice at that time.       Physical Exam   Triage Vital Signs: ED Triage Vitals  Encounter Vitals Group     BP 12/12/23 1232 (!) 151/73     Girls Systolic BP Percentile --      Girls Diastolic BP Percentile --      Boys Systolic BP Percentile --      Boys Diastolic BP Percentile --      Pulse Rate 12/12/23 1232 69     Resp 12/12/23 1232 18     Temp 12/12/23 1232 97.8 F (36.6 C)     Temp Source 12/12/23 1232 Oral     SpO2 12/12/23 1232 100 %     Weight 12/12/23 1231 124 lb (56.2 kg)     Height 12/12/23 1231 5' 6 (1.676 m)     Head Circumference --      Peak Flow --      Pain Score 12/12/23 1231 0     Pain Loc --      Pain Education --      Exclude from Growth Chart --     Most recent vital signs: Vitals:   12/12/23 1232  BP: (!) 151/73  Pulse: 69  Resp: 18  Temp: 97.8 F (36.6 C)  SpO2: 100%     General: Awake, interactive  CV:  Regular rate, good peripheral perfusion.  Resp:  Unlabored respirations.  Abd:  Nondistended, soft, no appreciable tenderness to palpation, dark brown liquidy stool in diaper, Hemoccult  positive Neuro:  Symmetric facial movement, fluid speech   ED Results / Procedures / Treatments   Labs (all labs ordered are listed, but only abnormal results are displayed) Labs Reviewed  CBC WITH DIFFERENTIAL/PLATELET - Abnormal; Notable for the following components:      Result Value   WBC 16.7 (*)    RBC 2.79 (*)    Hemoglobin 8.9 (*)    HCT 28.2 (*)    MCV 101.1 (*)    RDW 17.3 (*)    Platelets 512 (*)    Neutro Abs 13.6 (*)    Monocytes Absolute 1.4 (*)    Abs Immature Granulocytes 0.19 (*)    All other components within normal limits  COMPREHENSIVE METABOLIC PANEL WITH GFR - Abnormal; Notable for the following components:   CO2 18 (*)    Glucose, Bld 159 (*)    BUN 37 (*)    Calcium  8.6 (*)    Total Protein 6.1 (*)    Albumin 3.3 (*)    All other components within normal limits  APTT -  Abnormal; Notable for the following components:   aPTT 23 (*)    All other components within normal limits  PROTIME-INR  TYPE AND SCREEN     EKG EKG independently reviewed and interpreted by myself demonstrates:    RADIOLOGY Imaging independently reviewed and interpreted by myself demonstrates:  CT abdomen pelvis pending  Formal Radiology Read:  No results found.  PROCEDURES:  Critical Care performed: No  Procedures   MEDICATIONS ORDERED IN ED: Medications  iohexol  (OMNIPAQUE ) 300 MG/ML solution 100 mL (85 mLs Intravenous Contrast Given 12/12/23 1436)     IMPRESSION / MDM / ASSESSMENT AND PLAN / ED COURSE  I reviewed the triage vital signs and the nursing notes.  Differential diagnosis includes, but is not limited to, recurrent GI bleed, colitis, diverticulitis, other acute intra-abdominal process  Patient's presentation is most consistent with acute presentation with potential threat to life or bodily function.  89 year old female presenting to the emergency department for evaluation of multiple episodes of dark stools.  Stable vitals on presentation.  Labs  demonstrated new leukocytosis with WBC of 16.7, hemoglobin of 8.9, but actually improved from recent admission.  CT abdomen pelvis pending.  Signed out to oncoming physician at 1500 pending CT and disposition.     FINAL CLINICAL IMPRESSION(S) / ED DIAGNOSES   Final diagnoses:  Dark stools     Rx / DC Orders   ED Discharge Orders     None        Note:  This document was prepared using Dragon voice recognition software and may include unintentional dictation errors.   Levander Slate, MD 12/12/23 [phone]

## 2023-12-12 NOTE — Assessment & Plan Note (Signed)
 Patient noted to have dark stools occasionally mixed with bright red blood but denies having any abdominal pain. Concerning for possible diverticular bleed Will monitor serial H&H and transfuse as needed During her last hospitalization she was evaluated by GI but declined endoscopy. Will consult GI if necessary

## 2023-12-12 NOTE — Assessment & Plan Note (Addendum)
 Noted on CT angiogram done for evaluation of GI bleed with bladder perforation Patient with complaints of urinary frequency and urgency Noted to have marked leukocytosis Urinalysis pending Will treat patient empirically with IV meropenem while awaiting results of urine culture Appreciate urology input, will place Foley catheter to decompress bladder Patient does not want any invasive procedures Official urology consult in a.m.

## 2023-12-12 NOTE — H&P (Signed)
 History and Physical    Patient: Judy [name] FMW:[id number] DOB: September 21, 1925 DOA: 12/12/2023 DOS: the patient was seen and examined on 12/12/2023 PCP: Patient, No Pcp Per  Patient coming from: Home  Chief Complaint:  Chief Complaint  Patient presents with   Rectal Bleeding   HPI: Judy [name] is a 89 y.o. female with medical history significant for hypertension, history of traumatic subdural hematoma, history of CHF who presents to the emergency room with her healthcare power of attorney for evaluation of dark stools.  Patient notes that she has had several episodes of dark stools sometimes containing bright red blood and so she came into the ER for evaluation.  States that she has had a recent admission for GI bleed and at that time she had a hemoglobin of 4 requiring blood transfusion.  Patient declined endoscopic evaluation during that hospitalization. She had a CT angiogram done for evaluation of her GI bleed and it showed gas throughout the wall of the bladder consistent with emphysematous cystitis. Gas within the ventral retroperitoneal space superior to the bladder and adjacent to the LEFT operator space most consistent with bladder perforation. Cholelithiasis without evidence of cholecystitis. Sigmoid diverticulosis without evidence of diverticulitis. 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 Emphysema  She complains of urinary frequency and urgency but denies having any abdominal pain, no hematuria, no fever, no chills, no cough, no shortness of breath, no chest pain, no shortness of breath or any focal deficit. Abnormal labs include a white count of 16.7, platelet count of 512, BUN of 37, bicarb of 18 ER physician discussed CT scan findings with urology on-call who recommended IV antibiotics and Foley catheter to decompress the bladder.  Official consult to be done in a.m. She received a dose of vancomycin , Flagyl  and meropenem and will be admitted  to the hospital for further evaluation        Review of Systems: As mentioned in the history of present illness. All other systems reviewed and are negative. 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   transferred from [facility] to [facility] on 10/12/19, discharged from ED   Past Surgical History:  Procedure Laterality Date   CATARACT EXTRACTION W/PHACO Left 01/21/2020   Procedure: CATARACT EXTRACTION PHACO AND INTRAOCULAR LENS PLACEMENT (IOC) LEFT;  Surgeon: Myrna Adine Anes, MD;  Location: [facility];  Service: Ophthalmology;  Laterality: Left;   CATARACT EXTRACTION W/PHACO Right 03/10/2020   Procedure: CATARACT EXTRACTION PHACO AND INTRAOCULAR LENS PLACEMENT (IOC) RIGHT 4.49  00:44.5;  Surgeon: Myrna Adine Anes, MD;  Location: [facility];  Service: Ophthalmology;  Laterality: Right;   TOTAL HIP ARTHROPLASTY Right 2009   Social History:  reports that she has never smoked. She has never used smokeless tobacco. She reports that she does not drink alcohol and does not use drugs.  No Known Allergies  History reviewed. No pertinent family history.  Prior to Admission medications   Medication Sig Start Date End Date Taking? Authorizing Provider  acetaminophen  (TYLENOL ) 500 MG tablet Take 2 tablets (1,000 mg total) by mouth every 8 (eight) hours as needed. 03/28/23  Yes Medina-Vargas, Monina C, NP  bisacodyl  5 MG EC tablet Take 1 tablet (5 mg total) by mouth daily as needed for moderate constipation. 04/15/23  Yes Scoggins, Amber, NP  diclofenac  Sodium (VOLTAREN ) 1 % GEL Apply 2 g topically every  6 (six) hours as needed. 03/28/23  Yes Medina-Vargas, Monina C, NP  traMADol  (ULTRAM ) 50 MG tablet Take 0.5 tablets (25 mg total) by mouth every 8 (eight) hours as needed. 03/28/23  Yes Medina-Vargas, Monina C, NP  cholecalciferol  (VITAMIN D3) 25 MCG (1000 UNIT) tablet Take 1 tablet (25 mcg  total) by mouth daily. 03/28/23   Medina-Vargas, Monina C, NP  Control Gel Formula Dressing (DUODERM CGF DRESSING) MISC Apply 1 each topically 3 (three) times a week. 11/21/23   Fernand Fredy RAMAN, MD  feeding supplement (ENSURE ENLIVE / ENSURE PLUS) LIQD Take 237 mLs by mouth 3 (three) times daily between meals. 01/25/23   Patel, Sona, MD  ferrous gluconate  (FERGON) 324 MG tablet Take 1 tablet (324 mg total) by mouth 2 (two) times daily with a meal. 09/20/23 10/20/23  Trudy Anthony HERO, MD  furosemide  (LASIX ) 20 MG tablet Take 1 tablet (20 mg total) by mouth daily as needed. Every 24 hours as needed Patient not taking: Reported on 12/12/2023 03/28/23   Medina-Vargas, Monina C, NP  loratadine  (CLARITIN ) 10 MG tablet Take 1 tablet (10 mg total) by mouth daily. 10/11/23   Scoggins, Amber, NP  magnesium  oxide (MAG-OX) 400 (240 Mg) MG tablet Take 1 tablet (400 mg total) by mouth 2 (two) times daily. 03/28/23   Medina-Vargas, Monina C, NP  Multiple Vitamin (MULTIVITAMIN WITH MINERALS) TABS tablet Take 1 tablet by mouth daily. 01/26/23   Patel, Sona, MD  ondansetron  (ZOFRAN ) 4 MG tablet Take 1 tablet (4 mg total) by mouth every 8 (eight) hours as needed for nausea or vomiting. Patient not taking: Reported on 12/12/2023 03/28/23   Medina-Vargas, Monina C, NP  oxybutynin  (DITROPAN ) 5 MG tablet Take 5 mg by mouth 2 (two) times daily.    [provider]  pantoprazole  (PROTONIX ) 40 MG tablet Take 1 tablet (40 mg total) by mouth 2 (two) times daily. 09/20/23 10/20/23  Trudy Anthony HERO, MD  polyethylene glycol (MIRALAX  / GLYCOLAX ) 17 g packet Take 17 g by mouth every other day. Patient not taking: Reported on 12/12/2023 03/28/23   Medina-Vargas, Monina C, NP  senna (SENOKOT) 8.6 MG TABS tablet Take 1 tablet (8.6 mg total) by mouth 2 (two) times daily. Patient not taking: Reported on 12/12/2023 03/28/23   Medina-Vargas, Monina C, NP  vitamin E  (VITAMIN E ) 180 MG (400 UNITS) capsule Take 400 Units by mouth daily.     [provider]    Physical Exam: Vitals:   12/12/23 1231 12/12/23 1232 12/12/23 1600 12/12/23 1718  BP:  (!) 151/73 (!) 139/97   Pulse:  69 83   Resp:  18 20   Temp:  97.8 F (36.6 C)  97.6 F (36.4 C)  TempSrc:  Oral    SpO2:  100% 100%   Weight: 56.2 kg     Height: 5' 6 (1.676 m)      Physical Exam Vitals and nursing note reviewed.  Constitutional:      Appearance: Normal appearance.     Comments: Chronically ill-appearing  HENT:     Head: Normocephalic and atraumatic.     Nose: Nose normal.     Mouth/Throat:     Mouth: Mucous membranes are moist.   Eyes:     Comments: Pale conjunctiva   Cardiovascular:     Rate and Rhythm: Normal rate and regular rhythm.     Heart sounds: Murmur heard.  Pulmonary:     Effort: Pulmonary effort is normal.     Breath sounds:  Normal breath sounds.  Abdominal:     General: Abdomen is flat.     Palpations: Abdomen is soft.   Musculoskeletal:        General: Deformity present.     Cervical back: Normal range of motion and neck supple.     Comments: Deformity left knee   Skin:    General: Skin is warm and dry.   Neurological:     Mental Status: She is alert and oriented to person, place, and time.     Motor: Weakness present.   Psychiatric:        Mood and Affect: Mood normal.        Behavior: Behavior normal.     Data Reviewed: Relevant notes from primary care and specialist visits, past discharge summaries as available in EHR, including Care Everywhere. Prior diagnostic testing as pertinent to current admission diagnoses Updated medications and problem lists for reconciliation ED course, including vitals, labs, imaging, treatment and response to treatment Triage notes, nursing and pharmacy notes and ED provider's notes Notable results as noted in HPI Labs reviewed.  Sodium 142, potassium 4.0, chloride 111, bicarb 18, glucose 159, BUN 37, creatinine 0.77, calcium  8.6, total protein 6.1, albumin 3.3, AST 18, ALT  11, alk phos 56, white count 16.7, hemoglobin 8.9, hematocrit 28.2, platelet count 512 Labs reviewed  Assessment and Plan: * Emphysematous cystitis Noted on CT angiogram done for evaluation of GI bleed with bladder perforation Patient with complaints of urinary frequency and urgency Noted to have marked leukocytosis Urinalysis pending Will treat patient empirically with IV meropenem while awaiting results of urine culture Appreciate urology input, will place Foley catheter to decompress bladder Official urology consult in a.m.  Lower GI bleed Patient noted to have dark stools occasionally mixed with bright red blood but denies having any abdominal pain. Concerning for possible diverticular bleed Will monitor serial H&H and transfuse as needed During her last hospitalization she was evaluated by GI but declined endoscopy. Will consult GI if necessary  Protein-calorie malnutrition, severe BMI 20.1 Continue nutritional supplements once able      Advance Care Planning:   Code Status: Limited: Do not attempt resuscitation (DNR) -DNR-LIMITED -Do Not Intubate/DNI    Consults: Neurology  Family Communication: Plan of care was discussed with her healthcare power of attorney Sonny Sanders at the bedside.  All questions and concerns have been addressed.  She verbalizes understanding and agrees with the plan.  Severity of Illness: The appropriate patient status for this patient is INPATIENT. Inpatient status is judged to be reasonable and necessary in order to provide the required intensity of service to ensure the patient's safety. The patient's presenting symptoms, physical exam findings, and initial radiographic and laboratory data in the context of their chronic comorbidities is felt to place them at high risk for further clinical deterioration. Furthermore, it is not anticipated that the patient will be medically stable for discharge from the hospital within 2 midnights of admission.    * I certify that at the point of admission it is my clinical judgment that the patient will require inpatient hospital care spanning beyond 2 midnights from the point of admission due to high intensity of service, high risk for further deterioration and high frequency of surveillance required.*  Author: Aimee Somerset, MD 12/12/2023 5:39 PM  For on call review [email or web address].

## 2023-12-13 ENCOUNTER — Other Ambulatory Visit: Payer: Self-pay | Admitting: Physician Assistant

## 2023-12-13 DIAGNOSIS — R32 Unspecified urinary incontinence: Secondary | ICD-10-CM

## 2023-12-13 DIAGNOSIS — N3080 Other cystitis without hematuria: Secondary | ICD-10-CM | POA: Diagnosis not present

## 2023-12-13 DIAGNOSIS — Z7189 Other specified counseling: Secondary | ICD-10-CM

## 2023-12-13 LAB — URINALYSIS, COMPLETE (UACMP) WITH MICROSCOPIC
Bilirubin Urine: NEGATIVE
Glucose, UA: NEGATIVE mg/dL
Ketones, ur: NEGATIVE mg/dL
Nitrite: NEGATIVE
Protein, ur: NEGATIVE mg/dL
Specific Gravity, Urine: 1.019 (ref 1.005–1.030)
WBC, UA: >50 WBC/hpf (ref 0–5)
pH: 5 (ref 5.0–8.0)

## 2023-12-13 LAB — BASIC METABOLIC PANEL WITH GFR
Anion gap: 6 (ref 5–15)
BUN: 30 mg/dL — ABNORMAL HIGH (ref 8–23)
CO2: 21 mmol/L — ABNORMAL LOW (ref 22–32)
Calcium: 8.2 mg/dL — ABNORMAL LOW (ref 8.9–10.3)
Chloride: 113 mmol/L — ABNORMAL HIGH (ref 98–111)
Creatinine, Ser: 0.5 mg/dL (ref 0.44–1.00)
GFR, Estimated: >60 mL/min (ref >60)
Glucose, Bld: 97 mg/dL (ref 70–99)
Potassium: 4 mmol/L (ref 3.5–5.1)
Sodium: 140 mmol/L (ref 135–145)

## 2023-12-13 LAB — HEMOGLOBIN AND HEMATOCRIT, BLOOD
HCT: 20.7 % — ABNORMAL LOW (ref 36.0–46.0)
HCT: 26.7 % — ABNORMAL LOW (ref 36.0–46.0)
Hemoglobin: 6.5 g/dL — ABNORMAL LOW (ref 12.0–15.0)
Hemoglobin: 8.4 g/dL — ABNORMAL LOW (ref 12.0–15.0)

## 2023-12-13 LAB — PREPARE RBC (CROSSMATCH)

## 2023-12-13 MED ORDER — SODIUM CHLORIDE 0.9 % IV SOLN
2.0000 g | INTRAVENOUS | Status: DC
  Administered 2023-12-13 – 2023-12-14 (×2): 2 g via INTRAVENOUS
  Filled 2023-12-13 (×2): qty 20

## 2023-12-13 MED ORDER — SODIUM CHLORIDE 0.9% IV SOLUTION: Freq: Once | INTRAVENOUS | Status: AC

## 2023-12-13 NOTE — Progress Notes (Signed)
 PROGRESS NOTE   HPI was taken from Dr. Lanetta: Judy [name] is a 89 y.o. female with medical history significant for hypertension, history of traumatic subdural hematoma, history of CHF who presents to the emergency room with her healthcare power of attorney for evaluation of dark stools.  Patient notes that she has had several episodes of dark stools sometimes containing bright red blood and so she came into the ER for evaluation.  States that she has had a recent admission for GI bleed and at that time she had a hemoglobin of 4 requiring blood transfusion.  Patient declined endoscopic evaluation during that hospitalization. She had a CT angiogram done for evaluation of her GI bleed and it showed gas throughout the wall of the bladder consistent with emphysematous cystitis. Gas within the ventral retroperitoneal space superior to the bladder and adjacent to the LEFT operator space most consistent with bladder perforation. Cholelithiasis without evidence of cholecystitis. Sigmoid diverticulosis without evidence of diverticulitis. 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 Emphysema  She complains of urinary frequency and urgency but denies having any abdominal pain, no hematuria, no fever, no chills, no cough, no shortness of breath, no chest pain, no shortness of breath or any focal deficit. Abnormal labs include a white count of 16.7, platelet count of 512, BUN of 37, bicarb of 18 ER physician discussed CT scan findings with urology on-call who recommended IV antibiotics and Foley catheter to decompress the bladder.  Official consult to be done in a.m. She received a dose of vancomycin , Flagyl  and meropenem and will be admitted to the hospital for further evaluation    MORRISON MASSER  FMW:[id number] DOB: 1926/02/15 DOA: 12/12/2023 PCP: Patient, No Pcp Per   Assessment & Plan:   Principal Problem:   Emphysematous cystitis Active Problems:   Lower GI bleed    Protein-calorie malnutrition, severe  Assessment and Plan:  Failure to thrive: secondary to all below. Discussed palliative care vs hospice/comfort care. Palliative care consulted. Pt and pt's family wanted to continue w/ treatment but w/o invasive procedures until a discussion is had w/ palliative care.   Emphysematous cystitis: w/ possible bladder perforation as per CT. Urine cx is pending. Continue on IV rocephin . Continue w/ foley x 2 weeks, outpatient cystogram and abxs x 10-14 days as per uro. Uro following and recs apprec    Lower GI bleed: w/ dark stools occasionally mixed with bright red blood. Pt does NOT want any invasive procedures so GI consult was deferred at this time. Will transfuse 1 unit of pRBCs today as Hb 6.5. Repeat H&H ordered   Severe protein-calorie malnutrition: continue w/ po nutritional supplements     DVT prophylaxis: SCDs Code Status: DNR Family Communication: discussed pt's care w/ MPOA, Sonny, and answered her questions  Disposition Plan: unclear  Level of care: Telemetry Medical  Status is: Inpatient Remains inpatient appropriate because: severity of care    Consultants:  Uro Palliative care   Procedures:   Antimicrobials: rocephin     Subjective: Pt  c/o malaise  Objective: Vitals:   12/13/23 0551 12/13/23 0729 12/13/23 1144 12/13/23 1159  BP: 124/78 126/66 131/65 127/68  Pulse: 79 79 88 87  Resp: 15 18 16 16   Temp: 98.4 F (36.9 C)  98.6 F (37 C) 98 F (36.7 C)  TempSrc: Oral  Oral Oral  SpO2: 100% 100% 100% 100%  Weight:      Height:        Intake/Output Summary (  Last 24 hours) at 12/13/2023 1359 Last data filed at 12/13/2023 0830 Gross per 24 hour  Intake 816.82 ml  Output 20 ml  Net 796.82 ml   Filed Weights   12/12/23 1231  Weight: 56.2 kg    Examination:  General exam: Appears calm and comfortable  Respiratory system: Clear to auscultation. Respiratory effort normal. Cardiovascular system: S1 & S2 +. No rubs,  gallops or clicks Gastrointestinal system: Abdomen is nondistended, soft and nontender.  Normal bowel sounds heard. Central nervous system: Alert and oriented. Moves all extremities  Psychiatry: Judgement and insight appear normal. Mood & affect appropriate.     Data Reviewed: I have personally reviewed following labs and imaging studies  CBC: Recent Labs  Lab 12/12/23 1255 12/12/23 1854 12/13/23 0443  WBC 16.7*  --   --   NEUTROABS 13.6*  --   --   HGB 8.9* 7.9* 6.5*  HCT 28.2* 26.3* 20.7*  MCV 101.1*  --   --   PLT 512*  --   --    Basic Metabolic Panel: Recent Labs  Lab 12/12/23 1255 12/13/23 0443  NA 142 140  K 4.0 4.0  CL 111 113*  CO2 18* 21*  GLUCOSE 159* 97  BUN 37* 30*  CREATININE 0.77 0.50  CALCIUM  8.6* 8.2*   GFR: Estimated Creatinine Clearance: 37.3 mL/min (by C-G formula based on SCr of 0.5 mg/dL). Liver Function Tests: Recent Labs  Lab 12/12/23 1255  AST 18  ALT 11  ALKPHOS 56  BILITOT 0.8  PROT 6.1*  ALBUMIN 3.3*   No results for input(s): LIPASE, AMYLASE in the last 168 hours. No results for input(s): AMMONIA in the last 168 hours. Coagulation Profile: Recent Labs  Lab 12/12/23 1255  INR 1.0   Cardiac Enzymes: No results for input(s): CKTOTAL, CKMB, CKMBINDEX, TROPONINI in the last 168 hours. BNP (last 3 results) No results for input(s): PROBNP in the last 8760 hours. HbA1C: No results for input(s): HGBA1C in the last 72 hours. CBG: No results for input(s): GLUCAP in the last 168 hours. Lipid Profile: No results for input(s): CHOL, HDL, LDLCALC, TRIG, CHOLHDL, LDLDIRECT in the last 72 hours. Thyroid Function Tests: No results for input(s): TSH, T4TOTAL, FREET4, T3FREE, THYROIDAB in the last 72 hours. Anemia Panel: No results for input(s): VITAMINB12, FOLATE, FERRITIN, TIBC, IRON , RETICCTPCT in the last 72 hours. Sepsis Labs: No results for input(s): PROCALCITON,  LATICACIDVEN in the last 168 hours.  No results found for this or any previous visit (from the past 240 hours).       Radiology Studies: CT ABDOMEN PELVIS W CONTRAST Addendum Date: 12/12/2023 ADDENDUM REPORT: 12/12/2023 15:59 ADDENDUM: Findings conveyed to Dr Sung 12/12/2023  at15:31. Electronically Signed   By: Jackquline Boxer M.D.   On: 12/12/2023 15:59   Result Date: 12/12/2023 CLINICAL DATA:  Abdominal pain.  Acute nonlocalized EXAM: CT ABDOMEN AND PELVIS WITH CONTRAST TECHNIQUE: Multidetector CT imaging of the abdomen and pelvis was performed using the standard protocol following bolus administration of intravenous contrast. RADIATION DOSE REDUCTION: This exam was performed according to the departmental dose-optimization program which includes automated exposure control, adjustment of the mA and/or kV according to patient size and/or use of iterative reconstruction technique. CONTRAST:  85mL OMNIPAQUE  IOHEXOL  300 MG/ML  SOLN COMPARISON:  None Available. FINDINGS: Lower chest: Lung bases are clear. Hepatobiliary: No focal hepatic lesion. Large gallstone measuring 23 mm in the fundus of the gallbladder. No gallbladder inflammation. No biliary duct dilatation. Common bile duct is normal.  Pancreas: Pancreas is normal. No ductal dilatation. No pancreatic inflammation. Spleen: Normal spleen Adrenals/urinary tract: Adrenal glands normal. Hydronephrosis or ureteral obstruction. Nonenhancing cyst in the LEFT kidney measures 16 mm. Significant streak artifact from adjacent RIGHT hip prosthetic limits exam of the pelvis. There is gas throughout the wall the bladder best seen on coronal image 32/5. There is gas within lumen the bladder. There is gas within the retroperitoneal space superior to the bladder (image 61/2. Extraluminal gas adjacent to the LEFT operator space on image 62/2. Stomach/Bowel: Stomach, small bowel, appendix, and cecum are normal. There is stool within the descending colon.  There are diverticula through the sigmoid colon. Colon passes adjacent to the retroperitoneal gas described above. Favor gas from bladder origin rather than colon origin. Vascular/Lymphatic: Abdominal aorta is normal caliber. No periportal or retroperitoneal adenopathy. No pelvic adenopathy. Reproductive: Uterus unremarkable. Other: No free fluid. Musculoskeletal: No aggressive osseous lesion. IMPRESSION: 1. Gas throughout the wall of the bladder consistent with emphysematous cystitis. 2. Gas within the ventral retroperitoneal space superior to the bladder and adjacent to the LEFT operator space most consistent with bladder perforation. 3. Cholelithiasis without evidence of cholecystitis. 4. Sigmoid diverticulosis without evidence of diverticulitis. 5. 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 6.  Emphysema (ICD10-J43.9). Electronically Signed: By: Jackquline Boxer M.D. On: 12/12/2023 15:17        Scheduled Meds:  cholecalciferol   25 mcg Oral Daily   feeding supplement  237 mL Oral TID BM   multivitamin with minerals  1 tablet Oral Daily   pantoprazole  (PROTONIX ) IV  40 mg Intravenous Q24H   Continuous Infusions:  cefTRIAXone  (ROCEPHIN )  IV       LOS: 1 day        Anthony CHRISTELLA Pouch, MD [facility] Pager [phone]  If 7PM-7AM, please contact night-coverage [email or web address] 12/13/2023, 1:59 PM

## 2023-12-13 NOTE — Consult Note (Addendum)
 Consultation Note Date: 12/13/2023   Patient Name: Judy [name]  DOB: 01/18/1926  MRN: [id number]  Age / Sex: 89 y.o., female  PCP: Patient, No Pcp Per Referring Physician: Trudy Anthony HERO, MD  Reason for Consultation: Establishing goals of care  HPI/Patient Profile: PER H&P : Judy [name] is a 89 y.o. female with medical history significant for hypertension, history of traumatic subdural hematoma, history of CHF who presents to the emergency room with her healthcare power of attorney for evaluation of dark stools.  Patient notes that she has had several episodes of dark stools sometimes containing bright red blood and so she came into the ER for evaluation.  States that she has had a recent admission for GI bleed and at that time she had a hemoglobin of 4 requiring blood transfusion.  Patient declined endoscopic evaluation during that hospitalization.   Clinical Assessment and Goals of Care: Notes, diagnostics and labs reviewed.  Patient known to PMT from previous admission.  Patient is currently resting in bed at this time.  She discusses last hospitalization when she was here for knee pain.  She states she received another steroid injection but does not really help the pain.  She states because of this she has been less mobile.  She discusses her current hospitalization.  She states she is considering her options.  She states she has money to pay for what ever care is needed.  She states she is considering potentially going home with hospice and having paid caregivers to provide her care.  She discusses that she has been doing well today and tired.  Discussed following up tomorrow.  She confirms that her neighbors Sonny and Fredderick are still her HPOAs if needed.    SUMMARY OF RECOMMENDATIONS   PMT will follow-up tomorrow.     Primary Diagnoses: Present on Admission:  Emphysematous  cystitis  Lower GI bleed  Protein-calorie malnutrition, severe   I have reviewed the medical record, interviewed the patient and family, and examined the patient. The following aspects are pertinent.  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   transferred from [facility] to [facility] on 10/12/19, discharged from ED   Social History   Socioeconomic History   Marital status: Married    Spouse name: Not on file   Number of children: Not on file   Years of education: Not on file   Highest education level: Not on file  Occupational History   Not on file  Tobacco Use   Smoking status: Never   Smokeless tobacco: Never  Vaping Use   Vaping status: Never Used  Substance and Sexual Activity   Alcohol use: No   Drug use: Never   Sexual activity: Not on file  Other Topics Concern   Not on file  Social History Narrative   Not on file   Social Drivers of Health   Financial Resource Strain: Not on  file  Food Insecurity: No Food Insecurity (12/12/2023)   Hunger Vital Sign    Worried About Running Out of Food in the Last Year: Never true    Ran Out of Food in the Last Year: Never true  Transportation Needs: No Transportation Needs (12/12/2023)   PRAPARE - Administrator, Civil Service (Medical): No    Lack of Transportation (Non-Medical): No  Physical Activity: Not on file  Stress: Not on file  Social Connections: Moderately Isolated (12/12/2023)   Social Connection and Isolation Panel    Frequency of Communication with Friends and Family: More than three times a week    Frequency of Social Gatherings with Friends and Family: More than three times a week    Attends Religious Services: 1 to 4 times per year    Active Member of [employer] or Organizations: No    Attends Banker Meetings: Never    Marital Status: Widowed   History reviewed. No pertinent family  history. Scheduled Meds:  cholecalciferol   25 mcg Oral Daily   feeding supplement  237 mL Oral TID BM   multivitamin with minerals  1 tablet Oral Daily   pantoprazole  (PROTONIX ) IV  40 mg Intravenous Q24H   Continuous Infusions:  cefTRIAXone  (ROCEPHIN )  IV     PRN Meds:.acetaminophen , bisacodyl , diclofenac  Sodium, ondansetron  **OR** ondansetron  (ZOFRAN ) IV, traMADol  Medications Prior to Admission:  Prior to Admission medications   Medication Sig Start Date End Date Taking? Authorizing Provider  acetaminophen  (TYLENOL ) 500 MG tablet Take 2 tablets (1,000 mg total) by mouth every 8 (eight) hours as needed. 03/28/23  Yes Medina-Vargas, Monina C, NP  celecoxib (CELEBREX) 100 MG capsule Take 100 mg by mouth 2 (two) times daily. 11/18/23  Yes [provider]  cholecalciferol  (VITAMIN D3) 25 MCG (1000 UNIT) tablet Take 1 tablet (25 mcg total) by mouth daily. 03/28/23  Yes Medina-Vargas, Monina C, NP  Control Gel Formula Dressing (DUODERM CGF DRESSING) MISC Apply 1 each topically 3 (three) times a week. 11/21/23  Yes Fernand Fredy RAMAN, MD  diclofenac  Sodium (VOLTAREN ) 1 % GEL Apply 2 g topically every 6 (six) hours as needed. 03/28/23  Yes Medina-Vargas, Monina C, NP  loratadine  (CLARITIN ) 10 MG tablet Take 1 tablet (10 mg total) by mouth daily. 10/11/23  Yes Scoggins, Hospital doctor, NP  magnesium  oxide (MAG-OX) 400 (240 Mg) MG tablet Take 1 tablet (400 mg total) by mouth 2 (two) times daily. 03/28/23  Yes Medina-Vargas, Monina C, NP  Multiple Vitamin (MULTIVITAMIN WITH MINERALS) TABS tablet Take 1 tablet by mouth daily. 01/26/23  Yes Patel, Sona, MD  oxybutynin  (DITROPAN ) 5 MG tablet Take 5 mg by mouth 2 (two) times daily.   Yes [provider]  pantoprazole  (PROTONIX ) 40 MG tablet Take 1 tablet (40 mg total) by mouth 2 (two) times daily. 09/20/23 12/12/23 Yes Trudy Anthony HERO, MD  tiZANidine (ZANAFLEX) 2 MG tablet Take 2 mg by mouth at bedtime. 11/08/23 11/07/24 Yes [provider]   traMADol  (ULTRAM ) 50 MG tablet Take 0.5 tablets (25 mg total) by mouth every 8 (eight) hours as needed. 03/28/23  Yes Medina-Vargas, Monina C, NP  vitamin E  (VITAMIN E ) 180 MG (400 UNITS) capsule Take 400 Units by mouth daily.   Yes [provider]  feeding supplement (ENSURE ENLIVE / ENSURE PLUS) LIQD Take 237 mLs by mouth 3 (three) times daily between meals. 01/25/23   Patel, Sona, MD  ferrous gluconate  (FERGON) 324 MG tablet Take 1 tablet (324 mg total)  by mouth 2 (two) times daily with a meal. 09/20/23 10/20/23  Trudy Anthony HERO, MD   No Known Allergies Review of Systems  All other systems reviewed and are negative.   Physical Exam Pulmonary:     Effort: Pulmonary effort is normal.   Neurological:     Mental Status: She is alert.     Vital Signs: BP 129/76 (BP Location: Right Arm)   Pulse 90   Temp 98 F (36.7 C) (Oral)   Resp 16   Ht 5' 6 (1.676 m)   Wt 56.2 kg   SpO2 92%   BMI 20.01 kg/m  Pain Scale: 0-10   Pain Score: 5    SpO2: SpO2: 92 % O2 Device:SpO2: 92 % O2 Flow Rate: .   IO: Intake/output summary:  Intake/Output Summary (Last 24 hours) at 12/13/2023 1554 Last data filed at 12/13/2023 1430 Gross per 24 hour  Intake 1371.82 ml  Output 20 ml  Net 1351.82 ml    LBM: Last BM Date : 12/12/23 Baseline Weight: Weight: 56.2 kg Most recent weight: Weight: 56.2 kg       Signed by: Camelia Lewis, NP   Please contact Palliative Medicine Team phone at [phone] for questions and concerns.  For individual provider: See Tracey

## 2023-12-13 NOTE — Consult Note (Signed)
 Urology Consult  I have been asked to see the patient by Dr. Lanetta, for evaluation and management of emphysematous cystitis with bladder perforation.  Chief Complaint: Rectal bleeding  History of Present Illness: Judy [name] is a 89 y.o. year old female with PMH traumatic subdural hematoma, CHF, and recent GI bleed who presented to the ED yesterday with reports of recurrent dark stools.  CTAP with contrast on presentation notable for gas throughout the bladder wall consistent with emphysematous cystitis as well as gas within the ventral retroperitoneal space superior to the bladder most consistent with bladder perforation.  Notably, streak artifact from her right hip prosthesis limits pelvic exam.  Admission labs notable for white count 16.7; creatinine 0.77, at baseline; and UA with >50 WBC/hpf, 6-10 RBCs/hpf, and rare bacteria.  Urine culture pending, on antibiotics as below.  Notably, it appears that her UA and culture were drawn after antibiotics were started.  Foley catheter in place today draining clear, yellow urine.  She reports feeling poorly, but she denies pain.  Anti-infectives (From admission, onward)    Start     Dose/Rate Route Frequency Ordered Stop   12/12/23 2000  meropenem (MERREM) 1 g in sodium chloride  0.9 % 100 mL IVPB        1 g 200 mL/hr over 30 Minutes Intravenous Every 12 hours 12/12/23 1701     12/12/23 1545  ceFEPIme  (MAXIPIME ) 2 g in sodium chloride  0.9 % 100 mL IVPB        2 g 200 mL/hr over 30 Minutes Intravenous  Once 12/12/23 1531 12/12/23 1622   12/12/23 1545  metroNIDAZOLE  (FLAGYL ) IVPB 500 mg        500 mg 100 mL/hr over 60 Minutes Intravenous  Once 12/12/23 1531 12/12/23 1725   12/12/23 1545  vancomycin  (VANCOCIN ) IVPB 1000 mg/200 mL premix        1,000 mg 200 mL/hr over 60 Minutes Intravenous  Once 12/12/23 1531 12/12/23 1916        Past Medical History:  Diagnosis Date   Heart murmur    High cholesterol    Hypertension     Traumatic subdural hematoma (HCC) 10/12/2019   transferred from [facility] to [facility] on 10/12/19, discharged from ED    Past Surgical History:  Procedure Laterality Date   CATARACT EXTRACTION W/PHACO Left 01/21/2020   Procedure: CATARACT EXTRACTION PHACO AND INTRAOCULAR LENS PLACEMENT (IOC) LEFT;  Surgeon: Myrna Adine Anes, MD;  Location: [facility];  Service: Ophthalmology;  Laterality: Left;   CATARACT EXTRACTION W/PHACO Right 03/10/2020   Procedure: CATARACT EXTRACTION PHACO AND INTRAOCULAR LENS PLACEMENT (IOC) RIGHT 4.49  00:44.5;  Surgeon: Myrna Adine Anes, MD;  Location: [facility];  Service: Ophthalmology;  Laterality: Right;   TOTAL HIP ARTHROPLASTY Right 2009    Home Medications:  Current Meds  Medication Sig   acetaminophen  (TYLENOL ) 500 MG tablet Take 2 tablets (1,000 mg total) by mouth every 8 (eight) hours as needed.   celecoxib (CELEBREX) 100 MG capsule Take 100 mg by mouth 2 (two) times daily.   cholecalciferol  (VITAMIN D3) 25 MCG (1000 UNIT) tablet Take 1 tablet (25 mcg total) by mouth daily.   Control Gel Formula Dressing (DUODERM CGF DRESSING) MISC Apply 1 each topically 3 (three) times a week.   diclofenac  (VOLTAREN ) 75 MG EC tablet Take 75 mg by mouth 2 (two) times daily.   diclofenac  Sodium (VOLTAREN ) 1 % GEL Apply 2 g topically every 6 (six) hours as needed.   loratadine  (  CLARITIN ) 10 MG tablet Take 1 tablet (10 mg total) by mouth daily.   magnesium  oxide (MAG-OX) 400 (240 Mg) MG tablet Take 1 tablet (400 mg total) by mouth 2 (two) times daily.   Multiple Vitamin (MULTIVITAMIN WITH MINERALS) TABS tablet Take 1 tablet by mouth daily.   oxybutynin  (DITROPAN ) 5 MG tablet Take 5 mg by mouth 2 (two) times daily.   pantoprazole  (PROTONIX ) 40 MG tablet Take 1 tablet (40 mg total) by mouth 2 (two) times daily.   tiZANidine (ZANAFLEX) 2 MG tablet Take 2 mg by mouth at bedtime.   traMADol  (ULTRAM ) 50 MG tablet Take 0.5 tablets (25 mg total) by mouth every 8 (eight)  hours as needed.   vitamin E  (VITAMIN E ) 180 MG (400 UNITS) capsule Take 400 Units by mouth daily.   [DISCONTINUED] bisacodyl  5 MG EC tablet Take 1 tablet (5 mg total) by mouth daily as needed for moderate constipation.    Allergies: No Known Allergies  History reviewed. No pertinent family history.  Social History:  reports that she has never smoked. She has never used smokeless tobacco. She reports that she does not drink alcohol and does not use drugs.  ROS: A complete review of systems was performed.  All systems are negative except for pertinent findings as noted.  Physical Exam:  Vital signs in last 24 hours: Temp:  [97.6 F (36.4 C)-98.4 F (36.9 C)] 98.4 F (36.9 C) (07/01 0551) Pulse Rate:  [69-87] 79 (07/01 0729) Resp:  [15-20] 18 (07/01 0729) BP: (124-163)/(66-97) 126/66 (07/01 0729) SpO2:  [100 %] 100 % (07/01 0729) Weight:  [56.2 kg] 56.2 kg (06/30 1231) Constitutional:  Alert, frail-appearing, no acute distress HEENT: [place] AT, moist mucus membranes Cardiovascular: No clubbing, cyanosis, or edema Respiratory: Normal respiratory effort Skin: No rashes, bruises or suspicious lesions Neurologic: Grossly intact, no focal deficits, moving all 4 extremities Psychiatric: Normal mood and affect  Laboratory Data:  Recent Labs    12/12/23 1255 12/12/23 1854 12/13/23 0443  WBC 16.7*  --   --   HGB 8.9* 7.9* 6.5*  HCT 28.2* 26.3* 20.7*   Recent Labs    12/12/23 1255 12/13/23 0443  NA 142 140  K 4.0 4.0  CL 111 113*  CO2 18* 21*  GLUCOSE 159* 97  BUN 37* 30*  CREATININE 0.77 0.50  CALCIUM  8.6* 8.2*   Recent Labs    12/12/23 1255  INR 1.0   Urinalysis    Component Value Date/Time   COLORURINE YELLOW (A) 12/13/2023 0830   APPEARANCEUR HAZY (A) 12/13/2023 0830   LABSPEC 1.019 12/13/2023 0830   PHURINE 5.0 12/13/2023 0830   GLUCOSEU NEGATIVE 12/13/2023 0830   HGBUR SMALL (A) 12/13/2023 0830   BILIRUBINUR NEGATIVE 12/13/2023 0830   BILIRUBINUR neg  04/13/2023 1029   KETONESUR NEGATIVE 12/13/2023 0830   PROTEINUR NEGATIVE 12/13/2023 0830   UROBILINOGEN 0.2 04/13/2023 1029   NITRITE NEGATIVE 12/13/2023 0830   LEUKOCYTESUR LARGE (A) 12/13/2023 0830   Results for orders placed or performed in visit on 04/13/23  Culture, Urine     Status: None   Collection Time: 04/13/23 10:49 AM   Specimen: Urine   UC  Result Value Ref Range Status   Urine Culture, Routine Final report  Final   Organism ID, Bacteria Comment  Final    Comment: Mixed urogenital flora Less than 10,000 colonies/mL     Radiologic Imaging: CT ABDOMEN PELVIS W CONTRAST Addendum Date: 12/12/2023 ADDENDUM REPORT: 12/12/2023 15:59 ADDENDUM: Findings conveyed to Dr  Paduchowskion 12/12/2023  at15:31. Electronically Signed   By: Jackquline Boxer M.D.   On: 12/12/2023 15:59   Result Date: 12/12/2023 CLINICAL DATA:  Abdominal pain.  Acute nonlocalized EXAM: CT ABDOMEN AND PELVIS WITH CONTRAST TECHNIQUE: Multidetector CT imaging of the abdomen and pelvis was performed using the standard protocol following bolus administration of intravenous contrast. RADIATION DOSE REDUCTION: This exam was performed according to the departmental dose-optimization program which includes automated exposure control, adjustment of the mA and/or kV according to patient size and/or use of iterative reconstruction technique. CONTRAST:  85mL OMNIPAQUE  IOHEXOL  300 MG/ML  SOLN COMPARISON:  None Available. FINDINGS: Lower chest: Lung bases are clear. Hepatobiliary: No focal hepatic lesion. Large gallstone measuring 23 mm in the fundus of the gallbladder. No gallbladder inflammation. No biliary duct dilatation. Common bile duct is normal. Pancreas: Pancreas is normal. No ductal dilatation. No pancreatic inflammation. Spleen: Normal spleen Adrenals/urinary tract: Adrenal glands normal. Hydronephrosis or ureteral obstruction. Nonenhancing cyst in the LEFT kidney measures 16 mm. Significant streak artifact from adjacent  RIGHT hip prosthetic limits exam of the pelvis. There is gas throughout the wall the bladder best seen on coronal image 32/5. There is gas within lumen the bladder. There is gas within the retroperitoneal space superior to the bladder (image 61/2. Extraluminal gas adjacent to the LEFT operator space on image 62/2. Stomach/Bowel: Stomach, small bowel, appendix, and cecum are normal. There is stool within the descending colon. There are diverticula through the sigmoid colon. Colon passes adjacent to the retroperitoneal gas described above. Favor gas from bladder origin rather than colon origin. Vascular/Lymphatic: Abdominal aorta is normal caliber. No periportal or retroperitoneal adenopathy. No pelvic adenopathy. Reproductive: Uterus unremarkable. Other: No free fluid. Musculoskeletal: No aggressive osseous lesion. IMPRESSION: 1. Gas throughout the wall of the bladder consistent with emphysematous cystitis. 2. Gas within the ventral retroperitoneal space superior to the bladder and adjacent to the LEFT operator space most consistent with bladder perforation. 3. Cholelithiasis without evidence of cholecystitis. 4. Sigmoid diverticulosis without evidence of diverticulitis. 5. 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 6.  Emphysema (ICD10-J43.9). Electronically Signed: By: Jackquline Boxer M.D. On: 12/12/2023 15:17   Assessment & Plan:  89 y.o. female with PMH traumatic subdural hematoma, CHF, and a recent history of GI bleed now admitted with likely recurrent GI bleed and CT findings consistent with emphysematous cystitis with bladder perforation.  Given age, comorbidities, and frailty, recommend conservative management with Foley catheter placement for bladder decompression and antibiotics.  Recommend keeping Foley catheter in place for 2 weeks with plans for outpatient cystogram.  We discussed that she will need to keep the catheter until her bladder perforation heals on its own.  She  expressed understanding.  Recommendations: - Okay for diet from the urologic perspective, no plans for surgery - Continue Foley catheter x 2 weeks - Continue antibiotics and follow cultures for total of 10 to 14 days of culture appropriate therapy - Outpatient follow-up with cystogram prior, will arrange  Thank you for involving me in this patient's care, please page with any further questions or concerns.  Benjermin Korber, PA-C 12/13/2023 9:57 AM

## 2023-12-14 DIAGNOSIS — D62 Acute posthemorrhagic anemia: Secondary | ICD-10-CM | POA: Diagnosis present

## 2023-12-14 DIAGNOSIS — Z7189 Other specified counseling: Secondary | ICD-10-CM | POA: Diagnosis not present

## 2023-12-14 DIAGNOSIS — L8990 Pressure ulcer of unspecified site, unspecified stage: Secondary | ICD-10-CM | POA: Insufficient documentation

## 2023-12-14 DIAGNOSIS — N3080 Other cystitis without hematuria: Secondary | ICD-10-CM | POA: Diagnosis not present

## 2023-12-14 LAB — URINE CULTURE
Culture: NO GROWTH
Special Requests: NORMAL

## 2023-12-14 LAB — BASIC METABOLIC PANEL WITH GFR
Anion gap: 7 (ref 5–15)
BUN: 26 mg/dL — ABNORMAL HIGH (ref 8–23)
CO2: 21 mmol/L — ABNORMAL LOW (ref 22–32)
Calcium: 8.5 mg/dL — ABNORMAL LOW (ref 8.9–10.3)
Chloride: 112 mmol/L — ABNORMAL HIGH (ref 98–111)
Creatinine, Ser: 0.65 mg/dL (ref 0.44–1.00)
GFR, Estimated: >60 mL/min (ref >60)
Glucose, Bld: 97 mg/dL (ref 70–99)
Potassium: 3.6 mmol/L (ref 3.5–5.1)
Sodium: 140 mmol/L (ref 135–145)

## 2023-12-14 LAB — TYPE AND SCREEN
ABO/RH(D): O POS
Antibody Screen: NEGATIVE
Unit division: 0

## 2023-12-14 LAB — CBC
HCT: 24.9 % — ABNORMAL LOW (ref 36.0–46.0)
Hemoglobin: 8.1 g/dL — ABNORMAL LOW (ref 12.0–15.0)
MCH: 31.2 pg (ref 26.0–34.0)
MCHC: 32.5 g/dL (ref 30.0–36.0)
MCV: 95.8 fL (ref 80.0–100.0)
Platelets: 348 10*3/uL (ref 150–400)
RBC: 2.6 MIL/uL — ABNORMAL LOW (ref 3.87–5.11)
RDW: 18.4 % — ABNORMAL HIGH (ref 11.5–15.5)
WBC: 9.8 10*3/uL (ref 4.0–10.5)
nRBC: 0.3 % — ABNORMAL HIGH (ref 0.0–0.2)

## 2023-12-14 LAB — BPAM RBC
Blood Product Expiration Date: 202507272359
ISSUE DATE / TIME: 202507011136
Unit Type and Rh: 5100

## 2023-12-14 MED ORDER — FUROSEMIDE 10 MG/ML IJ SOLN
20.0000 mg | Freq: Once | INTRAMUSCULAR | Status: AC
  Administered 2023-12-14: 20 mg via INTRAVENOUS
  Filled 2023-12-14: qty 4

## 2023-12-14 MED ORDER — CHLORHEXIDINE GLUCONATE CLOTH 2 % EX PADS
6.0000 | MEDICATED_PAD | Freq: Every day | CUTANEOUS | Status: DC
  Administered 2023-12-14 – 2023-12-15 (×2): 6 via TOPICAL

## 2023-12-14 MED ORDER — CEFUROXIME AXETIL 500 MG PO TABS: 500.0000 mg | ORAL_TABLET | Freq: Two times a day (BID) | ORAL | 0 refills | Status: AC

## 2023-12-14 NOTE — Progress Notes (Signed)
 Daily Progress Note   Patient Name: Judy [name]       Date: 12/14/2023 DOB: Apr 11, 1926  Age: 89 y.o. MRN#: [id number] Attending Physician: Lanetta Lingo, MD Primary Care Physician: Patient, No Pcp Per Admit Date: 12/12/2023  Reason for Consultation/Follow-up: Establishing goals of care  Subjective: Notes and labs reviewed.  Spoke with attending regarding case.  Up to see patient.  One of her caregivers Alana is at bedside along with a friend of hers Rock.  Patient is very clear that she wants to go home.  Patient is alert and oriented; patient permitted Sonny who is patient's H POA if 1 is needed to be called on speaker phone from the bedside.  With conversation, patient and Sonny are amenable to patient going home with hospice care.  They agree that they will hire what ever extra help is needed to keep her at home.  Patient is clear that she does not want symptom management at this time but is amenable to having medications at home.  Stepped out and called Sonny to answer any additional questions.  Discussed likely short prognosis.  Sonny states she would like [facility] as they are familiar with this company.  She shares patient's husband went to the hospice facility in [place] before he died.  TOC, attending, hospice liaison updated.  Length of Stay: 2  Current Medications: Scheduled Meds:   cholecalciferol   25 mcg Oral Daily   feeding supplement  237 mL Oral TID BM   multivitamin with minerals  1 tablet Oral Daily   pantoprazole  (PROTONIX ) IV  40 mg Intravenous Q24H    Continuous Infusions:  cefTRIAXone  (ROCEPHIN )  IV 2 g (12/13/23 2107)    PRN Meds: acetaminophen , bisacodyl , diclofenac  Sodium, ondansetron  **OR** ondansetron  (ZOFRAN ) IV, traMADol   Physical  Exam Pulmonary:     Effort: Pulmonary effort is normal.  Abdominal:     Comments: Frank dark red blood between patient's legs.  Genitourinary:    Comments: Foley in place Skin:    General: Skin is warm and dry.  Neurological:     Mental Status: She is alert.             Vital Signs: BP (!) 166/84 (BP Location: Right Arm)   Pulse 89   Temp 98.5 F (36.9 C)   Resp 16  Ht 5' 6 (1.676 m)   Wt 56.2 kg   SpO2 100%   BMI 20.01 kg/m  SpO2: SpO2: 100 % O2 Device: O2 Device: Room Air O2 Flow Rate:    Intake/output summary:  Intake/Output Summary (Last 24 hours) at 12/14/2023 1314 Last data filed at 12/14/2023 0900 Gross per 24 hour  Intake 1373.67 ml  Output 1800 ml  Net -426.33 ml   LBM: Last BM Date : 12/12/23 Baseline Weight: Weight: 56.2 kg Most recent weight: Weight: 56.2 kg  Patient Active Problem List   Diagnosis Date Noted   Emphysematous cystitis 12/12/2023   Lower GI bleed 12/12/2023   Protein-calorie malnutrition, severe 09/19/2023   Severe anemia 09/17/2023   Vomiting 09/16/2023   CHF (congestive heart failure) (HCC) 09/16/2023   Edema of left ankle 05/16/2023   Urinary frequency 04/13/2023   Malnutrition of moderate degree 01/20/2023   Acute respiratory failure with hypoxia (HCC) 01/16/2023   Thrombocytosis 01/16/2023   Abnormal gall bladder diagnostic imaging 01/11/2023   Lactic acidosis 01/11/2023   Leukocytosis (leucocytosis) 01/11/2023   Constipation by delayed colonic transit 01/11/2023   Sepsis (HCC) 01/10/2023   Gastroesophageal reflux disease without esophagitis 01/06/2023   Presbycusis of both ears 08/02/2022   Arthritis 10/14/2021   Heart murmur 10/14/2021   Hyperlipidemia 10/14/2021   Shingles 10/14/2021   Subdural hematoma (HCC) 11/12/2019   Hypertension 08/09/2019   Stage 3a chronic kidney disease (HCC) 08/09/2019   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   In Pre-Arrest Conditions (Patient Is Breathing and Has A Pulse) Do not intubate. Provide all appropriate non-invasive medical interventions. Avoid ICU transfer unless indicated or required.   Consent: Discussion documented in EHR or advanced directives reviewed      12/12/23 1723           Code Status History     Date Active Date Inactive Code Status Order ID Comments User Context   09/16/2023 1439 09/20/2023 2137 Limited: Do not attempt resuscitation (DNR) -DNR-LIMITED -Do Not Intubate/DNI  [id number]  Laurita Cort DASEN, MD ED   01/25/2023 2044 09/16/2023 1208 DNR [id number]  Abdul Fine, MD Outpatient   01/16/2023 1515 01/25/2023 1713 DNR [id number]  Eldonna Elspeth PARAS, MD ED   01/10/2023 1701 01/11/2023 2044 DNR [id number]  Darci Pore, MD ED   01/10/2023 1636 01/10/2023 1700 Full Code [id number]  Darci Pore, MD ED       Thank you for allowing the Palliative Medicine Team to assist in the care of this patient.  Camelia Lewis, NP  Please contact Palliative Medicine Team phone at [phone] for questions and concerns.

## 2023-12-14 NOTE — Care Management Important Message (Signed)
 Important Message  Patient Details  Name: MARGURETE GUAMAN MRN: [id number] Date of Birth: 16-Oct-1925   Important Message Given:  Yes - Medicare IM     Rojelio SHAUNNA Rattler 12/14/2023, 1:01 PM

## 2023-12-14 NOTE — Plan of Care (Signed)
  Problem: Education: Goal: Knowledge of General Education information will improve Description: Including pain rating scale, medication(s)/side effects and non-pharmacologic comfort measures Outcome: Progressing   Problem: Health Behavior/Discharge Planning: Goal: Ability to manage health-related needs will improve Outcome: Progressing   Problem: Clinical Measurements: Goal: Will remain free from infection Outcome: Progressing Goal: Respiratory complications will improve Outcome: Progressing Goal: Cardiovascular complication will be avoided Outcome: Progressing   Problem: Activity: Goal: Risk for activity intolerance will decrease Outcome: Progressing   Problem: Nutrition: Goal: Adequate nutrition will be maintained Outcome: Progressing   Problem: Elimination: Goal: Will not experience complications related to bowel motility Outcome: Progressing Goal: Will not experience complications related to urinary retention Outcome: Progressing   Problem: Pain Managment: Goal: General experience of comfort will improve and/or be controlled Outcome: Progressing   Problem: Safety: Goal: Ability to remain free from injury will improve Outcome: Progressing

## 2023-12-14 NOTE — Progress Notes (Signed)
 [facility] Liaison Note  Received request from [facility], [name], [place] , Transitions of Care Manager, for hospice services at home after discharge.  Spoke with patient and caregiver to initiate education related to hospice philosophy, services, and team approach to care.  Patient and caregiver verbalized understanding of information given.  Per discussion, the plan is for discharge home today.   DME needs discussed.  Patient has the following equipment in the home:  walker, WC, BSC, shower chair and electric recliner.   Patient/family requests the following equipment in the home: hospital bed and over the bed table.   The address has been verified and is correct in the chart. Judy [name] and phone number [phone] is the family contact to arrange time of equipment delivery.    Please send signed and completed DNR home with the patient/family.  Please provide prescriptions at discharge as needed to ensure ongoing symptom management.   [facility] information and contact numbers given to patient and caregiver.   Above information shared with [name], LCSW, Transitions of Care Manager.     Please call with any Hospice related questions or concerns.  Thank you for the opportunity to participate in this patient's care.  Judy [name], [facility] Liaison [phone]

## 2023-12-14 NOTE — Discharge Summary (Signed)
 Physician Discharge Summary   Patient: Judy [name] MRN: [id number] DOB: 08-Aug-1925  Admit date:     12/12/2023  Discharge date: 12/14/23  Discharge Physician: Sasan Wilkie   PCP: Patient, No Pcp Per   Recommendations at discharge:   Patient will be discharged home with hospice Discharge home with Foley catheter   Discharge Diagnoses: Principal Problem:   Emphysematous cystitis Active Problems:   Lower GI bleed   Protein-calorie malnutrition, severe   ABLA (acute blood loss anemia)   Pressure injury of skin  Resolved Problems:   * No resolved hospital problems. *  Hospital Course:  Judy [name] is a 89 y.o. female with medical history significant for hypertension, history of traumatic subdural hematoma, history of CHF who presents to the emergency room with her healthcare power of attorney for evaluation of dark stools.  Patient notes that she has had several episodes of dark stools sometimes containing bright red blood and so she came into the ER for evaluation.  States that she has had a recent admission for GI bleed and at that time she had a hemoglobin of 4 requiring blood transfusion.  Patient declined endoscopic evaluation during that hospitalization. She had a CT angiogram done for evaluation of her GI bleed and it showed gas throughout the wall of the bladder consistent with emphysematous cystitis. Gas within the ventral retroperitoneal space superior to the bladder and adjacent to the LEFT operator space most consistent with bladder perforation. Cholelithiasis without evidence of cholecystitis. Sigmoid diverticulosis without evidence of diverticulitis. 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 Emphysema  She complains of urinary frequency and urgency but denies having any abdominal pain, no hematuria, no fever, no chills, no cough, no shortness of breath, no chest pain, no shortness of breath or any focal deficit. Abnormal labs  include a white count of 16.7, platelet count of 512, BUN of 37, bicarb of 18 ER physician discussed CT scan findings with urology on-call who recommended IV antibiotics and Foley catheter to decompress the bladder.  Official consult to be done in a.m. She received a dose of vancomycin , Flagyl  and meropenem and will be admitted to the hospital for further evaluation   Assessment and Plan:   Emphysematous cystitis:  with possible bladder perforation as per CT.  Urine cx is sterile.  Patient was placed on IV Rocephin  and will be discharged on cefuroxime to complete a 2-week course of therapy Continue w/ foley x 2 weeks, outpatient cystogram and abxs x 10-14 days as per urology.    Acute blood loss anemia Lower GI bleed:  with dark stools occasionally mixed with bright red blood.  Pt does NOT want any invasive procedures so GI consult was deferred at this time.  She was transfused 1 unit of packed RBC with improvement in her H&H   Severe protein-calorie malnutrition:  Continue with oral nutritional supplements.  She has been added   Failure to thrive:  Appreciate palliative care input.  Patient will be discharged home with hospice         Consultants: Palliative care Procedures performed: Foley catheter insertion Disposition: Hospice care Diet recommendation:  Discharge Diet Orders (From admission, onward)     Start     Ordered   12/14/23 0000  Diet general        12/14/23 1357           Regular diet DISCHARGE MEDICATION: Allergies as of 12/14/2023   No Known Allergies  Medication List     TAKE these medications    acetaminophen  500 MG tablet Commonly known as: TYLENOL  Take 2 tablets (1,000 mg total) by mouth every 8 (eight) hours as needed.   cefUROXime 500 MG tablet Commonly known as: CEFTIN Take 1 tablet (500 mg total) by mouth 2 (two) times daily with a meal for 12 days.   celecoxib 100 MG capsule Commonly known as: CELEBREX Take 100 mg by  mouth 2 (two) times daily.   cholecalciferol  25 MCG (1000 UNIT) tablet Commonly known as: VITAMIN D3 Take 1 tablet (25 mcg total) by mouth daily.   diclofenac  Sodium 1 % Gel Commonly known as: VOLTAREN  Apply 2 g topically every 6 (six) hours as needed.   DuoDERM CGF Dressing Misc Apply 1 each topically 3 (three) times a week.   feeding supplement Liqd Take 237 mLs by mouth 3 (three) times daily between meals.   ferrous gluconate  324 MG tablet Commonly known as: FERGON Take 1 tablet (324 mg total) by mouth 2 (two) times daily with a meal.   loratadine  10 MG tablet Commonly known as: CLARITIN  Take 1 tablet (10 mg total) by mouth daily.   magnesium  oxide 400 (240 Mg) MG tablet Commonly known as: MAG-OX Take 1 tablet (400 mg total) by mouth 2 (two) times daily.   multivitamin with minerals Tabs tablet Take 1 tablet by mouth daily.   oxybutynin  5 MG tablet Commonly known as: DITROPAN  Take 5 mg by mouth 2 (two) times daily.   pantoprazole  40 MG tablet Commonly known as: Protonix  Take 1 tablet (40 mg total) by mouth 2 (two) times daily.   tiZANidine 2 MG tablet Commonly known as: ZANAFLEX Take 2 mg by mouth at bedtime.   traMADol  50 MG tablet Commonly known as: ULTRAM  Take 0.5 tablets (25 mg total) by mouth every 8 (eight) hours as needed.   vitamin E  180 MG (400 UNITS) capsule Take 400 Units by mouth daily.        Discharge Exam: Filed Weights   12/12/23 1231  Weight: 56.2 kg   Constitutional:      Appearance: Normal appearance.     Comments: Chronically ill-appearing  HENT:     Head: Normocephalic and atraumatic.     Nose: Nose normal.     Mouth/Throat:     Mouth: Mucous membranes are moist.    Eyes:     Comments: Pale conjunctiva    Cardiovascular:     Rate and Rhythm: Normal rate and regular rhythm.     Heart sounds: Murmur heard.  Pulmonary:     Effort: Pulmonary effort is normal.     Breath sounds: Normal breath sounds.  Abdominal:      General: Abdomen is flat.     Palpations: Abdomen is soft.    Musculoskeletal:        General: Deformity present.     Cervical back: Normal range of motion and neck supple.     Comments: Deformity left knee    Skin:    General: Skin is warm and dry.    Neurological:     Mental Status: She is alert and oriented to person, place, and time.     Motor: Weakness present.    Psychiatric:        Mood and Affect: Mood normal.        Behavior: Behavior normal.          Condition at discharge: stable  The results of significant diagnostics from this  hospitalization (including imaging, microbiology, ancillary and laboratory) are listed below for reference.   Imaging Studies: CT ABDOMEN PELVIS W CONTRAST Addendum Date: 12/12/2023 ADDENDUM REPORT: 12/12/2023 15:59 ADDENDUM: Findings conveyed to Dr Sung 12/12/2023  at15:31. Electronically Signed   By: Jackquline Boxer M.D.   On: 12/12/2023 15:59   Result Date: 12/12/2023 CLINICAL DATA:  Abdominal pain.  Acute nonlocalized EXAM: CT ABDOMEN AND PELVIS WITH CONTRAST TECHNIQUE: Multidetector CT imaging of the abdomen and pelvis was performed using the standard protocol following bolus administration of intravenous contrast. RADIATION DOSE REDUCTION: This exam was performed according to the departmental dose-optimization program which includes automated exposure control, adjustment of the mA and/or kV according to patient size and/or use of iterative reconstruction technique. CONTRAST:  85mL OMNIPAQUE  IOHEXOL  300 MG/ML  SOLN COMPARISON:  None Available. FINDINGS: Lower chest: Lung bases are clear. Hepatobiliary: No focal hepatic lesion. Large gallstone measuring 23 mm in the fundus of the gallbladder. No gallbladder inflammation. No biliary duct dilatation. Common bile duct is normal. Pancreas: Pancreas is normal. No ductal dilatation. No pancreatic inflammation. Spleen: Normal spleen Adrenals/urinary tract: Adrenal glands normal. Hydronephrosis  or ureteral obstruction. Nonenhancing cyst in the LEFT kidney measures 16 mm. Significant streak artifact from adjacent RIGHT hip prosthetic limits exam of the pelvis. There is gas throughout the wall the bladder best seen on coronal image 32/5. There is gas within lumen the bladder. There is gas within the retroperitoneal space superior to the bladder (image 61/2. Extraluminal gas adjacent to the LEFT operator space on image 62/2. Stomach/Bowel: Stomach, small bowel, appendix, and cecum are normal. There is stool within the descending colon. There are diverticula through the sigmoid colon. Colon passes adjacent to the retroperitoneal gas described above. Favor gas from bladder origin rather than colon origin. Vascular/Lymphatic: Abdominal aorta is normal caliber. No periportal or retroperitoneal adenopathy. No pelvic adenopathy. Reproductive: Uterus unremarkable. Other: No free fluid. Musculoskeletal: No aggressive osseous lesion. IMPRESSION: 1. Gas throughout the wall of the bladder consistent with emphysematous cystitis. 2. Gas within the ventral retroperitoneal space superior to the bladder and adjacent to the LEFT operator space most consistent with bladder perforation. 3. Cholelithiasis without evidence of cholecystitis. 4. Sigmoid diverticulosis without evidence of diverticulitis. 5. 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 6.  Emphysema (ICD10-J43.9). Electronically Signed: By: Jackquline Boxer M.D. On: 12/12/2023 15:17    Microbiology: Results for orders placed or performed during the hospital encounter of 12/12/23  Urine Culture (for pregnant, neutropenic or urologic patients or patients with an indwelling urinary catheter)     Status: None   Collection Time: 12/13/23  8:30 AM   Specimen: Urine, Clean Catch  Result Value Ref Range Status   Specimen Description   Final    URINE, CLEAN CATCH Performed at [facility], [address] [place], [place]  [address]    Special Requests   Final    Normal Performed at [facility], [address], [place], [place] [address]    Culture   Final    NO GROWTH Performed at [facility], [address], [place], [place] [address]    Report Status 12/14/2023 FINAL  Final    Labs: CBC: Recent Labs  Lab 12/12/23 1255 12/12/23 1854 12/13/23 0443 12/13/23 1653 12/14/23 0332  WBC 16.7*  --   --   --  9.8  NEUTROABS 13.6*  --   --   --   --   HGB 8.9* 7.9* 6.5*  8.4* 8.1*  HCT 28.2* 26.3* 20.7* 26.7* 24.9*  MCV 101.1*  --   --   --  95.8  PLT 512*  --   --   --  348   Basic Metabolic Panel: Recent Labs  Lab 12/12/23 1255 12/13/23 0443 12/14/23 0332  NA 142 140 140  K 4.0 4.0 3.6  CL 111 113* 112*  CO2 18* 21* 21*  GLUCOSE 159* 97 97  BUN 37* 30* 26*  CREATININE 0.77 0.50 0.65  CALCIUM  8.6* 8.2* 8.5*   Liver Function Tests: Recent Labs  Lab 12/12/23 1255  AST 18  ALT 11  ALKPHOS 56  BILITOT 0.8  PROT 6.1*  ALBUMIN 3.3*   CBG: No results for input(s): GLUCAP in the last 168 hours.  Discharge time spent: greater than 30 minutes.  Signed: Aimee Somerset, MD [facility] 12/14/2023

## 2023-12-14 NOTE — TOC Progression Note (Signed)
 Transition of Care [facility]) - Progression Note    Patient Details  Name: Judy [name] MRN: [id number] Date of Birth: [birth year]  Transition of Care [facility]) CM/SW Contact  [place], [place] Phone Number: 12/14/2023, 1:49 PM  Clinical Narrative:     CSW advised by medical team that patient and family wanted Hospice at home. Lonell Nova contacted to speak with family regarding options. [facility] sending bed and over bed table to the home. Family is okay with discharge today as long as equipment is delivered prior to discharge. Pt has DME at home re: WC, Vannie, BSC, Shower Chair, Medical illustrator.        Expected Discharge Plan and Services                                               Social Determinants of Health (SDOH) Interventions SDOH Screenings   Food Insecurity: No Food Insecurity (12/12/2023)  Housing: Unknown (12/12/2023)  Transportation Needs: No Transportation Needs (12/12/2023)  Utilities: Not At Risk (12/12/2023)  Social Connections: Moderately Isolated (12/12/2023)  Tobacco Use: Low Risk  (12/12/2023)    Readmission Risk Interventions    01/20/2023    9:57 AM  Readmission Risk Prevention Plan  Transportation Screening Complete  PCP or Specialist Appt within 5-7 Days Complete  Home Care Screening Complete  Medication Review (RN CM) Complete

## 2023-12-15 DIAGNOSIS — N3080 Other cystitis without hematuria: Secondary | ICD-10-CM | POA: Diagnosis not present

## 2023-12-15 NOTE — TOC Transition Note (Addendum)
 Transition of Care [facility]) - Discharge Note   Patient Details  Name: Judy [name] MRN: [id number] Date of Birth: 11/11/1925  Transition of Care [facility]) CM/SW Contact:  [place] L [place], LCSW Phone Number: 12/15/2023, 9:13 AM   Clinical Narrative:     CSW arranged transportation for patient.  [facility] is set to pick patient up today before noon.   DME is scheduled to be delivered to the home. Family at bedside and notified of transport.    Final next level of care: Home w Hospice Care     Patient Goals and CMS Choice            Discharge Placement                       Discharge Plan and Services Additional resources added to the After Visit Summary for                                       Social Drivers of Health (SDOH) Interventions SDOH Screenings   Food Insecurity: No Food Insecurity (12/12/2023)  Housing: Unknown (12/12/2023)  Transportation Needs: No Transportation Needs (12/12/2023)  Utilities: Not At Risk (12/12/2023)  Social Connections: Moderately Isolated (12/12/2023)  Tobacco Use: Low Risk  (12/12/2023)     Readmission Risk Interventions    01/20/2023    9:57 AM  Readmission Risk Prevention Plan  Transportation Screening Complete  PCP or Specialist Appt within 5-7 Days Complete  Home Care Screening Complete  Medication Review (RN CM) Complete

## 2023-12-15 NOTE — Plan of Care (Signed)
 Pt stable and discharged to home for home health hospice. Patient IV discontinued. D/C summary reviwed with patient. Patient left with EMS transport to home.    Problem: Education: Goal: Knowledge of General Education information will improve Description: Including pain rating scale, medication(s)/side effects and non-pharmacologic comfort measures Outcome: Progressing   Problem: Health Behavior/Discharge Planning: Goal: Ability to manage health-related needs will improve Outcome: Progressing   Problem: Clinical Measurements: Goal: Ability to maintain clinical measurements within normal limits will improve Outcome: Progressing Goal: Will remain free from infection Outcome: Progressing Goal: Diagnostic test results will improve Outcome: Progressing Goal: Respiratory complications will improve Outcome: Progressing

## 2023-12-15 NOTE — Progress Notes (Signed)
 Progress Note   Patient: Judy [name] FMW:[id number] DOB: 01/03/1926 DOA: 12/12/2023     3 DOS: the patient was seen and examined on 12/15/2023   Brief hospital course:  Judy [name] is a 89 y.o. female with medical history significant for hypertension, history of traumatic subdural hematoma, history of CHF who presents to the emergency room with her healthcare power of attorney for evaluation of dark stools.  Patient notes that she has had several episodes of dark stools sometimes containing bright red blood and so she came into the ER for evaluation.  States that she has had a recent admission for GI bleed and at that time she had a hemoglobin of 4 requiring blood transfusion.  Patient declined endoscopic evaluation during that hospitalization. She had a CT angiogram done for evaluation of her GI bleed and it showed gas throughout the wall of the bladder consistent with emphysematous cystitis. Gas within the ventral retroperitoneal space superior to the bladder and adjacent to the LEFT operator space most consistent with bladder perforation. Cholelithiasis without evidence of cholecystitis. Sigmoid diverticulosis without evidence of diverticulitis. 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 Emphysema  She complains of urinary frequency and urgency but denies having any abdominal pain, no hematuria, no fever, no chills, no cough, no shortness of breath, no chest pain, no shortness of breath or any focal deficit. Abnormal labs include a white count of 16.7, platelet count of 512, BUN of 37, bicarb of 18 ER physician discussed CT scan findings with urology on-call who recommended IV antibiotics and Foley catheter to decompress the bladder.  Official consult to be done in a.m. She received a dose of vancomycin , Flagyl  and meropenem and will be admitted to the hospital for further evaluation    Assessment and Plan:  Emphysematous cystitis:  with possible bladder  perforation as per CT.  Urine cx is sterile.  Patient has been on IV Rocephin  and will be discharged on cefuroxime to complete a 2-week course of therapy Continue w/ foley x 2 weeks, outpatient cystogram and abxs x 10-14 days as per urology.      Acute blood loss anemia Lower GI bleed suspect diverticular bleed with dark stools occasionally mixed with bright red blood.  Pt does NOT want any invasive procedures so GI consult was deferred at this time.  She was transfused 1 unit of packed RBC with improvement in her H&H     Severe protein-calorie malnutrition:  Continue with oral nutritional supplements.     Failure to thrive:  Appreciate palliative care input.  Patient will be discharged home with hospice       Subjective: No new complaints  Physical Exam: Vitals:   12/14/23 1743 12/14/23 1935 12/15/23 0352 12/15/23 0906  BP: (!) 157/73 (!) 131/98 131/69 (!) 143/81  Pulse: 96 90 79 93  Resp: 16 20 20 16   Temp: 97.7 F (36.5 C) 98.5 F (36.9 C) 98 F (36.7 C) 97.7 F (36.5 C)  TempSrc: Oral  Oral Oral  SpO2: 100% 98% 99% 100%  Weight:      Height:       General exam: Appears calm and comfortable  Respiratory system: Clear to auscultation. Respiratory effort normal. Cardiovascular system: S1 & S2 +. No rubs, gallops or clicks Gastrointestinal system: Abdomen is nondistended, soft and nontender.  Normal bowel sounds heard. Central nervous system: Alert and oriented. Moves all extremities.  Generalized weakness Psychiatry: Judgement and insight appear normal. Mood & affect appropriate.  Data Reviewed: Hemoglobin 8.1, hematocrit 24.9 Labs reviewed  Family Communication: Discussed with patient and her healthcare power of attorney and she wishes to be discharged home with hospice  Disposition: Status is: Inpatient Remains inpatient appropriate because: Discharge home with hospice  Planned Discharge Destination: Discharge home with hospice    Time spent:  38 minutes  Author: Aimee Somerset, MD 12/15/2023 10:48 AM  For on call review [email or web address].

## 2023-12-19 ENCOUNTER — Telehealth: Payer: Self-pay | Admitting: Physician Assistant

## 2023-12-29 ENCOUNTER — Other Ambulatory Visit

## 2023-12-29 ENCOUNTER — Ambulatory Visit: Admitting: Urology

## 2024-03-12 ENCOUNTER — Ambulatory Visit: Admitting: Podiatry

## 2024-03-12 VITALS — Ht 66.0 in | Wt 124.0 lb

## 2024-03-12 DIAGNOSIS — B351 Tinea unguium: Secondary | ICD-10-CM | POA: Diagnosis not present

## 2024-03-12 DIAGNOSIS — M79674 Pain in right toe(s): Secondary | ICD-10-CM

## 2024-03-12 DIAGNOSIS — M79675 Pain in left toe(s): Secondary | ICD-10-CM

## 2024-03-13 NOTE — Progress Notes (Signed)
  Subjective:  Patient ID: Judy [name], female    DOB: April 07, 1926,  MRN: [id number]  Severely thickened nails and calluses  89 y.o. female presents with the above complaint. History confirmed with patient.  Nails are quite painful again.  Regular debridement has been helpful.  Has been more bothersome had to reschedule her appointment due to recent hospitalization.  Objective:  Physical Exam: warm, good capillary refill, no trophic changes or ulcerative lesions, normal DP and PT pulses, normal sensory exam, and severe contracted hammertoe and pes planus deformity there is severe onychomycosis and dystrophy of all nails.  Yellow discoloration and subungual debris  Assessment:   1. Pain due to onychomycosis of toenails of both feet      Plan:  Patient was evaluated and treated and all questions answered.  Discussed the etiology and treatment options for the condition in detail with the patient.  Not a good candidate for oral or topical treatment.  Intermittent treatment has been helpful in reducing pain and improving function.. Recommended debridement of the nails today. Sharp and mechanical debridement performed of all painful and mycotic nails today. Nails debrided in length and thickness using a nail nipper to level of comfort. Discussed treatment options including appropriate shoe gear. Follow up as scheduled for painful nails.    Return in about 10 weeks (around 05/21/2024) for painful thick fungal nails.

## 2024-03-21 ENCOUNTER — Ambulatory Visit (INDEPENDENT_AMBULATORY_CARE_PROVIDER_SITE_OTHER): Admitting: Podiatry

## 2024-03-21 VITALS — Ht 66.0 in | Wt 124.0 lb

## 2024-03-21 DIAGNOSIS — L97422 Non-pressure chronic ulcer of left heel and midfoot with fat layer exposed: Secondary | ICD-10-CM

## 2024-03-22 ENCOUNTER — Encounter: Payer: Self-pay | Admitting: Podiatry

## 2024-03-22 MED ORDER — GENTAMICIN SULFATE 0.1 % EX OINT: 1.0000 | TOPICAL_OINTMENT | Freq: Every day | CUTANEOUS | 0 refills | Status: AC

## 2024-03-22 NOTE — Progress Notes (Signed)
  Subjective:  Patient ID: Judy [name], female    DOB: [birth year],  MRN: [id number]  Chief Complaint  Patient presents with   Foot Problem    Rm 1 Patient is here wound on the left heel. Wound is open with moderate drainage.    89 y.o. female presents with the above complaint. History confirmed with patient.  This is a new issue that has recurred she previously had this several years ago  Objective:  Physical Exam: Left foot is warm well-perfused she has a weakly palpable DP and PT pulse, she has a full-thickness ulceration with hyperkeratosis and fibrous wound bed on the lateral heel no cellulitis or purulence or malodor  Assessment:   1. Chronic heel ulcer, left, with fat layer exposed (HCC)      Plan:  Patient was evaluated and treated and all questions answered.  Heel ulcer on the left foot with fat layer exposed, took culture of the wound bed to evaluate for any possible infection today.  I recommended noninvasive vascular testing to evaluate for peripheral vascular disease as a possible cause.  Recommended significant offloading of the heel with pillows so there is no pressure on it.  Iodosorb and silicone foam border dressing applied today.  Rx gentamicin ointment sent to pharmacy utilize this at home daily.  Return in about 3 weeks (around 04/11/2024) for wound care.

## 2024-03-24 LAB — WOUND CULTURE
MICRO NUMBER:: 17073258
SPECIMEN QUALITY:: ADEQUATE

## 2024-04-02 ENCOUNTER — Ambulatory Visit: Payer: Self-pay | Admitting: Podiatry

## 2024-04-02 MED ORDER — SULFAMETHOXAZOLE-TRIMETHOPRIM 800-160 MG PO TABS: 1.0000 | ORAL_TABLET | Freq: Two times a day (BID) | ORAL | 1 refills | Status: AC

## 2024-04-04 ENCOUNTER — Ambulatory Visit: Admitting: Podiatry

## 2024-04-11 ENCOUNTER — Ambulatory Visit (INDEPENDENT_AMBULATORY_CARE_PROVIDER_SITE_OTHER): Admitting: Podiatry

## 2024-04-11 VITALS — Ht 66.0 in | Wt 124.0 lb

## 2024-04-11 DIAGNOSIS — L97422 Non-pressure chronic ulcer of left heel and midfoot with fat layer exposed: Secondary | ICD-10-CM | POA: Diagnosis not present

## 2024-04-12 NOTE — Progress Notes (Signed)
  Subjective:  Patient ID: Judy [name], female    DOB: [birth year],  MRN: [id number]  Chief Complaint  Patient presents with   Wound Check    RM 4 Patient is here for ulcer of the left heel.She feels that it is doing great since taking the antibiotics. Minimal pain.    89 y.o. female presents with the above complaint. History confirmed with patient.     Objective:  Physical Exam: Left foot is warm well-perfused she has a weakly palpable DP and PT pulse, she has a full-thickness ulceration with hyperkeratosis and fibrous wound bed on the lateral heel no cellulitis or purulence or malodor. Improved since last visit   Results for orders placed or performed in visit on 03/21/24  WOUND CULTURE     Status: Abnormal   Collection Time: 03/21/24  4:24 PM   Specimen: Foot, Left; Wound  Result Value Ref Range Status   MICRO NUMBER: [id number]  Final   SPECIMEN QUALITY: Adequate  Final   SOURCE: LEFT HEEL ULCER  Final   STATUS: FINAL  Final   GRAM STAIN:   Final    No white blood cells seen Moderate epithelial cells No organisms seen   ISOLATE 1: methicillin resistant Staphylococcus aureus (A)  Final    Comment: Heavy growth of Methicillin resistant Staphylococcus aureus (MRSA) Negative for inducible clindamycin resistance.      Susceptibility   Methicillin resistant staphylococcus aureus - AEROBIC CULT, GRAM STAIN POSITIVE 1    VANCOMYCIN  1 Sensitive     CIPROFLOXACIN <=0.5 Sensitive     CLINDAMYCIN <=0.25 Sensitive     LEVOFLOXACIN 0.25 Sensitive     ERYTHROMYCIN >=8 Resistant     GENTAMICIN <=0.5 Sensitive     OXACILLIN* NR Resistant      * Oxacillin-resistant staphylococci are resistant to all currently available beta-lactam antimicrobial agents with the possible exception of ceftaroline.     TETRACYCLINE <=1 Sensitive     TRIMETH/SULFA <=10 Sensitive     MOXIFLOXACIN * <=0.25 Sensitive      * Oxacillin-resistant staphylococci are resistant to all currently available  beta-lactam antimicrobial agents with the possible exception of ceftaroline. Legend: S = Susceptible  I = Intermediate R = Resistant  NS = Not susceptible SDD = Susceptible Dose Dependent * = Not Tested  NR = Not Reported **NN = See Therapy Comments        Assessment:   1. Chronic heel ulcer, left, with fat layer exposed (HCC)      Plan:  Patient was evaluated and treated and all questions answered.  Improving on Bactrim. Complete course. Cont gent ointment local wound care. Debrided gently with ring currette today. Iodosorb and bandage applied. F/u in 4 weeks  Return in about 4 weeks (around 05/09/2024) for wound care.

## 2024-05-07 ENCOUNTER — Ambulatory Visit (INDEPENDENT_AMBULATORY_CARE_PROVIDER_SITE_OTHER): Admitting: Podiatry

## 2024-05-07 DIAGNOSIS — M79675 Pain in left toe(s): Secondary | ICD-10-CM

## 2024-05-07 DIAGNOSIS — B351 Tinea unguium: Secondary | ICD-10-CM

## 2024-05-07 DIAGNOSIS — M79674 Pain in right toe(s): Secondary | ICD-10-CM

## 2024-05-07 DIAGNOSIS — L97422 Non-pressure chronic ulcer of left heel and midfoot with fat layer exposed: Secondary | ICD-10-CM | POA: Diagnosis not present

## 2024-05-09 NOTE — Progress Notes (Signed)
  Subjective:  Patient ID: Judy [name], female    DOB: Aug 05, 1925,  MRN: [id number]  Chief Complaint  Patient presents with   Foot Ulcer    4 week follow up Chronic heel ulcer, left,    89 y.o. female presents with the above complaint. History confirmed with patient.   She notes improvement no drainage  Objective:  Physical Exam: Left foot is warm well-perfused she has a weakly palpable DP and PT pulse, she has a full-thickness ulceration with hyperkeratosis and healed lateral heel ulcer.  Bilaterally she has thickened elongated mycotic toenails  Results for orders placed or performed in visit on 03/21/24  WOUND CULTURE     Status: Abnormal   Collection Time: 03/21/24  4:24 PM   Specimen: Foot, Left; Wound  Result Value Ref Range Status   MICRO NUMBER: [id number]  Final   SPECIMEN QUALITY: Adequate  Final   SOURCE: LEFT HEEL ULCER  Final   STATUS: FINAL  Final   GRAM STAIN:   Final    No white blood cells seen Moderate epithelial cells No organisms seen   ISOLATE 1: methicillin resistant Staphylococcus aureus (A)  Final    Comment: Heavy growth of Methicillin resistant Staphylococcus aureus (MRSA) Negative for inducible clindamycin resistance.      Susceptibility   Methicillin resistant staphylococcus aureus - AEROBIC CULT, GRAM STAIN POSITIVE 1    VANCOMYCIN  1 Sensitive     CIPROFLOXACIN <=0.5 Sensitive     CLINDAMYCIN <=0.25 Sensitive     LEVOFLOXACIN 0.25 Sensitive     ERYTHROMYCIN >=8 Resistant     GENTAMICIN  <=0.5 Sensitive     OXACILLIN* NR Resistant      * Oxacillin-resistant staphylococci are resistant to all currently available beta-lactam antimicrobial agents with the possible exception of ceftaroline.     TETRACYCLINE <=1 Sensitive     TRIMETH /SULFA  <=10 Sensitive     MOXIFLOXACIN * <=0.25 Sensitive      * Oxacillin-resistant staphylococci are resistant to all currently available beta-lactam antimicrobial agents with the possible exception of  ceftaroline. Legend: S = Susceptible  I = Intermediate R = Resistant  NS = Not susceptible SDD = Susceptible Dose Dependent * = Not Tested  NR = Not Reported **NN = See Therapy Comments        Assessment:   1. Chronic heel ulcer, left, with fat layer exposed (HCC)   2. Pain due to onychomycosis of toenails of both feet      Plan:  Patient was evaluated and treated and all questions answered.  Wound is essentially healed.  No further wound care needed.  May leave open to air and moisturize skin.  Discussed the etiology and treatment options for the condition in detail with the patient. Recommended debridement of the nails today. Sharp and mechanical debridement performed of all painful and mycotic nails today. Nails debrided in length and thickness using a nail nipper to level of comfort. Follow up as needed for painful nails.    Return in about 3 months (around 08/07/2024) for wound care, RFC.

## 2024-05-23 ENCOUNTER — Ambulatory Visit: Admitting: Podiatry

## 2024-08-13 ENCOUNTER — Ambulatory Visit: Admitting: Podiatry
